# Patient Record
Sex: Female | Born: 1940
Health system: Southern US, Community
[De-identification: ages and names within clinical notes are randomized; demographics above are authoritative.]

## PROBLEM LIST (undated history)

## (undated) DIAGNOSIS — J189 Pneumonia, unspecified organism: Secondary | ICD-10-CM

## (undated) DIAGNOSIS — R011 Cardiac murmur, unspecified: Secondary | ICD-10-CM

## (undated) DIAGNOSIS — F419 Anxiety disorder, unspecified: Secondary | ICD-10-CM

## (undated) DIAGNOSIS — IMO0002 Reserved for concepts with insufficient information to code with codable children: Secondary | ICD-10-CM

## (undated) DIAGNOSIS — R200 Anesthesia of skin: Secondary | ICD-10-CM

## (undated) DIAGNOSIS — E785 Hyperlipidemia, unspecified: Secondary | ICD-10-CM

## (undated) DIAGNOSIS — K635 Polyp of colon: Secondary | ICD-10-CM

## (undated) DIAGNOSIS — F0390 Unspecified dementia without behavioral disturbance: Secondary | ICD-10-CM

## (undated) DIAGNOSIS — Q231 Congenital insufficiency of aortic valve: Secondary | ICD-10-CM

## (undated) DIAGNOSIS — H9190 Unspecified hearing loss, unspecified ear: Secondary | ICD-10-CM

## (undated) DIAGNOSIS — R42 Dizziness and giddiness: Secondary | ICD-10-CM

## (undated) DIAGNOSIS — C4491 Basal cell carcinoma of skin, unspecified: Secondary | ICD-10-CM

## (undated) DIAGNOSIS — I10 Essential (primary) hypertension: Secondary | ICD-10-CM

## (undated) DIAGNOSIS — M1990 Unspecified osteoarthritis, unspecified site: Secondary | ICD-10-CM

## (undated) DIAGNOSIS — Z8669 Personal history of other diseases of the nervous system and sense organs: Secondary | ICD-10-CM

## (undated) DIAGNOSIS — R002 Palpitations: Secondary | ICD-10-CM

## (undated) DIAGNOSIS — H269 Unspecified cataract: Secondary | ICD-10-CM

## (undated) DIAGNOSIS — Q2381 Bicuspid aortic valve: Secondary | ICD-10-CM

## (undated) DIAGNOSIS — H9319 Tinnitus, unspecified ear: Secondary | ICD-10-CM

## (undated) DIAGNOSIS — I351 Nonrheumatic aortic (valve) insufficiency: Secondary | ICD-10-CM

## (undated) DIAGNOSIS — K219 Gastro-esophageal reflux disease without esophagitis: Secondary | ICD-10-CM

## (undated) DIAGNOSIS — K52831 Collagenous colitis: Secondary | ICD-10-CM

## (undated) HISTORY — DX: Palpitations: R00.2

## (undated) HISTORY — PX: TUMOR REMOVAL: SHX12

## (undated) HISTORY — DX: Polyp of colon: K63.5

## (undated) HISTORY — DX: Anesthesia of skin: R20.0

## (undated) HISTORY — DX: Unspecified cataract: H26.9

## (undated) HISTORY — DX: Nonrheumatic aortic (valve) insufficiency: I35.1

## (undated) HISTORY — PX: THYROIDECTOMY, PARTIAL: SHX18

## (undated) HISTORY — DX: Hyperlipidemia, unspecified: E78.5

## (undated) HISTORY — DX: Basal cell carcinoma of skin, unspecified: C44.91

## (undated) HISTORY — DX: Personal history of other diseases of the nervous system and sense organs: Z86.69

## (undated) HISTORY — DX: Cardiac murmur, unspecified: R01.1

## (undated) HISTORY — DX: Tinnitus, unspecified ear: H93.19

## (undated) HISTORY — DX: Reserved for concepts with insufficient information to code with codable children: IMO0002

## (undated) HISTORY — DX: Unspecified hearing loss, unspecified ear: H91.90

## (undated) HISTORY — DX: Essential (primary) hypertension: I10

## (undated) HISTORY — PX: TONSILLECTOMY: SUR1361

## (undated) HISTORY — DX: Anxiety disorder, unspecified: F41.9

---

## 1969-06-23 HISTORY — PX: TUBAL LIGATION: SHX77

## 1998-11-11 ENCOUNTER — Ambulatory Visit (HOSPITAL_COMMUNITY): Admission: RE | Admit: 1998-11-11 | Discharge: 1998-11-11 | Payer: Self-pay | Admitting: Obstetrics and Gynecology

## 1999-12-01 ENCOUNTER — Encounter: Payer: Self-pay | Admitting: Obstetrics and Gynecology

## 1999-12-01 ENCOUNTER — Ambulatory Visit (HOSPITAL_COMMUNITY): Admission: RE | Admit: 1999-12-01 | Discharge: 1999-12-01 | Payer: Self-pay | Admitting: Obstetrics and Gynecology

## 2000-06-14 ENCOUNTER — Encounter: Payer: Self-pay | Admitting: Otolaryngology

## 2000-06-14 ENCOUNTER — Ambulatory Visit (HOSPITAL_COMMUNITY): Admission: RE | Admit: 2000-06-14 | Discharge: 2000-06-14 | Payer: Self-pay | Admitting: Otolaryngology

## 2000-08-20 ENCOUNTER — Other Ambulatory Visit: Admission: RE | Admit: 2000-08-20 | Discharge: 2000-08-20 | Payer: Self-pay | Admitting: Otolaryngology

## 2000-10-23 HISTORY — PX: DILATION AND CURETTAGE OF UTERUS: SHX78

## 2000-10-23 HISTORY — PX: HYSTEROSCOPY: SHX211

## 2001-01-02 ENCOUNTER — Other Ambulatory Visit: Admission: RE | Admit: 2001-01-02 | Discharge: 2001-01-02 | Payer: Self-pay | Admitting: Obstetrics and Gynecology

## 2001-01-09 ENCOUNTER — Encounter: Payer: Self-pay | Admitting: Obstetrics and Gynecology

## 2001-01-09 ENCOUNTER — Ambulatory Visit (HOSPITAL_COMMUNITY): Admission: RE | Admit: 2001-01-09 | Discharge: 2001-01-09 | Payer: Self-pay | Admitting: Obstetrics and Gynecology

## 2001-02-01 ENCOUNTER — Encounter: Payer: Self-pay | Admitting: Anesthesiology

## 2001-02-11 ENCOUNTER — Ambulatory Visit (HOSPITAL_COMMUNITY): Admission: RE | Admit: 2001-02-11 | Discharge: 2001-02-11 | Payer: Self-pay | Admitting: Obstetrics and Gynecology

## 2001-02-11 ENCOUNTER — Encounter (INDEPENDENT_AMBULATORY_CARE_PROVIDER_SITE_OTHER): Payer: Self-pay

## 2001-06-12 ENCOUNTER — Ambulatory Visit (HOSPITAL_COMMUNITY): Admission: RE | Admit: 2001-06-12 | Discharge: 2001-06-12 | Payer: Self-pay | Admitting: Gastroenterology

## 2001-06-12 ENCOUNTER — Encounter (INDEPENDENT_AMBULATORY_CARE_PROVIDER_SITE_OTHER): Payer: Self-pay | Admitting: Specialist

## 2002-01-30 ENCOUNTER — Other Ambulatory Visit: Admission: RE | Admit: 2002-01-30 | Discharge: 2002-01-30 | Payer: Self-pay | Admitting: Obstetrics and Gynecology

## 2002-11-26 ENCOUNTER — Encounter: Payer: Self-pay | Admitting: Internal Medicine

## 2002-11-26 ENCOUNTER — Encounter: Admission: RE | Admit: 2002-11-26 | Discharge: 2002-11-26 | Payer: Self-pay | Admitting: Internal Medicine

## 2002-12-05 ENCOUNTER — Ambulatory Visit (HOSPITAL_COMMUNITY): Admission: RE | Admit: 2002-12-05 | Discharge: 2002-12-05 | Payer: Self-pay | Admitting: Obstetrics and Gynecology

## 2002-12-05 ENCOUNTER — Encounter: Payer: Self-pay | Admitting: Obstetrics and Gynecology

## 2003-02-03 ENCOUNTER — Other Ambulatory Visit: Admission: RE | Admit: 2003-02-03 | Discharge: 2003-02-03 | Payer: Self-pay | Admitting: Obstetrics and Gynecology

## 2003-02-15 ENCOUNTER — Encounter: Payer: Self-pay | Admitting: Emergency Medicine

## 2003-02-15 ENCOUNTER — Emergency Department (HOSPITAL_COMMUNITY): Admission: EM | Admit: 2003-02-15 | Discharge: 2003-02-15 | Payer: Self-pay | Admitting: Emergency Medicine

## 2003-04-01 ENCOUNTER — Encounter: Payer: Self-pay | Admitting: Internal Medicine

## 2003-04-01 ENCOUNTER — Ambulatory Visit (HOSPITAL_COMMUNITY): Admission: RE | Admit: 2003-04-01 | Discharge: 2003-04-01 | Payer: Self-pay | Admitting: Internal Medicine

## 2004-01-05 ENCOUNTER — Ambulatory Visit (HOSPITAL_COMMUNITY): Admission: RE | Admit: 2004-01-05 | Discharge: 2004-01-05 | Payer: Self-pay | Admitting: Obstetrics and Gynecology

## 2004-03-09 ENCOUNTER — Other Ambulatory Visit: Admission: RE | Admit: 2004-03-09 | Discharge: 2004-03-09 | Payer: Self-pay | Admitting: Obstetrics and Gynecology

## 2004-10-23 HISTORY — PX: HYSTEROSCOPY: SHX211

## 2005-01-05 ENCOUNTER — Ambulatory Visit (HOSPITAL_COMMUNITY): Admission: RE | Admit: 2005-01-05 | Discharge: 2005-01-05 | Payer: Self-pay | Admitting: Obstetrics and Gynecology

## 2005-03-21 ENCOUNTER — Ambulatory Visit (HOSPITAL_COMMUNITY): Admission: RE | Admit: 2005-03-21 | Discharge: 2005-03-21 | Payer: Self-pay | Admitting: Cardiovascular Disease

## 2005-03-21 ENCOUNTER — Encounter: Payer: Self-pay | Admitting: Cardiovascular Disease

## 2005-03-24 ENCOUNTER — Other Ambulatory Visit: Admission: RE | Admit: 2005-03-24 | Discharge: 2005-03-24 | Payer: Self-pay | Admitting: Obstetrics and Gynecology

## 2005-06-05 ENCOUNTER — Ambulatory Visit (HOSPITAL_BASED_OUTPATIENT_CLINIC_OR_DEPARTMENT_OTHER): Admission: RE | Admit: 2005-06-05 | Discharge: 2005-06-05 | Payer: Self-pay | Admitting: Obstetrics and Gynecology

## 2005-06-05 ENCOUNTER — Encounter (INDEPENDENT_AMBULATORY_CARE_PROVIDER_SITE_OTHER): Payer: Self-pay | Admitting: Specialist

## 2005-06-05 ENCOUNTER — Ambulatory Visit (HOSPITAL_COMMUNITY): Admission: RE | Admit: 2005-06-05 | Discharge: 2005-06-05 | Payer: Self-pay | Admitting: Obstetrics and Gynecology

## 2005-12-07 ENCOUNTER — Encounter: Admission: RE | Admit: 2005-12-07 | Discharge: 2005-12-07 | Payer: Self-pay | Admitting: Obstetrics and Gynecology

## 2006-01-08 ENCOUNTER — Encounter: Admission: RE | Admit: 2006-01-08 | Discharge: 2006-01-08 | Payer: Self-pay | Admitting: Obstetrics and Gynecology

## 2006-04-10 ENCOUNTER — Other Ambulatory Visit: Admission: RE | Admit: 2006-04-10 | Discharge: 2006-04-10 | Payer: Self-pay | Admitting: Obstetrics & Gynecology

## 2007-03-07 ENCOUNTER — Encounter: Admission: RE | Admit: 2007-03-07 | Discharge: 2007-03-07 | Payer: Self-pay | Admitting: Obstetrics and Gynecology

## 2007-04-24 ENCOUNTER — Other Ambulatory Visit: Admission: RE | Admit: 2007-04-24 | Discharge: 2007-04-24 | Payer: Self-pay | Admitting: Obstetrics and Gynecology

## 2008-04-01 ENCOUNTER — Encounter: Admission: RE | Admit: 2008-04-01 | Discharge: 2008-04-01 | Payer: Self-pay | Admitting: Obstetrics and Gynecology

## 2008-05-15 ENCOUNTER — Other Ambulatory Visit: Admission: RE | Admit: 2008-05-15 | Discharge: 2008-05-15 | Payer: Self-pay | Admitting: Obstetrics and Gynecology

## 2009-04-14 ENCOUNTER — Encounter: Admission: RE | Admit: 2009-04-14 | Discharge: 2009-04-14 | Payer: Self-pay | Admitting: Obstetrics and Gynecology

## 2009-12-21 DIAGNOSIS — IMO0002 Reserved for concepts with insufficient information to code with codable children: Secondary | ICD-10-CM

## 2009-12-21 HISTORY — DX: Reserved for concepts with insufficient information to code with codable children: IMO0002

## 2010-01-10 ENCOUNTER — Ambulatory Visit (HOSPITAL_COMMUNITY): Admission: RE | Admit: 2010-01-10 | Discharge: 2010-01-10 | Payer: Self-pay | Admitting: Internal Medicine

## 2010-04-14 ENCOUNTER — Encounter: Admission: RE | Admit: 2010-04-14 | Discharge: 2010-04-14 | Payer: Self-pay | Admitting: Obstetrics and Gynecology

## 2010-07-11 ENCOUNTER — Encounter: Admission: RE | Admit: 2010-07-11 | Discharge: 2010-07-11 | Payer: Self-pay | Admitting: Diagnostic Neuroimaging

## 2010-07-12 ENCOUNTER — Encounter: Admission: RE | Admit: 2010-07-12 | Discharge: 2010-07-12 | Payer: Self-pay | Admitting: Neurosurgery

## 2010-11-16 ENCOUNTER — Ambulatory Visit: Payer: Self-pay | Admitting: Cardiovascular Disease

## 2011-01-16 ENCOUNTER — Telehealth: Payer: Self-pay | Admitting: *Deleted

## 2011-01-16 NOTE — Telephone Encounter (Signed)
LOWER EXTREMITY EDEMA, SOB x 3 DAYS.  HAS QUESTIONS ABOUT MEDICATION.

## 2011-01-16 NOTE — Telephone Encounter (Signed)
ERROR

## 2011-01-16 NOTE — Telephone Encounter (Signed)
It certainly could be the Amlodipine.  Will she try Losartan 50 mg a day and see if that works better.  Hold Amlodipine for 4-5 days to see if symptoms clear.

## 2011-01-16 NOTE — Telephone Encounter (Signed)
Pt was switched from lisinolpril to norvasc on last visit. Currently having night and day sweats, ankle edema bilateral but right worse than left. Weight is 112 up two lbs from usual, denies sob. Pt feels shaky and just not feeling well. Do you feel it is medication related?

## 2011-01-20 ENCOUNTER — Telehealth: Payer: Self-pay | Admitting: Cardiovascular Disease

## 2011-01-20 NOTE — Telephone Encounter (Signed)
PT STOPPED AMLODIPINE FOR ONE WEEK; NIGHT SWEATS BETTER LEFT FOOT NOT SWOLLEN BUT RIGHT FOOT REMAINS SWOLLEN. PT IS MAKING FOLLOW UP WITH HER PCP AND DECLINES STARTING LOSARTAN 50MG  CURRENTLY. WILL DISCUSS W/ PCP IF TO START. PT TO MAKE APP TODAY AND VERBALIS ED UNDERSTANDING. Alfonso Ramus RN

## 2011-03-10 NOTE — Op Note (Signed)
[facility]  Patient:    Bailey [name], Bailey [name]                     MRN: [id number] Proc. Date: 02/11/01 Adm. Date:  [id number] Attending:  Brynda Peon                           Operative Report  PREOPERATIVE DIAGNOSES:  Postmenopausal bleeding, known submucosal myoma.  POSTOPERATIVE DIAGNOSES:  Postmenopausal bleeding, known submucosal myoma.  PATHOLOGY:  Pending.  PROCEDURE:  Hysteroscopy, removal of submucous myoma and D&C.  SURGEON:  Dr. Amedeo Kinsman.  ANESTHESIA:  General by LMA.  ESTIMATED BLOOD LOSS:  Minimal.  FLUID DEFICIT:  60 cc.  COMPLICATIONS:  None.  DESCRIPTION OF PROCEDURE:  The patient was taken to the operating room and after the induction of adequate general anesthesia by LMA was placed in the dorsal lithotomy position and prepped and draped in the usual fashion and Foley catheter and red rubber catheter inserted and removed. The cervix was grasped at the anterior lip with a single tooth tenaculum. The uterus sounded to 8.5 cm. The cervix was dilated serially with Shawnie Pons dilators, #31 Shawnie Pons. The operative resectoscope was inserted, sorbitol was used as a distention medium. The fluid pressure was set on 70 mm. There was an anterior intrauterine mass consistent with a myoma. This was removed with the resectoscope and there were several other polypoid structures that were also removed with the resectoscope. There was some indentation into the endometrium anteriorly with an apparent intramural myoma. This was not removed. Gentle curettage was carried out and a specimen sent to pathology. Hysteroscopy was repeated. There was no significant bleeding in the cavity. It appeared clean and the procedure was terminated. The resectoscope was removed. The tenaculum was removed from the cervix and the procedure was terminated. The patient tolerated the procedure well and went in fasted condition to post anesthesia recovery. DD:   02/11/01 TD:  02/12/01 Job: [id number]

## 2011-03-10 NOTE — Procedures (Signed)
[facility]  Patient:    Bailey [name], Bailey [name] Visit Number: [id number] MRN: [id number]          Service Type: END Location: ENDO Attending Physician:  Rich Brave Proc. Date: 06/12/01 Adm. Date:  06/12/2001   CC:         Rodrigo Ran, M.D.   Procedure Report  PROCEDURE:  Colonoscopy with biopsy.  INDICATION:  This is a 70 year old female with a family history of colorectal neoplasia (precancerous polyp in her brother), who had a normal screening colonoscopy by me approximately six years ago.  FINDINGS:  Significant sigmoid diverticulosis and fixation.  Diminutive proximal colon polyp removed by cold biopsy technique.  DESCRIPTION OF PROCEDURE:  The nature, purpose, and risks of the procedure were familiar to the patient from prior examination.  She provided written consent.  Sedation was fentanyl 75 mcg and Versed 7 mg IV without arrhythmias or desaturation.  The Olympus adult video colonoscope was used for this procedure, but it was difficult to negotiate the sigmoid region, so the adjustable-tension pediatric scope might be better for future use.  It was advanced through a very fixated, angulated sigmoid region, associated with quite a bit of diverticular disease and muscular thickening, and then fairly easily the remainder of the way around the colon, taking out loops as we advanced, with the patient in the supine position through almost the entire exam.  The cecum was identified by visualization of the appendiceal orifice, and pullback was then performed. The quality of the prep was excellent, and it is felt that all areas were well seen.  There was a small, 2-3 mm sessile polyp in the ascending colon removed by two cold biopsies.  No other polyps were seen, and there was no evidence of cancer, colitis, or vascular malformations.  There was significant sigmoid diverticulosis with associated fixation, as noted above.  Retroflexion  could not be accomplished in the rectum, but antegrade viewing disclosed no distal rectal lesions.  The patient tolerated the procedure well, and there were no apparent complications.  IMPRESSION: 1. Solitary diminutive sessile polyp removed by cold biopsy technique. 2. Significant sigmoid fixation and associated diverticular disease.  PLAN:  Await pathology on the polyp but, in view of the patients family history, [name] feel follow-up colonoscopy is warranted in five years regardless of the polyp histology. Attending Physician:  Rich Brave DD:  06/12/01 TD:  06/12/01 Job: [phone]

## 2011-03-10 NOTE — Op Note (Signed)
NAMESHERRYN, POLLINO              ACCOUNT NO.:  [id number]   MEDICAL RECORD NO.:  [id number]          PATIENT TYPE:  AMB   LOCATION:  [facility]                         FACILITY:  [facility]   PHYSICIAN:  Cynthia P. Romine, M.D.DATE OF BIRTH:  April 27, 1941   DATE OF PROCEDURE:  06/05/2005  DATE OF DISCHARGE:                                 OPERATIVE REPORT   PREOPERATIVE DIAGNOSIS:  Post menopausal bleeding, endometrial polyps.   POSTOPERATIVE DIAGNOSIS:  Post menopausal bleeding, endometrial polyps.  Path pending.   PROCEDURE:  Hysteroscopic resection of endometrial polyps.  C&C.   SURGEON:  Cynthia P. Romine, M.D.   ANESTHESIA:  General by LMA.   ESTIMATED BLOOD LOSS:  Minimal.   SORBITOL DEFICIT:  30 mL.   COMPLICATIONS:  None.   PROCEDURE:  The patient was taken to the operating room and after the  induction of adequate general anesthesia was placed in the dorsal lithotomy  position and prepped and draped in the usual fashion.  The bladder was  drained with a red rubber catheter.  The posterior weighted and anterior  Sims retractors were placed, and the cervix was grasped on its anterior lip  with a single-tooth tenaculum.  The sound would not pass easily.  Therefore,  the 11 Shawnie Pons was lubricated with some K-Y jelly and the cavity was explored  and it did pass.  The cavity was retroverted and approximately 8 cm.  The  cervix was then dilated sequentially to a #31 Pratt.  The operative  hysteroscope was introduced.  Sorbitol was used as the distention medium.  The pressure pump was set on 70 mmHg.  Hysteroscopy revealed there to be an  anterior fundal polyp.  The rest of the cavity appeared clear.  The single  loop was used to excise the polyp in several passes.  The scope was  withdrawn.  Sharp curettage was then carried out.  The specimen was sent to  pathology, and the procedure was terminated.  The instruments removed from  the vagina. The patient was taken to recovery room in  satisfactory  condition.       CPR/MEDQ  D:  06/05/2005  T:  06/05/2005  Job:  [id number]

## 2011-04-03 ENCOUNTER — Other Ambulatory Visit: Payer: Self-pay | Admitting: Obstetrics and Gynecology

## 2011-04-03 DIAGNOSIS — Z1231 Encounter for screening mammogram for malignant neoplasm of breast: Secondary | ICD-10-CM

## 2011-04-17 ENCOUNTER — Ambulatory Visit
Admission: RE | Admit: 2011-04-17 | Discharge: 2011-04-17 | Disposition: A | Discharge status: Home / Self Care | Payer: PRIVATE HEALTH INSURANCE | Source: Ambulatory Visit | Attending: Obstetrics and Gynecology | Admitting: Obstetrics and Gynecology

## 2011-04-17 DIAGNOSIS — Z1231 Encounter for screening mammogram for malignant neoplasm of breast: Secondary | ICD-10-CM

## 2012-01-15 ENCOUNTER — Encounter: Payer: Self-pay | Admitting: *Deleted

## 2012-02-13 ENCOUNTER — Other Ambulatory Visit: Payer: Self-pay | Admitting: Gastroenterology

## 2012-02-27 ENCOUNTER — Ambulatory Visit (INDEPENDENT_AMBULATORY_CARE_PROVIDER_SITE_OTHER): Payer: Medicare Other | Admitting: Cardiovascular Disease

## 2012-02-27 ENCOUNTER — Encounter: Payer: Self-pay | Admitting: Cardiovascular Disease

## 2012-02-27 VITALS — BP 144/79 | HR 61 | Ht 63.5 in | Wt 112.1 lb

## 2012-02-27 DIAGNOSIS — I10 Essential (primary) hypertension: Secondary | ICD-10-CM

## 2012-02-27 DIAGNOSIS — Q2381 Bicuspid aortic valve: Secondary | ICD-10-CM

## 2012-02-27 DIAGNOSIS — Q231 Congenital insufficiency of aortic valve: Secondary | ICD-10-CM

## 2012-02-27 DIAGNOSIS — E785 Hyperlipidemia, unspecified: Secondary | ICD-10-CM | POA: Insufficient documentation

## 2012-02-27 MED ORDER — HYDROCHLOROTHIAZIDE 25 MG PO TABS: 25.0000 mg | ORAL_TABLET | Freq: Every day | ORAL | Status: DC

## 2012-02-27 MED ORDER — POTASSIUM CHLORIDE CRYS ER 10 MEQ PO TBCR: 10.0000 meq | EXTENDED_RELEASE_TABLET | Freq: Every day | ORAL | Status: DC

## 2012-02-27 NOTE — Assessment & Plan Note (Signed)
She's currently on atorvastatin. Her levels have been followed by Dr. Waynard Edwards. She'll bring her labs with her at her next visit.

## 2012-02-27 NOTE — Assessment & Plan Note (Signed)
Bailey [name]'s Blood pressure is mildly elevated. We will change her Lasix to HCTZ 25 mg a day. We will also add potassium chloride 10 mEq a day. We'll recheck her labs again in one month.

## 2012-02-27 NOTE — Patient Instructions (Addendum)
Your physician wants you to follow-up in: 1 year You will receive a reminder letter in the mail two months in advance. If you don't receive a letter, please call our office to schedule the follow-up appointment.  Your physician recommends that you return for lab work in: 1 month/ BMET  Your physician has requested that you have an echocardiogram. Echocardiography is a painless test that uses sound waves to create images of your heart. It provides your doctor with information about the size and shape of your heart and how well your heart's chambers and valves are working. This procedure takes approximately one hour. There are no restrictions for this procedure.   Your physician has recommended you make the following change in your medication:   STOP LASIX START HCTZ 25MG  DAILY START POTASSIUM/KDUR 10 MEQ DAILY WITH THE HCTZ

## 2012-02-27 NOTE — Progress Notes (Signed)
    Bailey [name] Date of Birth  Jun 25, 1941       [facility]    [place] [address]  [address] [place], [place]  [address]   [place], [place]  [address] [phone]     [phone]   Fax  [phone]    Fax [phone]  Problem List: 1. Bicuspid aortic valve / moderate aortic insufficiency 2. Dyslipidemia 3. Hypertension  History of Present Illness:  Bailey [name] is a 71 y.o. female with the above medical problems.  She has had chronic diarrhea and was found to have colitis. She is taking some medications and is getting better.  She is exercising some.  She has been spending a lot of time at the nursing home taking care of her mother. She's not been able to exercise as much she would like.  She's not had any cardiac problems doing her normal daily activities.    Current Outpatient Prescriptions on File Prior to Visit  Medication Sig Dispense Refill  . atorvastatin (LIPITOR) 10 MG tablet Take 10 mg by mouth daily.       . furosemide (LASIX) 20 MG tablet Take 20 mg by mouth daily.      . multivitamin (THERAGRAN) per tablet Take 1 tablet by mouth daily.      Marland Kitchen olmesartan (BENICAR) 40 MG tablet Take 40 mg by mouth daily.        Allergies  Allergen Reactions  . Ibuprofen Nausea And Vomiting    Past Medical History  Diagnosis Date  . Hypertension   . Hyperlipidemia   . Aortic insufficiency     Past Surgical History  Procedure Date  . Tubal ligation   . 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 Never Smoker   Smokeless tobacco  . Not on file    History  Alcohol Use No    Family History  Problem Relation Age of Onset  . Arrhythmia Mother   . Bone cancer Father     Reviw of Systems:  Reviewed in the HPI.  All other systems are negative.  Physical Exam: Blood pressure 144/79, pulse 61, height 5' 3.5" (1.613 m), weight 112 lb 1.9 oz (50.857 kg). General: Well developed, well  nourished, in no acute distress.  Head: Normocephalic, atraumatic, sclera non-icteric, mucus membranes are moist,   Neck: Supple. Carotids are 2 + without bruits. No JVD  Lungs: Clear bilaterally to auscultation.  Heart: regular rate.  normal  S1 S2.  There is a 1-2/6 systolic murmur at the LSB.  Abdomen: Soft, non-tender, non-distended with normal bowel sounds. No hepatomegaly. No rebound/guarding. Her abdominal aorta is palpable in her midline.  Msk:  Strength and tone are normal  Extremities: No clubbing or cyanosis. No edema.  Distal pedal pulses are 2+ and equal bilaterally.  Neuro: Alert and oriented X 3. Moves all extremities spontaneously.  Psych:  Responds to questions appropriately with a normal affect.  ECG: Feb 27, 2012-normal sinus rhythm at 64 beats a minute. She has an incomplete right bundle branch block. She has poor R-wave progression that is likely due to lead placement.  Assessment / Plan:

## 2012-02-27 NOTE — Assessment & Plan Note (Addendum)
Bailey [name] has a bicuspid aortic valve. We diagnosed this by echocardiogram in 2006. She has at least mild to moderate aortic insufficiency.  We will repeat her echocardiogram. She's completely asymptomatic. I'll see her again in one year.  We have performed an abdominal ultrasound in the past. I do not have that report but it did not show any evidence of aneurysm. I suspect that her palpable abdominal aorta is just because she is so thin.

## 2012-03-01 ENCOUNTER — Other Ambulatory Visit: Payer: Self-pay

## 2012-03-01 ENCOUNTER — Ambulatory Visit (HOSPITAL_COMMUNITY): Payer: Medicare Other | Attending: Cardiovascular Disease

## 2012-03-01 DIAGNOSIS — I059 Rheumatic mitral valve disease, unspecified: Secondary | ICD-10-CM | POA: Insufficient documentation

## 2012-03-01 DIAGNOSIS — Q231 Congenital insufficiency of aortic valve: Secondary | ICD-10-CM | POA: Insufficient documentation

## 2012-03-01 DIAGNOSIS — I359 Nonrheumatic aortic valve disorder, unspecified: Secondary | ICD-10-CM | POA: Insufficient documentation

## 2012-03-01 DIAGNOSIS — E785 Hyperlipidemia, unspecified: Secondary | ICD-10-CM | POA: Insufficient documentation

## 2012-03-01 DIAGNOSIS — I10 Essential (primary) hypertension: Secondary | ICD-10-CM | POA: Insufficient documentation

## 2012-03-01 DIAGNOSIS — Q2381 Bicuspid aortic valve: Secondary | ICD-10-CM

## 2012-03-13 ENCOUNTER — Emergency Department (HOSPITAL_COMMUNITY): Payer: Medicare Other

## 2012-03-13 ENCOUNTER — Inpatient Hospital Stay (HOSPITAL_COMMUNITY)
Admission: EM | Admit: 2012-03-13 | Discharge: 2012-03-15 | DRG: 149 | Disposition: A | Discharge status: Home / Self Care | Payer: Medicare Other | Attending: Internal Medicine | Admitting: Internal Medicine

## 2012-03-13 ENCOUNTER — Encounter (HOSPITAL_COMMUNITY): Payer: Self-pay | Admitting: *Deleted

## 2012-03-13 DIAGNOSIS — G43909 Migraine, unspecified, not intractable, without status migrainosus: Secondary | ICD-10-CM | POA: Diagnosis present

## 2012-03-13 DIAGNOSIS — N179 Acute kidney failure, unspecified: Secondary | ICD-10-CM | POA: Diagnosis present

## 2012-03-13 DIAGNOSIS — I10 Essential (primary) hypertension: Secondary | ICD-10-CM | POA: Diagnosis present

## 2012-03-13 DIAGNOSIS — E785 Hyperlipidemia, unspecified: Secondary | ICD-10-CM | POA: Diagnosis present

## 2012-03-13 DIAGNOSIS — R42 Dizziness and giddiness: Principal | ICD-10-CM | POA: Diagnosis present

## 2012-03-13 DIAGNOSIS — Q231 Congenital insufficiency of aortic valve: Secondary | ICD-10-CM

## 2012-03-13 HISTORY — DX: Congenital insufficiency of aortic valve: Q23.1

## 2012-03-13 HISTORY — DX: Dizziness and giddiness: R42

## 2012-03-13 HISTORY — DX: Bicuspid aortic valve: Q23.81

## 2012-03-13 LAB — DIFFERENTIAL
Basophils Absolute: 0 10*3/uL (ref 0.0–0.1)
Basophils Relative: 0 % (ref 0–1)
Eosinophils Relative: 2 % (ref 0–5)
Lymphocytes Relative: 19 % (ref 12–46)
Neutro Abs: 6.3 10*3/uL (ref 1.7–7.7)

## 2012-03-13 LAB — CBC
MCHC: 34 g/dL (ref 30.0–36.0)
Platelets: 153 10*3/uL (ref 150–400)
RDW: 13 % (ref 11.5–15.5)
WBC: 9 10*3/uL (ref 4.0–10.5)

## 2012-03-13 LAB — POCT I-STAT, CHEM 8
BUN: 24 mg/dL — ABNORMAL HIGH (ref 6–23)
Chloride: 102 mEq/L (ref 96–112)
HCT: 32 % — ABNORMAL LOW (ref 36.0–46.0)
Sodium: 140 mEq/L (ref 135–145)
TCO2: 29 mmol/L (ref 0–100)

## 2012-03-13 MED ORDER — ONDANSETRON HCL 4 MG/2ML IJ SOLN
INTRAMUSCULAR | Status: AC
  Administered 2012-03-13: 22:00:00
  Filled 2012-03-13: qty 2

## 2012-03-13 MED ORDER — DIAZEPAM 5 MG/ML IJ SOLN
5.0000 mg | Freq: Once | INTRAMUSCULAR | Status: AC
  Administered 2012-03-13: 5 mg via INTRAVENOUS
  Filled 2012-03-13: qty 2

## 2012-03-13 NOTE — ED Notes (Signed)
Pt now c/o RLQ abdominal pain, does not increase with palpation.  Denies nausea at this time, no painful urination or burning.  Diarrhea for 3 months with hx of colitis.

## 2012-03-13 NOTE — ED Notes (Signed)
Per EMS: pt was dizzy and vomited 1 time yesterday.  Tonight, pt felt very dizzy and vomited x 2.  Pt is hypertensive with hx of HTN.  Also has hx of vertigo, states this feels slightly different.  4 mg Zofran given.  Vomiting with movement.

## 2012-03-13 NOTE — ED Notes (Signed)
Pt ambulated with 1 staff assist.  No nausea, but pt did feel dizzy when ambulating, needed to "catch my balance" several times.

## 2012-03-14 ENCOUNTER — Encounter (HOSPITAL_COMMUNITY): Payer: Self-pay | Admitting: Family Medicine

## 2012-03-14 ENCOUNTER — Inpatient Hospital Stay (HOSPITAL_COMMUNITY): Payer: Medicare Other

## 2012-03-14 DIAGNOSIS — G43909 Migraine, unspecified, not intractable, without status migrainosus: Secondary | ICD-10-CM

## 2012-03-14 DIAGNOSIS — E782 Mixed hyperlipidemia: Secondary | ICD-10-CM

## 2012-03-14 DIAGNOSIS — R42 Dizziness and giddiness: Secondary | ICD-10-CM | POA: Diagnosis present

## 2012-03-14 DIAGNOSIS — I10 Essential (primary) hypertension: Secondary | ICD-10-CM

## 2012-03-14 LAB — URINALYSIS, ROUTINE W REFLEX MICROSCOPIC
Glucose, UA: NEGATIVE mg/dL
Ketones, ur: NEGATIVE mg/dL
Leukocytes, UA: NEGATIVE
Nitrite: NEGATIVE
Protein, ur: NEGATIVE mg/dL

## 2012-03-14 LAB — CBC
HCT: 34.3 % — ABNORMAL LOW (ref 36.0–46.0)
HCT: 33.1 % — ABNORMAL LOW (ref 36.0–46.0)
Hemoglobin: 11.4 g/dL — ABNORMAL LOW (ref 12.0–15.0)
MCHC: 33.5 g/dL (ref 30.0–36.0)
MCV: 90.7 fL (ref 78.0–100.0)
Platelets: 149 10*3/uL — ABNORMAL LOW (ref 150–400)
RBC: 3.65 MIL/uL — ABNORMAL LOW (ref 3.87–5.11)
RDW: 12.9 % (ref 11.5–15.5)
RDW: 12.9 % (ref 11.5–15.5)
WBC: 7.3 10*3/uL (ref 4.0–10.5)
WBC: 6.9 10*3/uL (ref 4.0–10.5)

## 2012-03-14 LAB — BASIC METABOLIC PANEL
CO2: 27 mEq/L (ref 19–32)
Chloride: 102 mEq/L (ref 96–112)
Creatinine, Ser: 0.73 mg/dL (ref 0.50–1.10)

## 2012-03-14 LAB — CREATININE, SERUM
Creatinine, Ser: 0.85 mg/dL (ref 0.50–1.10)
GFR calc Af Amer: 79 mL/min — ABNORMAL LOW (ref >90)
GFR calc non Af Amer: 68 mL/min — ABNORMAL LOW (ref >90)

## 2012-03-14 MED ORDER — MECLIZINE HCL 25 MG PO TABS
25.0000 mg | ORAL_TABLET | Freq: Three times a day (TID) | ORAL | Status: DC | PRN
  Filled 2012-03-14: qty 1

## 2012-03-14 MED ORDER — IRBESARTAN 150 MG PO TABS
150.0000 mg | ORAL_TABLET | Freq: Every day | ORAL | Status: DC
  Administered 2012-03-14: 150 mg via ORAL
  Filled 2012-03-14: qty 1

## 2012-03-14 MED ORDER — ALUM & MAG HYDROXIDE-SIMETH 200-200-20 MG/5ML PO SUSP: 30.0000 mL | Freq: Four times a day (QID) | ORAL | Status: DC | PRN

## 2012-03-14 MED ORDER — AMLODIPINE BESYLATE 5 MG PO TABS
5.0000 mg | ORAL_TABLET | Freq: Every day | ORAL | Status: DC
  Administered 2012-03-14 – 2012-03-15 (×2): 5 mg via ORAL
  Filled 2012-03-14 (×2): qty 1

## 2012-03-14 MED ORDER — ENOXAPARIN SODIUM 30 MG/0.3ML ~~LOC~~ SOLN
30.0000 mg | Freq: Every day | SUBCUTANEOUS | Status: DC
  Administered 2012-03-14: 30 mg via SUBCUTANEOUS
  Filled 2012-03-14 (×2): qty 0.3

## 2012-03-14 MED ORDER — ACETAMINOPHEN 650 MG RE SUPP: 650.0000 mg | Freq: Four times a day (QID) | RECTAL | Status: DC | PRN

## 2012-03-14 MED ORDER — HYDROCODONE-ACETAMINOPHEN 5-325 MG PO TABS
1.0000 | ORAL_TABLET | ORAL | Status: DC | PRN
  Administered 2012-03-14: 1 via ORAL
  Filled 2012-03-14: qty 1

## 2012-03-14 MED ORDER — IRBESARTAN 150 MG PO TABS
150.0000 mg | ORAL_TABLET | Freq: Every day | ORAL | Status: DC
  Administered 2012-03-14 – 2012-03-15 (×2): 150 mg via ORAL
  Filled 2012-03-14 (×2): qty 1

## 2012-03-14 MED ORDER — ONDANSETRON HCL 4 MG PO TABS: 4.0000 mg | ORAL_TABLET | Freq: Four times a day (QID) | ORAL | Status: DC | PRN

## 2012-03-14 MED ORDER — POTASSIUM CHLORIDE ER 10 MEQ PO TBCR
10.0000 meq | EXTENDED_RELEASE_TABLET | Freq: Every day | ORAL | Status: DC
  Administered 2012-03-14 – 2012-03-15 (×2): 10 meq via ORAL
  Filled 2012-03-14 (×2): qty 1

## 2012-03-14 MED ORDER — SODIUM CHLORIDE 0.9 % IJ SOLN
3.0000 mL | Freq: Two times a day (BID) | INTRAMUSCULAR | Status: DC
  Administered 2012-03-14 (×2): 3 mL via INTRAVENOUS

## 2012-03-14 MED ORDER — SODIUM CHLORIDE 0.9 % IV BOLUS (SEPSIS)
500.0000 mL | Freq: Once | INTRAVENOUS | Status: AC
  Administered 2012-03-14: 500 mL via INTRAVENOUS

## 2012-03-14 MED ORDER — GADOBENATE DIMEGLUMINE 529 MG/ML IV SOLN
12.0000 mL | Freq: Once | INTRAVENOUS | Status: AC
  Administered 2012-03-14: 12 mL via INTRAVENOUS

## 2012-03-14 MED ORDER — ACETAMINOPHEN 325 MG PO TABS
650.0000 mg | ORAL_TABLET | Freq: Four times a day (QID) | ORAL | Status: DC | PRN
Start: 2012-03-14 — End: 2012-03-15
  Administered 2012-03-14: 650 mg via ORAL
  Filled 2012-03-14: qty 2

## 2012-03-14 MED ORDER — ONDANSETRON HCL 4 MG/2ML IJ SOLN: 4.0000 mg | Freq: Four times a day (QID) | INTRAMUSCULAR | Status: DC | PRN

## 2012-03-14 MED ORDER — POTASSIUM CHLORIDE IN NACL 20-0.9 MEQ/L-% IV SOLN
INTRAVENOUS | Status: DC
  Administered 2012-03-14: 04:00:00 via INTRAVENOUS
  Filled 2012-03-14 (×2): qty 1000

## 2012-03-14 NOTE — Progress Notes (Signed)
Patients home medications  brought to pharmacy. Receipt for home medications placed in the chart.

## 2012-03-14 NOTE — ED Notes (Signed)
Patient's husband contact numbers: Home: [phone]  Cell: [phone]

## 2012-03-14 NOTE — Evaluation (Signed)
Physical Therapy Evaluation  (Vestibular assessment) Patient Details Name: Bailey [name] MRN: [id number] DOB: 02/13/1941 Today's Date: 03/14/2012 Time: 1191-4782 PT Time Calculation (min): 49 min  PT Assessment / Plan / Recommendation Clinical Impression  Concentrated on vesibular assessment.  Answers to subjective questioning did not suggest solely positional vertigo or hypofunctioning  Occulomotor exam showed smooth pusuits, saccades and neg. VOR/ head thrusts..  Completed Hallpike-Dix both L/R.  Noted about 5 secs of torsional nystagmus to the L.  Treated with Epply.  Hallpike to R was negative.  Supine head roll for Horizontal BPPV negative.  Other differential would be viral cause due to having sore throat/cold recently.  I hope that s/s will disappear soon without further intervention.  Otherwise, I have asked the pt. to seek a script to see a Vestibular therapist at [facility].  D/C at this time from Acute PT.    PT Assessment  Patent does not need any further PT services    Follow Up Recommendations  No PT follow up;Other (comment) (unless s/s persist and this rec OPPT (vestibular))    Barriers to Discharge        lEquipment Recommendations  None recommended by PT    Recommendations for Other Services     Frequency      Precautions / Restrictions     Pertinent Vitals/Pain       Mobility  Bed Mobility Bed Mobility: Supine to Sit;Sitting - Scoot to Edge of Bed;Sit to Supine Supine to Sit: 7: Independent Sitting - Scoot to Edge of Bed: 7: Independent Sit to Supine: 7: Independent Transfers Transfers: Sit to Stand;Stand to Sit Sit to Stand: 7: Independent Stand to Sit: 7: Independent Ambulation/Gait Ambulation/Gait Assistance: 7: Independent Ambulation Distance (Feet): 130 Feet Assistive device: None Ambulation/Gait Assistance Details: generally steady, but wanders opposite direction when she scans her environment Gait Pattern: Within Functional Limits    Exercises      PT Diagnosis:    PT Problem List:   PT Treatment Interventions:     PT Goals    Visit Information  Last PT Received On: 03/14/12 Assistance Needed: +1    Subjective Data  Subjective: I have to describe it as dizziness Patient Stated Goal: just feel better   Prior Functioning  Home Living Lives With: Spouse Available Help at Discharge: Family Prior Function Level of Independence: Independent Able to Take Stairs?: Yes    Cognition  Arousal/Alertness: Awake/alert Orientation Level: Oriented X4 / Intact Behavior During Session: [facility] for tasks performed    Extremity/Trunk Assessment Right Upper Extremity Assessment RUE ROM/Strength/Tone: Within functional levels Left Upper Extremity Assessment LUE ROM/Strength/Tone: Within functional levels Right Lower Extremity Assessment RLE ROM/Strength/Tone: Within functional levels Left Lower Extremity Assessment LLE ROM/Strength/Tone: Within functional levels Trunk Assessment Trunk Assessment: Normal   Balance Balance Balance Assessed: No  End of Session PT - End of Session Activity Tolerance: Patient tolerated treatment well Patient left: in bed;with family/visitor present;with call bell/phone within reach Nurse Communication: Mobility status   Felicita Nuncio, Eliseo Gum 03/14/2012, 5:18 PM5/23/2013  [place], PT [phone] (pager)

## 2012-03-14 NOTE — Care Management Note (Signed)
    Page 1 of 1   03/15/2012     11:22:46 AM   CARE MANAGEMENT NOTE 03/15/2012  Patient:  Bailey [name], Bailey [name]   Account Number:  [id number]  Date Initiated:  03/14/2012  Documentation initiated by:  Letha Cape  Subjective/Objective Assessment:   dx dizziness, hx aortic insuff  admit- lives with spouse.  pta independent.     Action/Plan:   pt eval- no pt needs   Anticipated DC Date:  03/15/2012   Anticipated DC Plan:  HOME/SELF CARE      DC Planning Services  CM consult      Choice offered to / List presented to:             Status of service:  Completed, signed off Medicare Important Message given?   (If response is "NO", the following Medicare IM given date fields will be blank) Date Medicare IM given:   Date Additional Medicare IM given:    Discharge Disposition:  HOME/SELF CARE  Per UR Regulation:  Reviewed for med. necessity/level of care/duration of stay  If discussed at Long Length of Stay Meetings, dates discussed:    Comments:  PCP Dr. Waynard Edwards  03/15/12 11:21 Letha Cape RN, BSN [phone] patient dc to home today, no needs identified.  03/14/12 16:27 Letha Cape RN, BSN [phone] patient lives with spoude, pta independent.  Await pt eval. Patient has medication coverage and transportation.  NCM will continue to follow for dc needs.

## 2012-03-14 NOTE — Progress Notes (Signed)
PATIENT DETAILS Name: Bailey [name] Age: 71 y.o. Sex: female Date of Birth: [birth year] Admit Date: 03/13/2012 AVW:UJWJXB,JYNW A, MD, MD  Subjective: Vertigo better-able to ambulate to the bathroom  Objective: Vital signs in last 24 hours: Filed Vitals:   03/14/12 0500 03/14/12 0501 03/14/12 0502 03/14/12 0900  BP: 148/74 156/76 151/83 167/81  Pulse: 74 62 67 58  Temp: 97.7 F (36.5 C)   97.6 F (36.4 C)  TempSrc: Oral   Oral  Resp: 16   16  Height:      Weight:      SpO2: 95%   97%    Weight change:   Body mass index is 19.53 kg/(m^2).  Intake/Output from previous day:  Intake/Output Summary (Last 24 hours) at 03/14/12 1236 Last data filed at 03/14/12 0900  Gross per 24 hour  Intake    123 ml  Output      0 ml  Net    123 ml    PHYSICAL EXAM: Gen Exam: Awake and alert with clear speech.   Neck: Supple, No JVD.   Chest: B/L Clear.   CVS: S1 S2 Regular, no murmurs.  Abdomen: soft, BS +, non tender, non distended.  Extremities: no edema, lower extremities warm to touch. Neurologic: Non Focal.   Skin: No Rash.   Wounds: N/A.    CONSULTS:  None  LAB RESULTS: CBC  Lab 03/14/12 0510 03/14/12 0221 03/13/12 2226 03/13/12 2208  WBC 6.9 7.3 -- 9.0  HGB 11.4* 11.5* 10.9* 11.3*  HCT 33.1* 34.3* 32.0* 33.2*  PLT 160 149* -- 153  MCV 90.7 90.0 -- 90.0  MCH 31.2 30.2 -- 30.6  MCHC 34.4 33.5 -- 34.0  RDW 12.9 12.9 -- 13.0  LYMPHSABS -- -- -- 1.7  MONOABS -- -- -- 0.9  EOSABS -- -- -- 0.2  BASOSABS -- -- -- 0.0  BANDABS -- -- -- --    Chemistries   Lab 03/14/12 0510 03/14/12 0221 03/13/12 2226  NA 139 -- 140  K 3.5 -- 3.5  CL 102 -- 102  CO2 27 -- --  GLUCOSE 96 -- 117*  BUN 19 -- 24*  CREATININE 0.73 0.85 1.20*  CALCIUM 8.4 -- --  MG -- -- --    GFR Estimated Creatinine Clearance: 52.5 ml/min (by C-G formula based on Cr of 0.73).  Coagulation profile No results found for this basename: INR:5,PROTIME:5 in the last 168 hours  Cardiac  Enzymes No results found for this basename: CK:3,CKMB:3,TROPONINI:3,MYOGLOBIN:3 in the last 168 hours  No components found with this basename: POCBNP:3 No results found for this basename: DDIMER:2 in the last 72 hours No results found for this basename: HGBA1C:2 in the last 72 hours No results found for this basename: CHOL:2,HDL:2,LDLCALC:2,TRIG:2,CHOLHDL:2,LDLDIRECT:2 in the last 72 hours No results found for this basename: TSH,T4TOTAL,FREET3,T3FREE,THYROIDAB in the last 72 hours No results found for this basename: VITAMINB12:2,FOLATE:2,FERRITIN:2,TIBC:2,IRON:2,RETICCTPCT:2 in the last 72 hours No results found for this basename: LIPASE:2,AMYLASE:2 in the last 72 hours  Urine Studies No results found for this basename: UACOL:2,UAPR:2,USPG:2,UPH:2,UTP:2,UGL:2,UKET:2,UBIL:2,UHGB:2,UNIT:2,UROB:2,ULEU:2,UEPI:2,UWBC:2,URBC:2,UBAC:2,CAST:2,CRYS:2,UCOM:2,BILUA:2 in the last 72 hours  MICROBIOLOGY: No results found for this or any previous visit (from the past 240 hour(s)).  RADIOLOGY STUDIES/RESULTS: Ct Head Wo Contrast  03/13/2012  *RADIOLOGY REPORT*  Clinical Data: Dizziness.  Generalized weakness.  Nausea and vomiting.  CT HEAD WITHOUT CONTRAST  Technique:  Contiguous axial images were obtained from the base of the skull through the vertex without contrast.  Comparison: MRI brain 07/11/2010.  Findings: Mild bifrontal cortical  atrophy, unchanged.  Mild changes of small vessel disease of the white matter.  Ventricular system normal in size and appearance for age.   No mass lesion.  No midline shift.  No acute hemorrhage or hematoma.  No extra-axial fluid collections.  No evidence of acute infarction.  No skull fracture or other focal osseous abnormality involving the skull.  Visualized paranasal sinuses, mastoid air cells, and middle ear cavities well-aerated.  Mild bilateral carotid siphon atherosclerosis.  IMPRESSION:  1.  No acute intracranial abnormality. 2.  Mild bifrontal cortical atrophy and  mild chronic microvascular ischemic changes of the white matter.  Original Report Authenticated By: Arnell Sieving, M.D.    MEDICATIONS: Scheduled Meds:   . amLODipine  5 mg Oral Daily  . diazepam  5 mg Intravenous Once  . enoxaparin  30 mg Subcutaneous Daily  . irbesartan  150 mg Oral Daily  . ondansetron      . potassium chloride  10 mEq Oral Daily  . sodium chloride  500 mL Intravenous Once  . sodium chloride  3 mL Intravenous Q12H   Continuous Infusions:   . 0.9 % NaCl with KCl 20 mEq / L 100 mL/hr at 03/14/12 0407   PRN Meds:.acetaminophen, acetaminophen, alum & mag hydroxide-simeth, HYDROcodone-acetaminophen, meclizine, ondansetron (ZOFRAN) IV, ondansetron  Antibiotics: Anti-infectives    None      Assessment/Plan: Patient Active Hospital Problem List: Vertigo -better -apparently had a sore throat-last week -likely peripheral vertigo-per patient vertigo worse on leaning forward, so wither BPPV or Labrynthitis (had sore throat last week) -getting Vestibular PT -await MRI Brain -if all work up negative-and continues to do well-will D/C home  ARF -resolved -was likely pre-renal from nausea and vomiting  HTN -Continue with amlodipine and avapro -resume HCTZ on discharge  Dyslipidemia -resume statin on discharge  Bicuspid aortic valve  -outpatient follow up with primary cardiologist  Disposition: Await work up-d/c home later today or in am  Code Status: Full Code  Werner Lean Alrick Cubbage,MD. 03/14/2012, 12:36 PM

## 2012-03-14 NOTE — ED Provider Notes (Signed)
History     CSN: [id number]  Arrival date & time 03/13/12  2133   First MD Initiated Contact with Patient 03/13/12 2204      Chief Complaint  Patient presents with  . Emesis  . Dizziness    (Consider location/radiation/quality/duration/timing/severity/associated sxs/prior treatment) HPI Comments: Patient stated that last week.  She was out of town and forgot her blood pressure medicine.  She was recently restarted taking that she was seen by her primary care physician 2 days ago for vague symptoms of dizziness that this progressed through the past 2, days, today being the worst.  She was unable to get out of the bed, but she did prepare dinner for herself and her husband shortly after that the dizziness became worse.  She called EMS.  They administered IV Zofran she describes the dizziness as being worse when she moves her head.  Changes positions.  Feels, like she needs to hold onto an object to steady herself .  She denies recent URI symptoms, tick exposure, fevers, sore throats, ear infection  Patient is a 71 y.o. female presenting with vomiting. The history is provided by the patient.  Emesis  This is a new problem. The problem has not changed since onset.There has been no fever. Pertinent negatives include no chills, no diarrhea, no fever and no headaches.    Past Medical History  Diagnosis Date  . Hypertension   . Hyperlipidemia   . Aortic insufficiency   . Vertigo     Past Surgical History  Procedure Date  . Tubal ligation   . Benign tumor removal     from roof of mouth    Family History  Problem Relation Age of Onset  . Arrhythmia Mother   . Bone cancer Father     History  Substance Use Topics  . Smoking status: Never Smoker   . Smokeless tobacco: Not on file  . Alcohol Use: No    OB History    Grav Para Term Preterm Abortions TAB SAB Ect Mult Living                  Review of Systems  Constitutional: Negative for fever and chills.  HENT: Negative  for ear pain, congestion, sore throat, rhinorrhea, trouble swallowing, neck pain, neck stiffness and ear discharge.   Gastrointestinal: Positive for nausea and vomiting. Negative for diarrhea.  Neurological: Positive for dizziness. Negative for weakness and headaches.    Allergies  Ibuprofen  Home Medications   Current Outpatient Rx  Name Route Sig Dispense Refill  . ATORVASTATIN CALCIUM 20 MG PO TABS Oral Take 20 mg by mouth daily.    Marland Kitchen BIOTIN 5000 PO Oral Take 5,000 mcg by mouth daily.    Marland Kitchen VITAMIN D-3 PO Oral Take 2,000 Units by mouth daily.    Marland Kitchen HYDROCHLOROTHIAZIDE 25 MG PO TABS Oral Take 25 mg by mouth daily.    . ADULT MULTIVITAMIN W/MINERALS CH Oral Take 1 tablet by mouth daily.    Marland Kitchen OLMESARTAN MEDOXOMIL 40 MG PO TABS Oral Take 40 mg by mouth daily.    Marland Kitchen POTASSIUM CHLORIDE ER 10 MEQ PO TBCR Oral Take 10 mEq by mouth daily.    Marland Kitchen ALIGN PO Oral Take by mouth. Once a Day for 3 weeks    . RISEDRONATE SODIUM 35 MG PO TBEC Oral Take 1 tablet by mouth every 7 (seven) days. Takes on Monday      BP 144/66  Pulse 69  Temp 97.5 F (36.4  C)  Resp 16  SpO2 96%  Physical Exam  Constitutional: She is oriented to person, place, and time. She appears well-developed and well-nourished.  HENT:  Head: Normocephalic.  Eyes: Pupils are equal, round, and reactive to light.  Neck: Normal range of motion.  Cardiovascular: Normal rate.   Pulmonary/Chest: Effort normal.  Musculoskeletal: Normal range of motion.  Neurological: She is alert and oriented to person, place, and time.  Skin: There is pallor.    ED Course  Procedures (including critical care time)  Labs Reviewed  CBC - Abnormal; Notable for the following:    RBC 3.69 (*)    Hemoglobin 11.3 (*)    HCT 33.2 (*)    All other components within normal limits  POCT I-STAT, CHEM 8 - Abnormal; Notable for the following:    BUN 24 (*)    Creatinine, Ser 1.20 (*)    Glucose, Bld 117 (*)    Hemoglobin 10.9 (*)    HCT 32.0 (*)     All other components within normal limits  DIFFERENTIAL  URINALYSIS, ROUTINE W REFLEX MICROSCOPIC   Ct Head Wo Contrast  03/13/2012  *RADIOLOGY REPORT*  Clinical Data: Dizziness.  Generalized weakness.  Nausea and vomiting.  CT HEAD WITHOUT CONTRAST  Technique:  Contiguous axial images were obtained from the base of the skull through the vertex without contrast.  Comparison: MRI brain 07/11/2010.  Findings: Mild bifrontal cortical atrophy, unchanged.  Mild changes of small vessel disease of the white matter.  Ventricular system normal in size and appearance for age.   No mass lesion.  No midline shift.  No acute hemorrhage or hematoma.  No extra-axial fluid collections.  No evidence of acute infarction.  No skull fracture or other focal osseous abnormality involving the skull.  Visualized paranasal sinuses, mastoid air cells, and middle ear cavities well-aerated.  Mild bilateral carotid siphon atherosclerosis.  IMPRESSION:  1.  No acute intracranial abnormality. 2.  Mild bifrontal cortical atrophy and mild chronic microvascular ischemic changes of the white matter.  Original Report Authenticated By: Arnell Sieving, M.D.     1. Dizzy   2. Hypertension       MDM  We'll treat for vertigo, but I'm concerned about hypertension.  Will CT head to rule out stroke, although it is very nonfocal.  Activity ambulate patient.  She was quite ataxic.  Assisted back to bed.  Will admit to the hospital for MRI in the morning to rule out cerebellar stroke        Arman Filter, NP 03/14/12 0107  Arman Filter, NP 03/14/12 4540  Arman Filter, NP 03/14/12 [phone]

## 2012-03-14 NOTE — H&P (Addendum)
PCP:   Ezequiel Kayser, MD, MD   Chief Complaint:  Dizziness  HPI: This is a pleasant 71 year old female who states on Tuesday she not feel very well. She checked her blood pressureand it was 190/115. she reports no localized weakness. She does report some nausea and  vomiting, No hematemesis. She developed left-sided headache and she's been dizzy. Today her dizziness returne. She says it is mostly on standing. She has not had any syncopal events. today she again had nausea and vomiting and left-sided headache. She does have a history of chronic tinnitus bilaterally. As well as bilateral hearing loss, right greater than left. These have been ongoing since childhood. Her only new medication is hydrochlorothiazide. Approximately 2 weeks ago she went to the mountains with her husband, however, she states she did not go hiking, no tick bites. She reports no rash. She was recently diagnosed with colitis, she had a colonoscopy, she had EGD at that point. She was having chronic diarrhea, however, she states it is better today. There's no report of altered mentation or localized weaknesses. History provided by the patient was alert and oriented, her husband at bedside.  Review of Systems: Positives bolded    anorexia, fever, weight loss,, vision loss, decreased hearing, hoarseness, chest pain, syncope, dyspnea on exertion, peripheral edema, balance deficits, hemoptysis, abdominal pain, melena, hematochezia, severe indigestion/heartburn, hematuria, incontinence, genital sores, muscle weakness, suspicious skin lesions, transient blindness, difficulty walking, depression, unusual weight change, abnormal bleeding, enlarged lymph nodes, angioedema, and breast masses.  Past Medical History: Past Medical History  Diagnosis Date  . Hypertension   . Hyperlipidemia   . Aortic insufficiency   . Vertigo   . Migraine   . Bicuspid aortic valve    Past Surgical History  Procedure Date  . Tubal ligation   . Benign  tumor removal     from roof of mouth    Medications: Prior to Admission medications   Medication Sig Start Date End Date Taking? Authorizing Provider  atorvastatin (LIPITOR) 20 MG tablet Take 20 mg by mouth daily.   Yes Historical Provider, MD  BIOTIN 5000 PO Take 5,000 mcg by mouth daily.   Yes Historical Provider, MD  Cholecalciferol (VITAMIN D-3 PO) Take 2,000 Units by mouth daily.   Yes Historical Provider, MD  hydrochlorothiazide (HYDRODIURIL) 25 MG tablet Take 25 mg by mouth daily.   Yes Historical Provider, MD  Multiple Vitamin (MULITIVITAMIN WITH MINERALS) TABS Take 1 tablet by mouth daily.   Yes Historical Provider, MD  olmesartan (BENICAR) 40 MG tablet Take 40 mg by mouth daily.   Yes Historical Provider, MD  potassium chloride (K-DUR) 10 MEQ tablet Take 10 mEq by mouth daily.   Yes Historical Provider, MD  Probiotic Product (ALIGN PO) Take by mouth. Once a Day for 3 weeks   Yes Historical Provider, MD  Risedronate Sodium (ATELVIA) 35 MG TBEC Take 1 tablet by mouth every 7 (seven) days. Takes on Monday   Yes Historical Provider, MD    Allergies:   Allergies  Allergen Reactions  . Ibuprofen Nausea And Vomiting    vertigo    Social History:  reports that she has never smoked. She does not have any smokeless tobacco history on file. She reports that she does not drink alcohol or use illicit drugs.  Family History: Family History  Problem Relation Age of Onset  . Arrhythmia Mother   . Bone cancer Father     Physical Exam: Filed Vitals:   03/13/12 2344 03/14/12 0127  03/14/12 0128 03/14/12 0130  BP: 144/66 157/69 170/80 155/92  Pulse: 69 66 65 73  Temp:      Resp: 16     SpO2: 96%       General:  Alert and oriented times three, well developed and nourished, no acute distress Eyes: PERRLA, pink conjunctiva, no scleral icterus ENT: Moist oral mucosa, neck supple, no thyromegaly Lungs: clear to ascultation, no wheeze, no crackles, no use of accessory  muscles Cardiovascular: regular rate and rhythm, no regurgitation, no gallops, no murmurs. No carotid bruits, no JVD Abdomen: soft, positive BS, non-tender, non-distended, no organomegaly, not an acute abdomen GU: not examined Neuro: CN II - XII grossly intact, sensation intact Musculoskeletal: strength 5/5 all extremities, no clubbing, cyanosis or edema Skin: no rash, no subcutaneous crepitation, no decubitus Psych: appropriate patient   Labs on Admission:   Basename 03/13/12 2226  NA 140  K 3.5  CL 102  CO2 --  GLUCOSE 117*  BUN 24*  CREATININE 1.20*  CALCIUM --  MG --  PHOS --   No results found for this basename: AST:2,ALT:2,ALKPHOS:2,BILITOT:2,PROT:2,ALBUMIN:2 in the last 72 hours No results found for this basename: LIPASE:2,AMYLASE:2 in the last 72 hours  Basename 03/13/12 2226 03/13/12 2208  WBC -- 9.0  NEUTROABS -- 6.3  HGB 10.9* 11.3*  HCT 32.0* 33.2*  MCV -- 90.0  PLT -- 153   No results found for this basename: CKTOTAL:3,CKMB:3,CKMBINDEX:3,TROPONINI:3 in the last 72 hours No components found with this basename: POCBNP:3 No results found for this basename: DDIMER:2 in the last 72 hours No results found for this basename: HGBA1C:2 in the last 72 hours No results found for this basename: CHOL:2,HDL:2,LDLCALC:2,TRIG:2,CHOLHDL:2,LDLDIRECT:2 in the last 72 hours No results found for this basename: TSH,T4TOTAL,FREET3,T3FREE,THYROIDAB in the last 72 hours No results found for this basename: VITAMINB12:2,FOLATE:2,FERRITIN:2,TIBC:2,IRON:2,RETICCTPCT:2 in the last 72 hours  Micro Results: No results found for this or any previous visit (from the past 240 hour(s)). Results for EMANUELLE, HAMMERSTROM (MRN [id number]) as of 03/14/2012 01:35  Ref. Range 03/14/2012 01:07  Color, Urine Latest Range: YELLOW  YELLOW  APPearance Latest Range: CLEAR  HAZY (A)  Specific Gravity, Urine Latest Range: 1.005-1.030  1.014  pH Latest Range: 5.0-8.0  7.5  Glucose, UA Latest Range: NEGATIVE  mg/dL NEGATIVE  Bilirubin Urine Latest Range: NEGATIVE  NEGATIVE  Ketones, ur Latest Range: NEGATIVE mg/dL NEGATIVE  Protein Latest Range: NEGATIVE mg/dL NEGATIVE  Urobilinogen, UA Latest Range: 0.0-1.0 mg/dL 0.2  Nitrite Latest Range: NEGATIVE  NEGATIVE  Leukocytes, UA Latest Range: NEGATIVE  NEGATIVE  Hgb urine dipstick Latest Range: NEGATIVE  NEGATIVE    Radiological Exams on Admission: Ct Head Wo Contrast  03/13/2012  *RADIOLOGY REPORT*  Clinical Data: Dizziness.  Generalized weakness.  Nausea and vomiting.  CT HEAD WITHOUT CONTRAST  Technique:  Contiguous axial images were obtained from the base of the skull through the vertex without contrast.  Comparison: MRI brain 07/11/2010.  Findings: Mild bifrontal cortical atrophy, unchanged.  Mild changes of small vessel disease of the white matter.  Ventricular system normal in size and appearance for age.   No mass lesion.  No midline shift.  No acute hemorrhage or hematoma.  No extra-axial fluid collections.  No evidence of acute infarction.  No skull fracture or other focal osseous abnormality involving the skull.  Visualized paranasal sinuses, mastoid air cells, and middle ear cavities well-aerated.  Mild bilateral carotid siphon atherosclerosis.  IMPRESSION:  1.  No acute intracranial abnormality. 2.  Mild bifrontal  cortical atrophy and mild chronic microvascular ischemic changes of the white matter.  Original Report Authenticated By: Arnell Sieving, M.D.    Assessment/Plan Present on Admission:  .Dizziness Admit to telemetry Differential diagnosis includes CVA, vertigo, atypical migraine, orthostatic hypotension Will order MRI for the a.m., orthostatic vitals repeated in the a.m., IV fluid hydration, meclizine, aspirin Antiemetics will be ordered as needed EKG ordered .Hypertension uncontrolled Add low-dose Norvasc by mouth. No beta blockers as patient is almost bradycardic Likely acute kidney injury IV fluid hydration ordered, HCTZ  will be held  .Hyperlipidemia Bicuspid aortic valve History of migraines Resume home medications  Full code DVT prophylaxis Team 7/Dr. Nadara Mustard, Natacha Jepsen 03/14/2012, 1:35 AM

## 2012-03-15 DIAGNOSIS — E782 Mixed hyperlipidemia: Secondary | ICD-10-CM

## 2012-03-15 DIAGNOSIS — R42 Dizziness and giddiness: Secondary | ICD-10-CM

## 2012-03-15 DIAGNOSIS — I10 Essential (primary) hypertension: Secondary | ICD-10-CM

## 2012-03-15 MED ORDER — MECLIZINE HCL 25 MG PO TABS: 25.0000 mg | ORAL_TABLET | Freq: Three times a day (TID) | ORAL | Status: AC | PRN

## 2012-03-15 NOTE — ED Provider Notes (Signed)
Medical screening examination/treatment/procedure(s) were performed by non-physician practitioner and as supervising physician I was immediately available for consultation/collaboration.   Jaime Grizzell, MD 03/15/12 0408 

## 2012-03-15 NOTE — Discharge Summary (Signed)
PATIENT DETAILS Name: Bailey [name] Age: 71 y.o. Sex: female Date of Birth: Mar 14, 1941 MRN: [id number]. Admit Date: 03/13/2012 Admitting Physician: Gery Pray, MD WUJ:WJXBJY,NWGN A, MD, MD  PRIMARY DISCHARGE DIAGNOSIS:  Principal Problem:  *Dizziness Active Problems:  Bicuspid aortic valve  Hypertension  Hyperlipidemia  Migraine      PAST MEDICAL HISTORY: Past Medical History  Diagnosis Date  . Hypertension   . Hyperlipidemia   . Aortic insufficiency   . Vertigo   . Migraine   . Bicuspid aortic valve     DISCHARGE MEDICATIONS: Medication List  As of 03/15/2012  9:52 AM   TAKE these medications         ALIGN PO   Take by mouth. Once a Day for 3 weeks      ATELVIA 35 MG Tbec   Generic drug: Risedronate Sodium   Take 1 tablet by mouth every 7 (seven) days. Takes on Monday      atorvastatin 20 MG tablet   Commonly known as: LIPITOR   Take 20 mg by mouth daily.      BIOTIN 5000 PO   Take 5,000 mcg by mouth daily.      hydrochlorothiazide 25 MG tablet   Commonly known as: HYDRODIURIL   Take 25 mg by mouth daily.      meclizine 25 MG tablet   Commonly known as: ANTIVERT   Take 1 tablet (25 mg total) by mouth every 8 (eight) hours as needed for dizziness.      mulitivitamin with minerals Tabs   Take 1 tablet by mouth daily.      olmesartan 40 MG tablet   Commonly known as: BENICAR   Take 40 mg by mouth daily.      potassium chloride 10 MEQ tablet   Commonly known as: K-DUR   Take 10 mEq by mouth daily.      VITAMIN D-3 PO   Take 2,000 Units by mouth daily.             BRIEF HPI:  See H&P, Labs, Consult and Test reports for all details in brief, patient was admitted for vertigo.  CONSULTATIONS:   None  PERTINENT RADIOLOGIC STUDIES: Ct Head Wo Contrast  03/13/2012  *RADIOLOGY REPORT*  Clinical Data: Dizziness.  Generalized weakness.  Nausea and vomiting.  CT HEAD WITHOUT CONTRAST  Technique:  Contiguous axial images were obtained from the  base of the skull through the vertex without contrast.  Comparison: MRI brain 07/11/2010.  Findings: Mild bifrontal cortical atrophy, unchanged.  Mild changes of small vessel disease of the white matter.  Ventricular system normal in size and appearance for age.   No mass lesion.  No midline shift.  No acute hemorrhage or hematoma.  No extra-axial fluid collections.  No evidence of acute infarction.  No skull fracture or other focal osseous abnormality involving the skull.  Visualized paranasal sinuses, mastoid air cells, and middle ear cavities well-aerated.  Mild bilateral carotid siphon atherosclerosis.  IMPRESSION:  1.  No acute intracranial abnormality. 2.  Mild bifrontal cortical atrophy and mild chronic microvascular ischemic changes of the white matter.  Original Report Authenticated By: Arnell Sieving, M.D.   Mr Laqueta Jean Wo Contrast  03/15/2012  *RADIOLOGY REPORT*  Clinical Data: Malaise, headache, vertigo and nausea.  MRI HEAD WITHOUT AND WITH CONTRAST  Technique:  Multiplanar, multiecho pulse sequences of the brain and surrounding structures were obtained according to standard protocol without and with intravenous contrast  Contrast: 12mL MULTIHANCE GADOBENATE  DIMEGLUMINE 529 MG/ML IV SOLN  Comparison: 03/13/2012 CT.  07/11/2010 and 01/10/2010 MR.  Findings: No acute infarct.  No intracranial hemorrhage.  Scattered nonspecific white matter type changes most consistent with result of small vessel disease with minimal change from prior exam.  No intracranial mass or abnormal enhancement.  Mild anterior slip of C4 with relative retro listhesis C5 with mild impression upon the ventral aspect of the cervical spine.  This is noted on the prior exam.  Major intracranial vascular structures are patent.  IMPRESSION: No acute infarct.  Please see above.  Original Report Authenticated By: Fuller Canada, M.D.     PERTINENT LAB RESULTS: CBC:  Basename 03/14/12 0510 03/14/12 0221  WBC 6.9 7.3  HGB  11.4* 11.5*  HCT 33.1* 34.3*  PLT 160 149*   CMET CMP     Component Value Date/Time   NA 139 03/14/2012 0510   K 3.5 03/14/2012 0510   CL 102 03/14/2012 0510   CO2 27 03/14/2012 0510   GLUCOSE 96 03/14/2012 0510   BUN 19 03/14/2012 0510   CREATININE 0.73 03/14/2012 0510   CALCIUM 8.4 03/14/2012 0510   GFRNONAA 85* 03/14/2012 0510   GFRAA >90 03/14/2012 0510    GFR Estimated Creatinine Clearance: 52.5 ml/min (by C-G formula based on Cr of 0.73). No results found for this basename: LIPASE:2,AMYLASE:2 in the last 72 hours No results found for this basename: CKTOTAL:3,CKMB:3,CKMBINDEX:3,TROPONINI:3 in the last 72 hours No components found with this basename: POCBNP:3 No results found for this basename: DDIMER:2 in the last 72 hours No results found for this basename: HGBA1C:2 in the last 72 hours No results found for this basename: CHOL:2,HDL:2,LDLCALC:2,TRIG:2,CHOLHDL:2,LDLDIRECT:2 in the last 72 hours No results found for this basename: TSH,T4TOTAL,FREET3,T3FREE,THYROIDAB in the last 72 hours No results found for this basename: VITAMINB12:2,FOLATE:2,FERRITIN:2,TIBC:2,IRON:2,RETICCTPCT:2 in the last 72 hours Coags: No results found for this basename: PT:2,INR:2 in the last 72 hours Microbiology: No results found for this or any previous visit (from the past 240 hour(s)).   BRIEF HOSPITAL COURSE:   Principal Problem: Vertigo  -resolved -apparently had a sore throat-last week  -likely peripheral vertigo-per patient vertigo worse on leaning forward, so wither BPPV or Labrynthitis (had sore throat last week)  -seen by Vestibular PT-no further recommendations  - MRI Brain -negative for acute CVA -since all symptoms resolved, patient ambulating and MRI brain negative, no further work-up is needed, patient stable for discharge hom  ARF  -resolved  -was likely pre-renal from nausea and vomiting   HTN  -resume her prior anti-hypertensive at discharge  Dyslipidemia  -resume statin on  discharge   Bicuspid aortic valve  -outpatient follow up with primary cardiologist    TODAY-DAY OF DISCHARGE:  Subjective:   Bailey [name] today has no headache,no chest abdominal pain,no new weakness tingling or numbness, feels much better wants to go home today. She has no further vertigo and is ambulating in the hallway.  Objective:   Blood pressure 144/76, pulse 58, temperature 97.8 F (36.6 C), temperature source Oral, resp. rate 14, height 5' 3.5" (1.613 m), weight 50.8 kg (111 lb 15.9 oz), SpO2 94.00%.  Intake/Output Summary (Last 24 hours) at 03/15/12 0952 Last data filed at 03/14/12 1700  Gross per 24 hour  Intake    240 ml  Output      2 ml  Net    238 ml    Exam Awake Alert, Oriented *3, No new F.N deficits, Normal affect [place].AT,PERRAL Supple Neck,No JVD, No cervical lymphadenopathy  appriciated.  Symmetrical Chest wall movement, Good air movement bilaterally, CTAB RRR,No Gallops,Rubs or new Murmurs, No Parasternal Heave +ve B.Sounds, Abd Soft, Non tender, No organomegaly appriciated, No rebound -guarding or rigidity. No Cyanosis, Clubbing or edema, No new Rash or bruise  DISPOSITION: Home   DISCHARGE INSTRUCTIONS:    Follow-up Information    Follow up with PERINI,MARK A, MD. Schedule an appointment as soon as possible for a visit in 1 week.   Contact information:   [id number] [facility] [place] [address] [phone]           Total Time spent on discharge equals 45 minutes.  SignedJeoffrey Massed 03/15/2012 9:52 AM

## 2012-03-15 NOTE — Progress Notes (Signed)
Bailey [name] to be D/C'd Home per MD order.  Discussed with the patient and all questions fully answered.   Jeany, [place] I  Home Medication Instructions JYN:[id number]   Printed on:03/15/12 0954  Medication Information                    olmesartan (BENICAR) 40 MG tablet Take 40 mg by mouth daily.           Cholecalciferol (VITAMIN D-3 PO) Take 2,000 Units by mouth daily.           BIOTIN 5000 PO Take 5,000 mcg by mouth daily.           Probiotic Product (ALIGN PO) Take by mouth. Once a Day for 3 weeks           Multiple Vitamin (MULITIVITAMIN WITH MINERALS) TABS Take 1 tablet by mouth daily.           atorvastatin (LIPITOR) 20 MG tablet Take 20 mg by mouth daily.           potassium chloride (K-DUR) 10 MEQ tablet Take 10 mEq by mouth daily.           hydrochlorothiazide (HYDRODIURIL) 25 MG tablet Take 25 mg by mouth daily.           Risedronate Sodium (ATELVIA) 35 MG TBEC Take 1 tablet by mouth every 7 (seven) days. Takes on Monday           meclizine (ANTIVERT) 25 MG tablet Take 1 tablet (25 mg total) by mouth every 8 (eight) hours as needed for dizziness.             VVS, Skin clean, dry and intact without evidence of skin break down, no evidence of skin tears noted. IV catheter discontinued intact. Site without signs and symptoms of complications. Dressing and pressure applied.  An After Visit Summary was printed and given to the patient. Patient escorted via WC, and D/C home via private auto.  Kennyth Arnold D 03/15/2012 9:54 AM

## 2012-03-28 ENCOUNTER — Other Ambulatory Visit (INDEPENDENT_AMBULATORY_CARE_PROVIDER_SITE_OTHER): Payer: Medicare Other

## 2012-03-28 DIAGNOSIS — Q231 Congenital insufficiency of aortic valve: Secondary | ICD-10-CM

## 2012-03-28 DIAGNOSIS — E785 Hyperlipidemia, unspecified: Secondary | ICD-10-CM

## 2012-03-28 DIAGNOSIS — I10 Essential (primary) hypertension: Secondary | ICD-10-CM

## 2012-03-28 LAB — BASIC METABOLIC PANEL
BUN: 18 mg/dL (ref 6–23)
CO2: 28 mEq/L (ref 19–32)
Chloride: 99 mEq/L (ref 96–112)
Creatinine, Ser: 0.7 mg/dL (ref 0.4–1.2)
Glucose, Bld: 82 mg/dL (ref 70–99)

## 2012-04-22 ENCOUNTER — Other Ambulatory Visit: Payer: Self-pay | Admitting: Obstetrics and Gynecology

## 2012-04-22 DIAGNOSIS — Z1231 Encounter for screening mammogram for malignant neoplasm of breast: Secondary | ICD-10-CM

## 2012-05-06 ENCOUNTER — Ambulatory Visit
Admission: RE | Admit: 2012-05-06 | Discharge: 2012-05-06 | Disposition: A | Discharge status: Home / Self Care | Payer: Medicare Other | Source: Ambulatory Visit | Attending: Obstetrics and Gynecology | Admitting: Obstetrics and Gynecology

## 2012-05-06 DIAGNOSIS — Z1231 Encounter for screening mammogram for malignant neoplasm of breast: Secondary | ICD-10-CM

## 2013-01-01 ENCOUNTER — Encounter (HOSPITAL_COMMUNITY): Payer: Self-pay | Admitting: *Deleted

## 2013-01-01 ENCOUNTER — Emergency Department (INDEPENDENT_AMBULATORY_CARE_PROVIDER_SITE_OTHER)
Admission: EM | Admit: 2013-01-01 | Discharge: 2013-01-01 | Disposition: A | Discharge status: Other Acute Inpatient Hospital | Payer: Medicare Other | Source: Home / Self Care

## 2013-01-01 ENCOUNTER — Emergency Department (HOSPITAL_COMMUNITY)
Admission: EM | Admit: 2013-01-01 | Discharge: 2013-01-02 | Disposition: A | Discharge status: Home / Self Care | Payer: Medicare Other | Attending: Emergency Medicine | Admitting: Emergency Medicine

## 2013-01-01 DIAGNOSIS — K529 Noninfective gastroenteritis and colitis, unspecified: Secondary | ICD-10-CM

## 2013-01-01 DIAGNOSIS — R1110 Vomiting, unspecified: Secondary | ICD-10-CM | POA: Insufficient documentation

## 2013-01-01 DIAGNOSIS — Z79899 Other long term (current) drug therapy: Secondary | ICD-10-CM | POA: Insufficient documentation

## 2013-01-01 DIAGNOSIS — E785 Hyperlipidemia, unspecified: Secondary | ICD-10-CM | POA: Insufficient documentation

## 2013-01-01 DIAGNOSIS — E878 Other disorders of electrolyte and fluid balance, not elsewhere classified: Secondary | ICD-10-CM

## 2013-01-01 DIAGNOSIS — R197 Diarrhea, unspecified: Secondary | ICD-10-CM

## 2013-01-01 DIAGNOSIS — K5289 Other specified noninfective gastroenteritis and colitis: Secondary | ICD-10-CM

## 2013-01-01 DIAGNOSIS — I10 Essential (primary) hypertension: Secondary | ICD-10-CM | POA: Insufficient documentation

## 2013-01-01 DIAGNOSIS — E871 Hypo-osmolality and hyponatremia: Secondary | ICD-10-CM | POA: Insufficient documentation

## 2013-01-01 DIAGNOSIS — Z8679 Personal history of other diseases of the circulatory system: Secondary | ICD-10-CM | POA: Insufficient documentation

## 2013-01-01 LAB — POCT I-STAT, CHEM 8
Calcium, Ion: 1.12 mmol/L — ABNORMAL LOW (ref 1.13–1.30)
Creatinine, Ser: 1 mg/dL (ref 0.50–1.10)
Glucose, Bld: 103 mg/dL — ABNORMAL HIGH (ref 70–99)
HCT: 37 % (ref 36.0–46.0)
Hemoglobin: 12.6 g/dL (ref 12.0–15.0)

## 2013-01-01 LAB — CBC WITH DIFFERENTIAL/PLATELET
Basophils Absolute: 0 10*3/uL (ref 0.0–0.1)
Basophils Relative: 0 % (ref 0–1)
Eosinophils Absolute: 0.1 10*3/uL (ref 0.0–0.7)
Eosinophils Relative: 1 % (ref 0–5)
HCT: 35.7 % — ABNORMAL LOW (ref 36.0–46.0)
Lymphs Abs: 1.4 10*3/uL (ref 0.7–4.0)
MCH: 30.6 pg (ref 26.0–34.0)
MCHC: 36.4 g/dL — ABNORMAL HIGH (ref 30.0–36.0)
Monocytes Absolute: 0.6 10*3/uL (ref 0.1–1.0)
Neutro Abs: 6.8 10*3/uL (ref 1.7–7.7)
Neutrophils Relative %: 77 % (ref 43–77)
RDW: 12.6 % (ref 11.5–15.5)

## 2013-01-01 LAB — COMPREHENSIVE METABOLIC PANEL
AST: 33 U/L (ref 0–37)
Albumin: 3.6 g/dL (ref 3.5–5.2)
BUN: 9 mg/dL (ref 6–23)
CO2: 23 mEq/L (ref 19–32)
Chloride: 91 mEq/L — ABNORMAL LOW (ref 96–112)
Creatinine, Ser: 0.8 mg/dL (ref 0.50–1.10)
GFR calc non Af Amer: 72 mL/min — ABNORMAL LOW (ref >90)
Total Bilirubin: 0.6 mg/dL (ref 0.3–1.2)
Total Protein: 6.7 g/dL (ref 6.0–8.3)

## 2013-01-01 LAB — POCT URINALYSIS DIP (DEVICE)
Glucose, UA: NEGATIVE mg/dL
Nitrite: NEGATIVE
Urobilinogen, UA: 0.2 mg/dL (ref 0.0–1.0)
pH: 5.5 (ref 5.0–8.0)

## 2013-01-01 LAB — LIPASE, BLOOD: Lipase: 35 U/L (ref 11–59)

## 2013-01-01 MED ORDER — SODIUM CHLORIDE 0.9 % IV BOLUS (SEPSIS)
1000.0000 mL | Freq: Once | INTRAVENOUS | Status: AC
  Administered 2013-01-01: 1000 mL via INTRAVENOUS

## 2013-01-01 MED ORDER — PROMETHAZINE HCL 12.5 MG PO TABS
12.5000 mg | ORAL_TABLET | Freq: Once | ORAL | Status: AC
  Administered 2013-01-01: 12.5 mg via ORAL
  Filled 2013-01-01: qty 1

## 2013-01-01 NOTE — ED Provider Notes (Signed)
History     CSN: [id number]  Arrival date & time 01/01/13  Bailey [name]   First MD Initiated Contact with Patient 01/01/13 2319      Chief Complaint  Patient presents with  . Diarrhea  . Emesis    (Consider location/radiation/quality/duration/timing/severity/associated sxs/prior treatment) HPI Hx per PT - diarrhea x 2 weeks with waves of nausea, no vomiting. Called PCP and took flagyl and zofran for 2 days but she thought that was making her more sick so she stopped it. No recorded fevers, some chills and now generalized weakness, has h/o colitis but this is different. No travel , no blood in stool. Mod in severity, worse last few days, referred to UC and had labs and sent here for low sodium. Is scheduled to see DR Buccini [facility] in the morning.    Past Medical History  Diagnosis Date  . Hypertension   . Hyperlipidemia   . Aortic insufficiency   . Vertigo   . Migraine   . Bicuspid aortic valve     Past Surgical History  Procedure Laterality Date  . Tubal ligation    . Benign tumor removal      from roof of mouth    Family History  Problem Relation Age of Onset  . Arrhythmia Mother   . Bone cancer Father     History  Substance Use Topics  . Smoking status: Never Smoker   . Smokeless tobacco: Not on file  . Alcohol Use: No    OB History   Grav Para Term Preterm Abortions TAB SAB Ect Mult Living                  Review of Systems  Constitutional: Negative for fever and chills.  HENT: Negative for neck pain and neck stiffness.   Eyes: Negative for pain.  Respiratory: Negative for shortness of breath.   Cardiovascular: Negative for chest pain.  Gastrointestinal: Positive for diarrhea. Negative for vomiting, blood in stool, abdominal distention and rectal pain.  Genitourinary: Negative for dysuria.  Musculoskeletal: Negative for back pain.  Skin: Negative for rash.  Neurological: Negative for headaches.  All other systems reviewed and are negative.    Allergies   Ibuprofen; Metronidazole; and Ondansetron  Home Medications   Current Outpatient Rx  Name  Route  Sig  Dispense  Refill  . atorvastatin (LIPITOR) 20 MG tablet   Oral   Take 10 mg by mouth daily.          Marland Kitchen BIOTIN 5000 PO   Oral   Take 5,000 mcg by mouth daily.         . Cholecalciferol (VITAMIN D-3 PO)   Oral   Take 2,000 Units by mouth daily.         . hydrochlorothiazide (HYDRODIURIL) 25 MG tablet   Oral   Take 12.5 mg by mouth daily.          . Multiple Vitamin (MULITIVITAMIN WITH MINERALS) TABS   Oral   Take 1 tablet by mouth daily.         Marland Kitchen olmesartan (BENICAR) 40 MG tablet   Oral   Take 20 mg by mouth daily.          . potassium chloride (K-DUR) 10 MEQ tablet   Oral   Take 10 mEq by mouth daily.         Marland Kitchen PRESCRIPTION MEDICATION   Inhalation   Inhale 1 spray into the lungs daily as needed (nasal spray for congestion).  BP 115/59  Pulse 72  Temp(Src) 98.3 F (36.8 C) (Oral)  Resp 18  SpO2 98%  Physical Exam  Constitutional: She is oriented to person, place, and time. She appears well-developed and well-nourished.  HENT:  Head: Normocephalic and atraumatic.  Dry mm  Eyes: EOM are normal. Pupils are equal, round, and reactive to light. No scleral icterus.  Neck: Neck supple.  Cardiovascular: Normal rate, regular rhythm and intact distal pulses.   Pulmonary/Chest: Effort normal and breath sounds normal. No respiratory distress.  Abdominal: Soft. Bowel sounds are normal. She exhibits no distension. There is no rebound.  Mild epigastric tenderness, neg Murphys sign  Musculoskeletal: Normal range of motion. She exhibits no edema.  Neurological: She is alert and oriented to person, place, and time.  Skin: Skin is warm and dry.    ED Course  Procedures (including critical care time)  Results for orders placed during the hospital encounter of 01/01/13  CBC WITH DIFFERENTIAL      Result Value Range   WBC 8.8  4.0 - 10.5 K/uL    RBC 4.25  3.87 - 5.11 MIL/uL   Hemoglobin 13.0  12.0 - 15.0 g/dL   HCT 16.1 (*) 09.6 - 04.5 %   MCV 84.0  78.0 - 100.0 fL   MCH 30.6  26.0 - 34.0 pg   MCHC 36.4 (*) 30.0 - 36.0 g/dL   RDW 40.9  81.1 - 91.4 %   Platelets 247  150 - 400 K/uL   Neutrophils Relative 77  43 - 77 %   Neutro Abs 6.8  1.7 - 7.7 K/uL   Lymphocytes Relative 15  12 - 46 %   Lymphs Abs 1.4  0.7 - 4.0 K/uL   Monocytes Relative 7  3 - 12 %   Monocytes Absolute 0.6  0.1 - 1.0 K/uL   Eosinophils Relative 1  0 - 5 %   Eosinophils Absolute 0.1  0.0 - 0.7 K/uL   Basophils Relative 0  0 - 1 %   Basophils Absolute 0.0  0.0 - 0.1 K/uL  COMPREHENSIVE METABOLIC PANEL      Result Value Range   Sodium 129 (*) 135 - 145 mEq/L   Potassium 4.0  3.5 - 5.1 mEq/L   Chloride 91 (*) 96 - 112 mEq/L   CO2 23  19 - 32 mEq/L   Glucose, Bld 99  70 - 99 mg/dL   BUN 9  6 - 23 mg/dL   Creatinine, Ser 7.82  0.50 - 1.10 mg/dL   Calcium 8.8  8.4 - 95.6 mg/dL   Total Protein 6.7  6.0 - 8.3 g/dL   Albumin 3.6  3.5 - 5.2 g/dL   AST 33  0 - 37 U/L   ALT 10  0 - 35 U/L   Alkaline Phosphatase 52  39 - 117 U/L   Total Bilirubin 0.6  0.3 - 1.2 mg/dL   GFR calc non Af Amer 72 (*) >90 mL/min   GFR calc Af Amer 84 (*) >90 mL/min  LIPASE, BLOOD      Result Value Range   Lipase 35  11 - 59 U/L  URINALYSIS, MICROSCOPIC ONLY      Result Value Range   Color, Urine YELLOW  YELLOW   APPearance CLOUDY (*) CLEAR   Specific Gravity, Urine 1.018  1.005 - 1.030   pH 5.5  5.0 - 8.0   Glucose, UA NEGATIVE  NEGATIVE mg/dL   Hgb urine dipstick NEGATIVE  NEGATIVE  Bilirubin Urine SMALL (*) NEGATIVE   Ketones, ur >80 (*) NEGATIVE mg/dL   Protein, ur NEGATIVE  NEGATIVE mg/dL   Urobilinogen, UA 0.2  0.0 - 1.0 mg/dL   Nitrite NEGATIVE  NEGATIVE   Leukocytes, UA SMALL (*) NEGATIVE   WBC, UA 3-6  <3 WBC/hpf   RBC / HPF 0-2  <3 RBC/hpf   Bacteria, UA RARE  RARE   Squamous Epithelial / LPF RARE  RARE   Casts HYALINE CASTS (*) NEGATIVE  CG4 I-STAT  (LACTIC ACID)      Result Value Range   Lactic Acid, Venous 0.81  0.5 - 2.2 mmol/L      Date: 01/02/2013  Rate: 75  Rhythm: normal sinus rhythm  QRS Axis: left  Intervals: normal  ST/T Wave abnormalities: nonspecific T wave changes  Conduction Disutrbances:nonspecific intraventricular conduction delay  Narrative Interpretation:   Old EKG Reviewed: changes noted QRS sl inc versus previous ECG 02/27/12    IVFs, PO phenergan  2:25 AM ambulating to restroom after 2L IVfs and feeling much better, unable to provide stool sample - no diarrhea in ER. PT comfortbale with plan keep f/u GI.   MDM  Diarrhea x 2 weeks with dehydration and hyponatremia  Improved with phenergan and IVFs, Rx phenergan provided.   VS and nursing notes reviewed  Labs reviewed as above, normal lactate        Sunnie Nielsen, MD 01/02/13 [phone]

## 2013-01-01 NOTE — ED Notes (Signed)
Pt sent here from ucc for diarrhea x 2 weeks, vomiting that started yesterday, reports no appetite, poor intake. No acute distress noted at triage.

## 2013-01-01 NOTE — ED Provider Notes (Signed)
History     CSN: [id number]  Arrival date & time 01/01/13  1615   First MD Initiated Contact with Patient 01/01/13 1642      Chief Complaint  Patient presents with  . Emesis    (Consider location/radiation/quality/duration/timing/severity/associated sxs/prior treatment) Patient is a 72 y.o. female presenting with vomiting. The history is provided by the patient and the spouse.  Emesis Severity:  Mild Duration:  2 days Timing:  Intermittent Quality:  Stomach contents Progression:  Unchanged Chronicity:  New Associated symptoms: diarrhea   Associated symptoms: no fever   Associated symptoms comment:  Diarrhea for 2 weeks, given flagyl and zofran on mon by dr Waynard Edwards, , yest began with n/v , no blood, now weak feeling. Risk factors: no sick contacts and no suspect food intake     Past Medical History  Diagnosis Date  . Hypertension   . Hyperlipidemia   . Aortic insufficiency   . Vertigo   . Migraine   . Bicuspid aortic valve     Past Surgical History  Procedure Laterality Date  . Tubal ligation    . Benign tumor removal      from roof of mouth    Family History  Problem Relation Age of Onset  . Arrhythmia Mother   . Bone cancer Father     History  Substance Use Topics  . Smoking status: Never Smoker   . Smokeless tobacco: Not on file  . Alcohol Use: No    OB History   Grav Para Term Preterm Abortions TAB SAB Ect Mult Living                  Review of Systems  Constitutional: Positive for appetite change and fatigue.  Gastrointestinal: Positive for nausea, vomiting and diarrhea. Negative for blood in stool.    Allergies  Ibuprofen  Home Medications   Current Outpatient Rx  Name  Route  Sig  Dispense  Refill  . atorvastatin (LIPITOR) 20 MG tablet   Oral   Take 20 mg by mouth daily.         Marland Kitchen BIOTIN 5000 PO   Oral   Take 5,000 mcg by mouth daily.         . Cholecalciferol (VITAMIN D-3 PO)   Oral   Take 2,000 Units by mouth daily.          . hydrochlorothiazide (HYDRODIURIL) 25 MG tablet   Oral   Take 25 mg by mouth daily.         . Multiple Vitamin (MULITIVITAMIN WITH MINERALS) TABS   Oral   Take 1 tablet by mouth daily.         Marland Kitchen olmesartan (BENICAR) 40 MG tablet   Oral   Take 40 mg by mouth daily.         . potassium chloride (K-DUR) 10 MEQ tablet   Oral   Take 10 mEq by mouth daily.         . Probiotic Product (ALIGN PO)   Oral   Take by mouth. Once a Day for 3 weeks         . Risedronate Sodium (ATELVIA) 35 MG TBEC   Oral   Take 1 tablet by mouth every 7 (seven) days. Takes on Monday           BP 128/78  Pulse 79  Temp(Src) 97.8 F (36.6 C) (Oral)  Resp 20  SpO2 96%  Physical Exam  Nursing note and vitals reviewed. Constitutional: She  is oriented to person, place, and time. She appears well-developed and well-nourished. No distress.  HENT:  Head: Normocephalic.  Right Ear: External ear normal.  Left Ear: External ear normal.  Mouth/Throat: Oropharynx is clear and moist.  Neck: Normal range of motion. Neck supple.  Cardiovascular: Regular rhythm.   Pulmonary/Chest: Breath sounds normal.  Abdominal: Soft. Normal appearance and normal aorta. She exhibits no distension and no mass. Bowel sounds are increased. There is no hepatosplenomegaly. There is tenderness in the epigastric area. There is no rigidity, no rebound, no guarding and no CVA tenderness.  Lymphadenopathy:    She has no cervical adenopathy.  Neurological: She is alert and oriented to person, place, and time.  Skin: Skin is warm and dry.    ED Course  Procedures (including critical care time)  Labs Reviewed  POCT URINALYSIS DIP (DEVICE) - Abnormal; Notable for the following:    Bilirubin Urine MODERATE (*)    Ketones, ur 80 (*)    Hgb urine dipstick TRACE (*)    Protein, ur 100 (*)    Leukocytes, UA TRACE (*)    All other components within normal limits  POCT I-STAT, CHEM 8 - Abnormal; Notable for the  following:    Sodium 127 (*)    Chloride 92 (*)    Glucose, Bld 103 (*)    Calcium, Ion 1.12 (*)    All other components within normal limits   No results found.   1. Gastroenteritis, acute   2. Electrolyte and fluid disorder       MDM  i-stat 8 abnl. U/a abnl. Sent for eval of gi disturbance for 2 weeks and electrolyte imbalance, feeling weak, has lmd and dr Matthias Hughs for gi.        Linna Hoff, MD 01/01/13 [phone]

## 2013-01-01 NOTE — ED Notes (Signed)
Pt  Reports   About     2  Weeks  Of   Vomiting  /  Diarrhea           Worse  Over last  3  Days        Pt  Was placed        On  zofran  And      Flagyl  3  Days    Ago             She  Has  An appt    With a  Gastro  Tomorrow        She  Ambulated  To  Room with a  Steady  Fluid  Gait

## 2013-01-02 LAB — URINALYSIS, MICROSCOPIC ONLY
Glucose, UA: NEGATIVE mg/dL
Ketones, ur: >80 mg/dL — AB
Nitrite: NEGATIVE
Specific Gravity, Urine: 1.018 (ref 1.005–1.030)
pH: 5.5 (ref 5.0–8.0)

## 2013-01-02 LAB — CG4 I-STAT (LACTIC ACID): Lactic Acid, Venous: 0.81 mmol/L (ref 0.5–2.2)

## 2013-01-02 MED ORDER — SODIUM CHLORIDE 0.9 % IV BOLUS (SEPSIS)
1000.0000 mL | Freq: Once | INTRAVENOUS | Status: AC
  Administered 2013-01-02: 1000 mL via INTRAVENOUS

## 2013-01-02 MED ORDER — LOPERAMIDE HCL 2 MG PO CAPS
2.0000 mg | ORAL_CAPSULE | Freq: Once | ORAL | Status: DC
  Filled 2013-01-02: qty 1

## 2013-01-02 MED ORDER — PROMETHAZINE HCL 25 MG PO TABS: 25.0000 mg | ORAL_TABLET | Freq: Four times a day (QID) | ORAL | Status: DC | PRN

## 2013-01-03 ENCOUNTER — Emergency Department (HOSPITAL_COMMUNITY)
Admission: EM | Admit: 2013-01-03 | Discharge: 2013-01-04 | Disposition: A | Discharge status: Home / Self Care | Payer: Medicare Other | Attending: Emergency Medicine | Admitting: Emergency Medicine

## 2013-01-03 ENCOUNTER — Encounter (HOSPITAL_COMMUNITY): Payer: Self-pay | Admitting: *Deleted

## 2013-01-03 DIAGNOSIS — Z79899 Other long term (current) drug therapy: Secondary | ICD-10-CM | POA: Insufficient documentation

## 2013-01-03 DIAGNOSIS — Z8719 Personal history of other diseases of the digestive system: Secondary | ICD-10-CM | POA: Insufficient documentation

## 2013-01-03 DIAGNOSIS — R110 Nausea: Secondary | ICD-10-CM | POA: Insufficient documentation

## 2013-01-03 DIAGNOSIS — I10 Essential (primary) hypertension: Secondary | ICD-10-CM | POA: Insufficient documentation

## 2013-01-03 DIAGNOSIS — E785 Hyperlipidemia, unspecified: Secondary | ICD-10-CM | POA: Insufficient documentation

## 2013-01-03 DIAGNOSIS — Z8679 Personal history of other diseases of the circulatory system: Secondary | ICD-10-CM | POA: Insufficient documentation

## 2013-01-03 DIAGNOSIS — R197 Diarrhea, unspecified: Secondary | ICD-10-CM

## 2013-01-03 HISTORY — DX: Collagenous colitis: K52.831

## 2013-01-03 LAB — URINALYSIS, ROUTINE W REFLEX MICROSCOPIC
Glucose, UA: NEGATIVE mg/dL
Hgb urine dipstick: NEGATIVE
Leukocytes, UA: NEGATIVE
Specific Gravity, Urine: 1.017 (ref 1.005–1.030)
Urobilinogen, UA: 0.2 mg/dL (ref 0.0–1.0)

## 2013-01-03 LAB — POCT I-STAT, CHEM 8
BUN: 6 mg/dL (ref 6–23)
Calcium, Ion: 1.12 mmol/L — ABNORMAL LOW (ref 1.13–1.30)
Chloride: 104 mEq/L (ref 96–112)
HCT: 34 % — ABNORMAL LOW (ref 36.0–46.0)
Sodium: 133 mEq/L — ABNORMAL LOW (ref 135–145)
TCO2: 20 mmol/L (ref 0–100)

## 2013-01-03 MED ORDER — SODIUM CHLORIDE 0.9 % IV BOLUS (SEPSIS)
1000.0000 mL | Freq: Once | INTRAVENOUS | Status: AC
  Administered 2013-01-04: 1000 mL via INTRAVENOUS

## 2013-01-03 MED ORDER — SODIUM CHLORIDE 0.9 % IV SOLN: INTRAVENOUS | Status: DC

## 2013-01-03 NOTE — ED Provider Notes (Signed)
History     CSN: [id number]  Arrival date & time 01/03/13  5621   First MD Initiated Contact with Patient 01/03/13 2257      Chief Complaint  Patient presents with  . Nausea and Diarrhea     (Consider location/radiation/quality/duration/timing/severity/associated sxs/prior treatment) HPI This is a 72 year old female with nausea and diarrhea that began about 2-1/2 weeks ago. The diarrhea is described as watery. She has a history of collagenous colitis but states that her current symptoms are not like those associated with the collagenous colitis. She was treated about a week ago with Flagyl for 3 days without relief. She was then sent to our urgent care where she was evaluated and then transferred to the emergency department. She was found to be hyponatremic and was given IV fluids and Phenergan for nausea. Since being discharged home her diarrhea has worsened and she is having countless watery stools throughout the day. Her nausea has persisted but waxes and wanes, sometimes being relieved by the Phenergan she was prescribed; her nausea is not present currently. She has not vomited. She has not had a fever. She has had some mild abdominal cramping with the diarrhea but no persistent or localized pain. She does feel weak and has had no appetite.  Past Medical History  Diagnosis Date  . Hypertension   . Hyperlipidemia   . Aortic insufficiency   . Vertigo   . Migraine   . Bicuspid aortic valve   . Colitis, collagenous summer of 2013    Past Surgical History  Procedure Laterality Date  . Tubal ligation    . Benign tumor removal      from roof of mouth    Family History  Problem Relation Age of Onset  . Arrhythmia Mother   . Bone cancer Father     History  Substance Use Topics  . Smoking status: Never Smoker   . Smokeless tobacco: Not on file  . Alcohol Use: No    OB History   Grav Para Term Preterm Abortions TAB SAB Ect Mult Living                  Review of Systems   All other systems reviewed and are negative.    Allergies  Ibuprofen; Metronidazole; and Ondansetron  Home Medications   Current Outpatient Rx  Name  Route  Sig  Dispense  Refill  . atorvastatin (LIPITOR) 20 MG tablet   Oral   Take 10 mg by mouth daily.          Marland Kitchen BIOTIN 5000 PO   Oral   Take 5,000 mcg by mouth daily.         . Cholecalciferol (VITAMIN D-3 PO)   Oral   Take 2,000 Units by mouth daily.         . hydrochlorothiazide (HYDRODIURIL) 25 MG tablet   Oral   Take 12.5 mg by mouth daily.          . indapamide (LOZOL) 2.5 MG tablet   Oral   Take 2.5 mg by mouth daily.         . Multiple Vitamin (MULITIVITAMIN WITH MINERALS) TABS   Oral   Take 1 tablet by mouth daily.         Marland Kitchen olmesartan (BENICAR) 40 MG tablet   Oral   Take 20 mg by mouth daily.          . potassium chloride (K-DUR) 10 MEQ tablet   Oral   Take  10 mEq by mouth daily.         Marland Kitchen PRESCRIPTION MEDICATION   Inhalation   Inhale 1 spray into the lungs daily as needed (nasal spray for congestion).         . promethazine (PHENERGAN) 25 MG tablet   Oral   Take 1 tablet (25 mg total) by mouth every 6 (six) hours as needed for nausea.   30 tablet   0     BP 142/67  Pulse 88  Temp(Src) 97.7 F (36.5 C) (Oral)  Resp 20  SpO2 100%  Physical Exam General: Well-developed, well-nourished female in no acute distress; appearance consistent with age of record HENT: normocephalic, atraumatic; dry mucous membranes Eyes: pupils equal round and reactive to light; extraocular muscles intact Neck: supple Heart: regular rate and rhythm Lungs: clear to auscultation bilaterally Abdomen: soft; nondistended; nontender; no masses or hepatosplenomegaly; bowel sounds hyperactive Extremities: No deformity; full range of motion Neurologic: Awake, alert and oriented; motor function intact in all extremities and symmetric; no facial droop Skin: Warm and dry Psychiatric: Normal mood and  affect    ED Course  Procedures (including critical care time)     MDM  Nursing notes and vitals signs, including pulse oximetry, reviewed.  Summary of this visit's results, reviewed by myself:  Labs:  Results for orders placed during the hospital encounter of 01/03/13 (from the past 24 hour(s))  CBC WITH DIFFERENTIAL     Status: Abnormal   Collection Time    01/03/13 11:08 PM      Result Value Range   WBC 7.8  4.0 - 10.5 K/uL   RBC 4.06  3.87 - 5.11 MIL/uL   Hemoglobin 12.5  12.0 - 15.0 g/dL   HCT 62.1 (*) 30.8 - 65.7 %   MCV 85.0  78.0 - 100.0 fL   MCH 30.8  26.0 - 34.0 pg   MCHC 36.2 (*) 30.0 - 36.0 g/dL   RDW 84.6  96.2 - 95.2 %   Platelets 231  150 - 400 K/uL   Neutrophils Relative 58  43 - 77 %   Neutro Abs 4.6  1.7 - 7.7 K/uL   Lymphocytes Relative 30  12 - 46 %   Lymphs Abs 2.3  0.7 - 4.0 K/uL   Monocytes Relative 11  3 - 12 %   Monocytes Absolute 0.9  0.1 - 1.0 K/uL   Eosinophils Relative 1  0 - 5 %   Eosinophils Absolute 0.1  0.0 - 0.7 K/uL   Basophils Relative 0  0 - 1 %   Basophils Absolute 0.0  0.0 - 0.1 K/uL  URINALYSIS, ROUTINE W REFLEX MICROSCOPIC     Status: Abnormal   Collection Time    01/03/13 11:42 PM      Result Value Range   Color, Urine YELLOW  YELLOW   APPearance CLEAR  CLEAR   Specific Gravity, Urine 1.017  1.005 - 1.030   pH 5.0  5.0 - 8.0   Glucose, UA NEGATIVE  NEGATIVE mg/dL   Hgb urine dipstick NEGATIVE  NEGATIVE   Bilirubin Urine SMALL (*) NEGATIVE   Ketones, ur 40 (*) NEGATIVE mg/dL   Protein, ur NEGATIVE  NEGATIVE mg/dL   Urobilinogen, UA 0.2  0.0 - 1.0 mg/dL   Nitrite NEGATIVE  NEGATIVE   Leukocytes, UA NEGATIVE  NEGATIVE  POCT I-STAT, CHEM 8     Status: Abnormal   Collection Time    01/03/13 11:47 PM      Result  Value Range   Sodium 133 (*) 135 - 145 mEq/L   Potassium 3.4 (*) 3.5 - 5.1 mEq/L   Chloride 104  96 - 112 mEq/L   BUN 6  6 - 23 mg/dL   Creatinine, Ser 1.61  0.50 - 1.10 mg/dL   Glucose, Bld 90  70 - 99 mg/dL    Calcium, Ion 0.96 (*) 1.13 - 1.30 mmol/L   TCO2 20  0 - 100 mmol/L   Hemoglobin 11.6 (*) 12.0 - 15.0 g/dL   HCT 04.5 (*) 40.9 - 81.1 %      11:26 PM A sample of the patient's diarrhea was evaluated. It is liquid and has the appearance of spinach soup.  2:36 AM The patient is feeling better after IV hydration. Her electrolytes are not significantly abnormal. She is having no pain at this time. There does not appear to be any indication for admission. She will followup with Dr. Matthias Hughs the day after tomorrow. She was advised that this may represent her collagenous colitis or some other pathology that will require colonoscopy for definitive diagnosis. She states the Phenergan has been controlling her nausea fairly well and Dr. Matthias Hughs told her she can take Imodium for diarrhea.    Hanley Seamen, MD 01/04/13 [phone]

## 2013-01-03 NOTE — ED Notes (Signed)
Pt was here on wed for same. Initially felt better but now having symptoms again, nausea, no vomiting, no appetite and still having watery diarrhea. No acute distress noted at triage.

## 2013-01-04 LAB — CLOSTRIDIUM DIFFICILE BY PCR: Toxigenic C. Difficile by PCR: NEGATIVE

## 2013-01-04 LAB — CBC WITH DIFFERENTIAL/PLATELET
Hemoglobin: 12.5 g/dL (ref 12.0–15.0)
Lymphocytes Relative: 30 % (ref 12–46)
Lymphs Abs: 2.3 10*3/uL (ref 0.7–4.0)
Monocytes Relative: 11 % (ref 3–12)
Neutro Abs: 4.6 10*3/uL (ref 1.7–7.7)
Neutrophils Relative %: 58 % (ref 43–77)
Platelets: 231 10*3/uL (ref 150–400)
RBC: 4.06 MIL/uL (ref 3.87–5.11)
WBC: 7.8 10*3/uL (ref 4.0–10.5)

## 2013-01-04 MED ORDER — POTASSIUM CHLORIDE CRYS ER 20 MEQ PO TBCR
40.0000 meq | EXTENDED_RELEASE_TABLET | Freq: Once | ORAL | Status: AC
  Administered 2013-01-04: 40 meq via ORAL
  Filled 2013-01-04: qty 2

## 2013-01-06 ENCOUNTER — Inpatient Hospital Stay (HOSPITAL_COMMUNITY): Payer: Medicare Other

## 2013-01-06 ENCOUNTER — Telehealth: Payer: Self-pay | Admitting: Internal Medicine

## 2013-01-06 ENCOUNTER — Encounter (HOSPITAL_COMMUNITY): Payer: Self-pay | Admitting: General Practice

## 2013-01-06 ENCOUNTER — Inpatient Hospital Stay (HOSPITAL_COMMUNITY)
Admission: AD | Admit: 2013-01-06 | Discharge: 2013-01-09 | DRG: 391 | Disposition: A | Discharge status: Home / Self Care | Payer: Medicare Other | Source: Ambulatory Visit | Attending: Internal Medicine | Admitting: Internal Medicine

## 2013-01-06 DIAGNOSIS — I10 Essential (primary) hypertension: Secondary | ICD-10-CM

## 2013-01-06 DIAGNOSIS — Z888 Allergy status to other drugs, medicaments and biological substances status: Secondary | ICD-10-CM

## 2013-01-06 DIAGNOSIS — E876 Hypokalemia: Secondary | ICD-10-CM

## 2013-01-06 DIAGNOSIS — Z79899 Other long term (current) drug therapy: Secondary | ICD-10-CM

## 2013-01-06 DIAGNOSIS — E860 Dehydration: Secondary | ICD-10-CM

## 2013-01-06 DIAGNOSIS — E872 Acidosis, unspecified: Secondary | ICD-10-CM | POA: Diagnosis present

## 2013-01-06 DIAGNOSIS — Z886 Allergy status to analgesic agent status: Secondary | ICD-10-CM

## 2013-01-06 DIAGNOSIS — R197 Diarrhea, unspecified: Secondary | ICD-10-CM

## 2013-01-06 DIAGNOSIS — E43 Unspecified severe protein-calorie malnutrition: Secondary | ICD-10-CM | POA: Diagnosis present

## 2013-01-06 DIAGNOSIS — E785 Hyperlipidemia, unspecified: Secondary | ICD-10-CM

## 2013-01-06 DIAGNOSIS — I2582 Chronic total occlusion of coronary artery: Secondary | ICD-10-CM | POA: Diagnosis present

## 2013-01-06 DIAGNOSIS — Z681 Body mass index (BMI) 19 or less, adult: Secondary | ICD-10-CM

## 2013-01-06 DIAGNOSIS — K5730 Diverticulosis of large intestine without perforation or abscess without bleeding: Secondary | ICD-10-CM | POA: Diagnosis present

## 2013-01-06 DIAGNOSIS — M19049 Primary osteoarthritis, unspecified hand: Secondary | ICD-10-CM | POA: Diagnosis present

## 2013-01-06 DIAGNOSIS — E871 Hypo-osmolality and hyponatremia: Secondary | ICD-10-CM

## 2013-01-06 DIAGNOSIS — K625 Hemorrhage of anus and rectum: Secondary | ICD-10-CM | POA: Diagnosis present

## 2013-01-06 DIAGNOSIS — R1011 Right upper quadrant pain: Secondary | ICD-10-CM

## 2013-01-06 DIAGNOSIS — Z9089 Acquired absence of other organs: Secondary | ICD-10-CM

## 2013-01-06 DIAGNOSIS — Q2381 Bicuspid aortic valve: Secondary | ICD-10-CM

## 2013-01-06 DIAGNOSIS — R002 Palpitations: Secondary | ICD-10-CM

## 2013-01-06 DIAGNOSIS — R112 Nausea with vomiting, unspecified: Secondary | ICD-10-CM

## 2013-01-06 DIAGNOSIS — K219 Gastro-esophageal reflux disease without esophagitis: Secondary | ICD-10-CM | POA: Diagnosis present

## 2013-01-06 DIAGNOSIS — K5289 Other specified noninfective gastroenteritis and colitis: Principal | ICD-10-CM | POA: Diagnosis present

## 2013-01-06 DIAGNOSIS — R42 Dizziness and giddiness: Secondary | ICD-10-CM

## 2013-01-06 DIAGNOSIS — N179 Acute kidney failure, unspecified: Secondary | ICD-10-CM

## 2013-01-06 DIAGNOSIS — Q231 Congenital insufficiency of aortic valve: Secondary | ICD-10-CM

## 2013-01-06 HISTORY — DX: Gastro-esophageal reflux disease without esophagitis: K21.9

## 2013-01-06 HISTORY — DX: Unspecified osteoarthritis, unspecified site: M19.90

## 2013-01-06 HISTORY — DX: Pneumonia, unspecified organism: J18.9

## 2013-01-06 LAB — COMPREHENSIVE METABOLIC PANEL
ALT: 9 U/L (ref 0–35)
AST: 20 U/L (ref 0–37)
Albumin: 3.5 g/dL (ref 3.5–5.2)
Alkaline Phosphatase: 50 U/L (ref 39–117)
CO2: 18 mEq/L — ABNORMAL LOW (ref 19–32)
Chloride: 99 mEq/L (ref 96–112)
Creatinine, Ser: 2.23 mg/dL — ABNORMAL HIGH (ref 0.50–1.10)
GFR calc non Af Amer: 21 mL/min — ABNORMAL LOW (ref >90)
Potassium: 4.2 mEq/L (ref 3.5–5.1)
Total Bilirubin: 0.4 mg/dL (ref 0.3–1.2)

## 2013-01-06 LAB — DIFFERENTIAL
Basophils Absolute: 0 10*3/uL (ref 0.0–0.1)
Basophils Relative: 0 % (ref 0–1)
Eosinophils Absolute: 0.1 10*3/uL (ref 0.0–0.7)
Eosinophils Relative: 1 % (ref 0–5)
Monocytes Absolute: 1.1 10*3/uL — ABNORMAL HIGH (ref 0.1–1.0)
Monocytes Relative: 11 % (ref 3–12)
Neutro Abs: 6.7 10*3/uL (ref 1.7–7.7)

## 2013-01-06 LAB — CBC
MCV: 82.5 fL (ref 78.0–100.0)
Platelets: 315 10*3/uL (ref 150–400)
RBC: 4.56 MIL/uL (ref 3.87–5.11)
RDW: 13.5 % (ref 11.5–15.5)
WBC: 10.1 10*3/uL (ref 4.0–10.5)

## 2013-01-06 LAB — URINALYSIS, ROUTINE W REFLEX MICROSCOPIC
Ketones, ur: 15 mg/dL — AB
Leukocytes, UA: NEGATIVE
Nitrite: NEGATIVE
Protein, ur: 30 mg/dL — AB
Urobilinogen, UA: 0.2 mg/dL (ref 0.0–1.0)

## 2013-01-06 LAB — URINE MICROSCOPIC-ADD ON

## 2013-01-06 MED ORDER — ACETAMINOPHEN 650 MG RE SUPP: 650.0000 mg | Freq: Four times a day (QID) | RECTAL | Status: DC | PRN

## 2013-01-06 MED ORDER — ALUM & MAG HYDROXIDE-SIMETH 200-200-20 MG/5ML PO SUSP: 30.0000 mL | Freq: Four times a day (QID) | ORAL | Status: DC | PRN

## 2013-01-06 MED ORDER — POTASSIUM CHLORIDE IN NACL 40-0.9 MEQ/L-% IV SOLN
INTRAVENOUS | Status: DC
  Administered 2013-01-06 – 2013-01-07 (×2): via INTRAVENOUS
  Filled 2013-01-06 (×4): qty 1000

## 2013-01-06 MED ORDER — ACETAMINOPHEN 325 MG PO TABS: 650.0000 mg | ORAL_TABLET | Freq: Four times a day (QID) | ORAL | Status: DC | PRN

## 2013-01-06 MED ORDER — PROMETHAZINE HCL 25 MG PO TABS: 25.0000 mg | ORAL_TABLET | Freq: Four times a day (QID) | ORAL | Status: DC | PRN

## 2013-01-06 NOTE — Progress Notes (Signed)
1230 Patient arrived to floor from home. Alert/orientedx4. Skin WNL  nontelemetry

## 2013-01-06 NOTE — H&P (Signed)
[facility] History and Physical  Bailey [name] ZOX:[id number] DOB: Dec 15, 1940 DOA: 01/06/2013  Referring physician: Dr. Waynard Edwards PCP: Ezequiel Kayser, MD  Specialists: Dr. Matthias Hughs  Chief Complaint: Diarrhea/abdominal pain/vomiting  HPI: Bailey [name] is a 72 y.o. female with a history of collagenous colitis,  bicuspid aortic valve, vertigo, htn, hld who presents to [facility] as a direct admission.  She reports intermittent diarrhea for the past 3 weeks.  Prior to this illness she had a formed stool once or twice a day.  Now she is having numerous bowel movements with small amounts of watery diarrhea.  Her BMs are sometimes green or very dark in color.  She has also had 1 or 2 episodes of BRBPR recently, and describes abdominal pain in her inguinal area as well as her right upper quadrant.  She was seen in the Emergency Department on 3/12 and treated with Flagyl and Zofran.  Her diarrhea has not improved with 5 days of Flagyl outpatient.  (note she has an allergy to Zofran).  She has had occasional vomiting but is currently able to tolerate liquids.  She complains of palpitations when exerting herself.   Dr. Matthias Hughs performed Upper Endoscopy and Colonoscopy in April of 2013.  The EGD was essentially negative and the Colonoscopy resulted in findings consistent with collagenous colitis.    Review of Systems:  She endorses "heart pounding" when she attempts to exert herself;  Slight headache;  Feeling weak and dizzy, and 1 or 2 episodes of small amounts of BRBPR.  She denies:  Changes in vision, cough, fever, chest pain, SOB, difficulty urinating.  All other systems were reviewed and found to be negative.  Past Medical History  Diagnosis Date  . Hypertension   . Hyperlipidemia   . Aortic insufficiency   . Vertigo   . Migraine   . Bicuspid aortic valve   . Colitis, collagenous summer of 2013   Past Surgical History  Procedure Laterality Date  . Tubal ligation    . Benign tumor  removal      from roof of mouth   Social History: Never used tobacco, never used alcohol.  Married 51 years & lives with her husband.  Mother is in a nursing home and has CHF, Father died at age 51 with bone cancer.  Brother passed young (20s) with brain cancer, sister has RA.  Mother and sister have both had cholecystectomy.  Allergies  Allergen Reactions  . Ibuprofen Nausea And Vomiting    vertigo  . Metronidazole Diarrhea and Nausea And Vomiting  . Ondansetron Nausea And Vomiting    Family History  Problem Relation Age of Onset  . Arrhythmia Mother   . Bone cancer Father     (be sure to complete)  Prior to Admission medications   Medication Sig Start Date End Date Taking? Authorizing Provider  atorvastatin (LIPITOR) 20 MG tablet Take 10 mg by mouth daily.     Historical Provider, MD  BIOTIN 5000 PO Take 5,000 mcg by mouth daily.    Historical Provider, MD  Cholecalciferol (VITAMIN D-3 PO) Take 2,000 Units by mouth daily.    Historical Provider, MD  hydrochlorothiazide (HYDRODIURIL) 25 MG tablet Take 12.5 mg by mouth daily.     Historical Provider, MD  indapamide (LOZOL) 2.5 MG tablet Take 2.5 mg by mouth daily.    Historical Provider, MD  Multiple Vitamin (MULITIVITAMIN WITH MINERALS) TABS Take 1 tablet by mouth daily.    Historical Provider, MD  olmesartan [facility])  40 MG tablet Take 20 mg by mouth daily.     Historical Provider, MD  potassium chloride (K-DUR) 10 MEQ tablet Take 10 mEq by mouth daily.    Historical Provider, MD  PRESCRIPTION MEDICATION Inhale 1 spray into the lungs daily as needed (nasal spray for congestion).    Historical Provider, MD  promethazine (PHENERGAN) 25 MG tablet Take 1 tablet (25 mg total) by mouth every 6 (six) hours as needed for nausea. 01/02/13   Sunnie Nielsen, MD   Physical Exam: Filed Vitals:   01/06/13 1209  BP: 102/67  Pulse: 79  Temp: 96.8 F (36 C)  TempSrc: Axillary  Resp: 18  Height: 5\' 3"  (1.6 m)  Weight: 46.448 kg (102 lb 6.4 oz)   SpO2: 99%     General:  Well developed, thin, female, appears younger than stated age, lying comfortably in bed.  Eyes: PEERLA, no jaundice  ENT: Oropharynx pink without exudates.  Nares without discharge or swelling  Neck: Supple, no lymphadenopathy, no thyromegaly.  Cardiovascular: rrr, no m/r/g, no lower extremity edema.  Respiratory: CTA, no w/c/r, no accessory muscle use  Abdomen: thin, soft, nd, nt, +BS, no masses, neg murphy's sign, no rebound.   Skin: no rashes, bruises, lesions  Musculoskeletal: able to move all 4 ext.  5/5 strength in each  Psychiatric: A&O, NAD, Cooperative, well groomed.  Neurologic: CN 2 - 12 grossly in tact, Non focal.  Labs on Admission:  Basic Metabolic Panel:  Recent Labs Lab 01/01/13 1742 01/01/13 1850 01/03/13 2347  NA 127* 129* 133*  K 3.7 4.0 3.4*  CL 92* 91* 104  CO2  --  23  --   GLUCOSE 103* 99 90  BUN 8 9 6   CREATININE 1.00 0.80 0.80  CALCIUM  --  8.8  --    Liver Function Tests:  Recent Labs Lab 01/01/13 1850  AST 33  ALT 10  ALKPHOS 52  BILITOT 0.6  PROT 6.7  ALBUMIN 3.6    Recent Labs Lab 01/01/13 1850  LIPASE 35   CBC:  Recent Labs Lab 01/01/13 1742 01/01/13 1850 01/03/13 2308 01/03/13 2347  WBC  --  8.8 7.8  --   NEUTROABS  --  6.8 4.6  --   HGB 12.6 13.0 12.5 11.6*  HCT 37.0 35.7* 34.5* 34.0*  MCV  --  84.0 85.0  --   PLT  --  247 231  --     EKG: Independently reviewed. pending  Assessment/Plan Active Problems:   Diarrhea   Nausea with vomiting   Abdominal pain, right upper quadrant   Palpitations   Bright red blood per rectum   Dehydration   Hypokalemia   Hyponatremia   Diarrhea with N/V and abdominal pain Suspect viral gastroenteritis in the setting of collagenous colitis aggravated by flagyl. c-diff negative on 3/12 Will recheck c-diff pcr. Failed outpatient treatment with flagyl Dr. Matthias Hughs, [facility] Consulted (Thank you) Will hold antibiotics for now (WBC within  normal limits, afebrile) Dr. B will likely perform an unprepped colonoscopy 3/18. Will treat n/v supportively with IVF, Phenergan (allergic to zofran)  Abdominal Pain In bilateral inguinal areas and right upper quadrant. Radiates to her back. LFTs normal Check abdominal U/S.  Dehydration Based on clinical exam and mild hyponatremia NS IVF at 100 ml/hour Check bmet in am. Clear liquid diet after Ultrasound  Palpitations Check EKG Likely due to dehydration  Hyponatremia Secondary to dehydration IVF and recheck bmet in am.  Hypokalemia Secondary to GI losses.  K in IVF.   Recheck k in am.  Bright red Blood per rectum Hgb baseline 11.5 - 12.0.  No decrease in hgb. ?irritation vs hemorrhoids.   [facility]:  Dr. Matthias Hughs consulted.    Code Status: full Family Communication: husband at bedside Disposition Plan: inpatient, home when able.  Time spent: 45 min  Conley Canal [facility] Pager [phone]  If 7PM-7AM, please contact night-coverage [email or web address] Password [facility] 01/06/2013, 2:15 PM  Attending Patient seen and examined, agree with the assessment and plan as outlined above. Labs show ARF-likely pre-renal from persistent diarrhea. Unpreppred colonoscopy tomorrow. Recent C Diff was negative-no role for empiric C Diff coverage. Hydrate overnight and recheck lytes in am  S Ghimire

## 2013-01-06 NOTE — Care Management Note (Signed)
    Page 1 of 1   01/09/2013     10:25:03 AM   CARE MANAGEMENT NOTE 01/09/2013  Patient:  Bailey [name], Bailey [name]   Account Number:  [id number]  Date Initiated:  01/06/2013  Documentation initiated by:  Letha Cape  Subjective/Objective Assessment:   dx diarrhea/vomiting/abd pain  admit-     Action/Plan:   pt eval-no pt needs   Anticipated DC Date:  01/09/2013   Anticipated DC Plan:  HOME/SELF CARE      DC Planning Services  CM consult      Choice offered to / List presented to:             Status of service:  Completed, signed off Medicare Important Message given?   (If response is "NO", the following Medicare IM given date fields will be blank) Date Medicare IM given:   Date Additional Medicare IM given:    Discharge Disposition:  HOME/SELF CARE  Per UR Regulation:  Reviewed for med. necessity/level of care/duration of stay  If discussed at Long Length of Stay Meetings, dates discussed:    Comments:  01/09/13 10:23 Letha Cape RN, BSN [phone] patient for dc today,per physical therapy patient has no pt needs.  01/06/13 15:35 Letha Cape RN, BSN [phone] patient admitted with n/v/d and abd pain, await pt eval.

## 2013-01-06 NOTE — Consult Note (Signed)
Referring Provider: Dr. Jerral Ralph ([facility]) Primary Care Physician:  Ezequiel Kayser, MD Primary Gastroenterologist:  Dr. Matthias Hughs   Reason for Consultation:  Diarrhea  HPI: Bailey [name] is a 72 y.o. female admitted to the hospital today following a several week prodrome of intermittent nausea and vomiting, and moderately severe diarrhea.  Interestingly, there is a past history of collagenous colitis diagnosed colonoscopically a year ago, but with such a good response to therapy that she has been off treatment entirely for about the past 6 months. She was doing fine, without diarrhea, until about 3 or 4 weeks ago.  For the past month or so, the patient has had problems with periodic nausea and vomiting, although she has not vomited for about a week now. Even more so has been her problem with diarrhea, averaging between 7 and 10 watery bowel movements per day, no visible blood although she was occultly heme positive when I checked her in the office 4 days ago.  A recent stool specimen for C. difficile was negative by PCR.  Institution of probiotics, recent cholestyramine (for possible recurrence of her collagenous colitis), and Zofran has not really helped the patient that much. She has almost no desire to eat. She has gotten weaker and weaker and in fact, presented today with a marked elevation of her creatinine, consistent with dehydration. There has not been any problem with significant abdominal pain, although she does report some abdominal "soreness."  She received 2 L of IV fluids as an outpatient at the urgent care center about 5 days ago, returned to the emergency room a couple of days later but was released, and finally, after discussion with the patient's husband on the phone today, it was felt that directed vision to the hospital was appropriate because of the absence of improvement with attempts at outpatient therapy, and the absence of a clear diagnosis.  Past Medical History   Diagnosis Date  . Hypertension   . Hyperlipidemia   . Aortic insufficiency   . Vertigo   . Bicuspid aortic valve   . Colitis, collagenous summer of 2013  . Pneumonia ~ 1944; ~ 2009  . GERD (gastroesophageal reflux disease)     "/endoscopy; doesn't bother me at all" (01/06/2013)  . Migraine     "used to have once/month; none in years" (01/06/2013)  . Arthritis     "hands" (01/06/2013)    Past Surgical History  Procedure Laterality Date  . Tonsillectomy      "when I was little" (01/06/2013)  . Tubal ligation Bilateral 1970's  . Tumor removal  1990's & 06/23/2012    "benign; roof of mouth" (01/06/2013)  . Thyroidectomy, partial      "removed gland from left side of neck that was to go down and past thyroid in front" (01/06/2013)    Prior to Admission medications   Medication Sig Start Date End Date Taking? Authorizing Provider  atorvastatin (LIPITOR) 20 MG tablet Take 10 mg by mouth daily.    Yes Historical Provider, MD  BIOTIN 5000 PO Take 5,000 mcg by mouth daily.   Yes Historical Provider, MD  Cholecalciferol (VITAMIN D-3 PO) Take 2,000 Units by mouth daily.   Yes Historical Provider, MD  hydrochlorothiazide (HYDRODIURIL) 25 MG tablet Take 12.5 mg by mouth daily.    Yes Historical Provider, MD  indapamide (LOZOL) 2.5 MG tablet Take 2.5 mg by mouth daily.   Yes Historical Provider, MD  Multiple Vitamin (MULITIVITAMIN WITH MINERALS) TABS Take 1 tablet by mouth daily.  Yes Historical Provider, MD  olmesartan (BENICAR) 40 MG tablet Take 20 mg by mouth daily.    Yes Historical Provider, MD  potassium chloride (K-DUR) 10 MEQ tablet Take 10 mEq by mouth daily.   Yes Historical Provider, MD  PRESCRIPTION MEDICATION Inhale 1 spray into the lungs daily as needed (nasal spray for congestion).   Yes Historical Provider, MD  promethazine (PHENERGAN) 25 MG tablet Take 1 tablet (25 mg total) by mouth every 6 (six) hours as needed for nausea. 01/02/13  Yes Sunnie Nielsen, MD    Current  Facility-Administered Medications  Medication Dose Route Frequency Provider Last Rate Last Dose  . 0.9 % NaCl with KCl 40 mEq / L  infusion   Intravenous Continuous Stephani Police, PA-C 100 mL/hr at 01/06/13 1704    . acetaminophen (TYLENOL) tablet 650 mg  650 mg Oral Q6H PRN Stephani Police, PA-C       Or  . acetaminophen (TYLENOL) suppository 650 mg  650 mg Rectal Q6H PRN Stephani Police, PA-C      . alum & mag hydroxide-simeth (MAALOX/MYLANTA) 200-200-20 MG/5ML suspension 30 mL  30 mL Oral Q6H PRN Stephani Police, PA-C      . promethazine (PHENERGAN) tablet 25 mg  25 mg Oral Q6H PRN Stephani Police, PA-C        Allergies as of 01/06/2013 - Review Complete 01/06/2013  Allergen Reaction Noted  . Ibuprofen Nausea And Vomiting 01/15/2012  . Metronidazole Diarrhea and Nausea And Vomiting 01/01/2013  . Ondansetron Nausea And Vomiting 01/01/2013    Family History  Problem Relation Age of Onset  . Arrhythmia Mother   . Bone cancer Father     History   Social History  . Marital Status: Married    Spouse Name: N/A    Number of Children: N/A  . Years of Education: N/A   Occupational History  . Not on file.   Social History Main Topics  . Smoking status: Never Smoker   . Smokeless tobacco: Never Used  . Alcohol Use: No  . Drug Use: No  . Sexually Active: Yes   Other Topics Concern  . Not on file   Social History Narrative  . No narrative on file    Review of Systems: Please see history of present illness    Physical Exam: Vital signs in last 24 hours: Temp:  [96.8 F (36 C)] 96.8 F (36 C) (03/17 1209) Pulse Rate:  [79] 79 (03/17 1209) Resp:  [18] 18 (03/17 1209) BP: (102)/(67) 102/67 mmHg (03/17 1209) SpO2:  [99 %] 99 % (03/17 1209) Weight:  [46.448 kg (102 lb 6.4 oz)] 46.448 kg (102 lb 6.4 oz) (03/17 1209) Last BM Date: 01/06/13 General:   Alert,  Well-developed,  appears thin but non-cachectic, pleasant and cooperative in NAD Head:  Normocephalic and  atraumatic. Eyes:  Sclera clear, no icterus.   Conjunctiva pink. Mouth:   No ulcerations or lesions.  Oropharynx pink & moist. Neck:   No masses or thyromegaly. Lungs:  Clear throughout to auscultation.   No wheezes, crackles, or rhonchi. No evident respiratory distress. Heart:   Regular rate and rhythm; no murmurs, clicks, rubs,  or gallops. Abdomen:  Soft, nontender,  some tympany in the right lower quadrant, but nondistended. No masses, hepatosplenomegaly or ventral hernias noted. Normal bowel sounds, without bruits, guarding, or rebound.   Msk:   Symmetrical without gross deformities. Neurologic:  Alert and coherent;  grossly normal neurologically. Skin:  Intact without  significant lesions or rashes. Cervical Nodes:  No significant cervical adenopathy. Psych:   Alert and cooperative. Normal mood and affect.  Intake/Output from previous day:   Intake/Output this shift:    Lab Results:  Recent Labs  01/03/13 2308 01/03/13 2347 01/06/13 1358  WBC 7.8  --  10.1  HGB 12.5 11.6* 13.9  HCT 34.5* 34.0* 37.6  PLT 231  --  315   BMET  Recent Labs  01/03/13 2347 01/06/13 1358  NA 133* 132*  K 3.4* 4.2  CL 104 99  CO2  --  18*  GLUCOSE 90 90  BUN 6 23  CREATININE 0.80 2.23*  CALCIUM  --  9.2   LFT  Recent Labs  01/06/13 1358  PROT 6.4  ALBUMIN 3.5  AST 20  ALT 9  ALKPHOS 50  BILITOT 0.4   PT/INR No results found for this basename: LABPROT, INR,  in the last 72 hours  C-Diff Negative when checked 5 days ago  Studies/Results: US Abdomen Complete  01/06/2013  *RADIOLOGY REPORT*  Clinical Data:  Abdominal pain.  ABDOMINAL ULTRASOUND COMPLETE  Comparison:  None.  Findings:  Gallbladder:  No gallstones, gallbladder wall thickening, or pericholecystic fluid.  Common Bile Duct:  Within normal limits in caliber. Measures 2 mm in diameter.  Liver: Multiple hepatic cysts are seen measuring up to 4 cm in size.  No solid liver masses are identified.  Within normal limits  in parenchymal echogenicity.  IVC:  Appears normal.  Pancreas:  No abnormality identified.  Spleen:  Within normal limits in size and echotexture.  Right kidney:  Normal in size and parenchymal echogenicity.  No evidence of mass or hydronephrosis.  Left kidney:  Normal in size and parenchymal echogenicity.  No evidence of mass or hydronephrosis.  Abdominal Aorta:  No aneurysm identified.  Calcified atherosclerotic plaque noted.  IMPRESSION:  1.  No evidence of gallstones, hydronephrosis, or other acute findings. 2.  Multiple hepatic cysts.  No solid liver mass identified. 3.  Abdominal aortic plaque, without evidence of aneurysm.   Original Report Authenticated By: Myles Rosenthal, M.D.     Impression: 1. Nonspecific illness, with upper tract symptoms (nausea, previous vomiting, loss of appetite) and lower tract symptoms (diarrhea) initially thought to be due to a community acquired viral gastroenteritis, but with persistence of symptoms too long for that to be a likely diagnosis.  2. Heme positive stool as outpatient.  3. History of collagenous colitis diagnosed a year ago, off treatment and doing well in recent months  4. Significant rise in creatinine over the past 3 days, presumably prerenal due to dehydration  Plan: Endoscopy and colonoscopy tomorrow. The patient is familiar with the procedures and agreeable to proceed. We will be obtaining random mucosal biopsies to look for upper or lower tract sources of diarrhea, including giardiasis, celiac disease, eosinophilic gastroenteritis, amyloidosis, recurrent collagenous colitis, or inflammatory bowel disease.   LOS: 0 days   Abe Schools V  01/06/2013, 8:20 PM

## 2013-01-06 NOTE — Telephone Encounter (Signed)
I have received a call today about Bailey [name] from [facility] (Dr. Laurey Morale office). The patient has been feeling poorly for quite some time now. She recently had an ED visit on 01/01/2013 with nausea vomiting diarrhea dehydration and feeling weak. At that time, she had diarrhea for 2 weeks, given flagyl and zofran by dr Waynard Edwards.she was discharged from the ED, and 2 days later on 01/03/2013 she came in again because her symptoms do not resolve. She received IV fluids and was discharged home. Lab workup at that time showed mild hyponatremia with a sodium of 133, mild hypokalemia with a potassium of 3.4. Over the last few days patient has continued to feel weak with ongoing watery diarrhea, she complains that she can't keep anything down and has severe nausea and vomiting. She feels very weak and unable to have any by mouth intake. Per nurses report at the [facility], she denied lightheadedness or dizziness. There is no report on whether she has a fever or not.   She has a history of chronic diarrhea, followed in the past by gastroenterology, Dr. Matthias Hughs; and she had an upper endoscopy in April of 2013, which showed histologically unremarkable small bowel mucosa, no changes of sprue, active inflammation or granulomas. She also had a colonoscopy at that time which showed findings consistent with microscopic colitis. She also has a history of bicuspid aortic valve with moderate aortic insufficiency followed by Dr.Nasher. Dr. Matthias Hughs is aware that patient is having worsening of her symptoms and will be admitted.

## 2013-01-07 ENCOUNTER — Encounter (HOSPITAL_COMMUNITY): Payer: Self-pay

## 2013-01-07 ENCOUNTER — Encounter (HOSPITAL_COMMUNITY)
Admission: AD | Disposition: A | Discharge status: Home / Self Care | Payer: Self-pay | Source: Ambulatory Visit | Attending: Internal Medicine

## 2013-01-07 DIAGNOSIS — R197 Diarrhea, unspecified: Secondary | ICD-10-CM

## 2013-01-07 HISTORY — PX: COLONOSCOPY: SHX5424

## 2013-01-07 HISTORY — PX: ESOPHAGOGASTRODUODENOSCOPY: SHX5428

## 2013-01-07 LAB — BASIC METABOLIC PANEL
BUN: 28 mg/dL — ABNORMAL HIGH (ref 6–23)
CO2: 13 mEq/L — ABNORMAL LOW (ref 19–32)
Chloride: 107 mEq/L (ref 96–112)
Creatinine, Ser: 1.64 mg/dL — ABNORMAL HIGH (ref 0.50–1.10)
Glucose, Bld: 79 mg/dL (ref 70–99)

## 2013-01-07 LAB — TSH: TSH: 0.859 u[IU]/mL (ref 0.350–4.500)

## 2013-01-07 SURGERY — COLONOSCOPY
Anesthesia: Moderate Sedation

## 2013-01-07 MED ORDER — MIDAZOLAM HCL 5 MG/5ML IJ SOLN
INTRAMUSCULAR | Status: DC | PRN
  Administered 2013-01-07 (×3): 1 mg via INTRAVENOUS
  Administered 2013-01-07: 2 mg via INTRAVENOUS
  Administered 2013-01-07: 1 mg via INTRAVENOUS

## 2013-01-07 MED ORDER — FENTANYL CITRATE 0.05 MG/ML IJ SOLN
INTRAMUSCULAR | Status: DC | PRN
  Administered 2013-01-07 (×3): 25 ug via INTRAVENOUS

## 2013-01-07 MED ORDER — BUTAMBEN-TETRACAINE-BENZOCAINE 2-2-14 % EX AERO
INHALATION_SPRAY | CUTANEOUS | Status: DC | PRN
  Administered 2013-01-07: 2 via TOPICAL

## 2013-01-07 MED ORDER — SODIUM BICARBONATE 8.4 % IV SOLN
INTRAVENOUS | Status: DC
  Administered 2013-01-07: 09:00:00 via INTRAVENOUS
  Filled 2013-01-07 (×5): qty 1000

## 2013-01-07 MED ORDER — DIPHENHYDRAMINE HCL 50 MG/ML IJ SOLN
INTRAMUSCULAR | Status: DC | PRN
  Administered 2013-01-07: 50 mg via INTRAVENOUS

## 2013-01-07 MED ORDER — DIPHENHYDRAMINE HCL 50 MG/ML IJ SOLN
INTRAMUSCULAR | Status: AC
  Filled 2013-01-07: qty 1

## 2013-01-07 MED ORDER — SODIUM CHLORIDE 0.9 % IV SOLN
INTRAVENOUS | Status: DC
  Administered 2013-01-07 (×2): via INTRAVENOUS

## 2013-01-07 MED ORDER — BUDESONIDE 3 MG PO CP24
9.0000 mg | ORAL_CAPSULE | Freq: Every day | ORAL | Status: DC
  Administered 2013-01-07 – 2013-01-09 (×3): 9 mg via ORAL
  Filled 2013-01-07 (×3): qty 3

## 2013-01-07 MED ORDER — FENTANYL CITRATE 0.05 MG/ML IJ SOLN
INTRAMUSCULAR | Status: AC
  Filled 2013-01-07: qty 4

## 2013-01-07 MED ORDER — MIDAZOLAM HCL 5 MG/ML IJ SOLN
INTRAMUSCULAR | Status: AC
  Filled 2013-01-07: qty 2

## 2013-01-07 MED ORDER — LOPERAMIDE HCL 2 MG PO CAPS
4.0000 mg | ORAL_CAPSULE | Freq: Four times a day (QID) | ORAL | Status: DC
  Administered 2013-01-07 – 2013-01-09 (×6): 4 mg via ORAL
  Filled 2013-01-07 (×10): qty 2

## 2013-01-07 NOTE — Progress Notes (Signed)
PATIENT DETAILS Name: Bailey [name] Age: 72 y.o. Sex: female Date of Birth: April 07, 1941 Admit Date: 01/06/2013 Admitting Physician Pamella Pert, MD GNF:[id number],MVHQ A, MD  Subjective: Still has a lot of diarrhea-no abdominal pain  Assessment/Plan: Active Problems: Diarrhea with N/V and abdominal pain - C Diff PCR neg -await further stool studies-GI pathogen panel -h/o collagenous colitis-getting unpreped colonoscopy and EGD today -No further abdominal pain  ARF -clearly pre-renal -better with IVF  Met Acidosis -Anion Gap-but suspect this to be from a combination of ARF and Diarrhea -belly is soft and non tender-doubt any significant intra-abdominal pathology -getting EGD and colonoscopy today -start HCO3 infusion  Dehydration -2/2 to diarrhea -better with IVF  Hyponatremia -mild -likely 2/2 dehydration -monitor for now  HTN -BP controlled without the need for anti-hypertensive meds  Dyslipidemia -resume statins when clinically improved  Hypokalemia -2/2 GI loss-resolved -monitor periodically  Bright red Blood per rectum -Hb stable -for colonoscopy today -follow clinically  Disposition: Remain inpatient  DVT Prophylaxis: SCD's  Code Status: Full code  Procedures:  EGD and colonoscopy today  CONSULTS:  GI  PHYSICAL EXAM: Vital signs in last 24 hours: Filed Vitals:   01/06/13 1209 01/06/13 2137 01/07/13 0435  BP: 102/67 90/54 100/63  Pulse: 79 89 71  Temp: 96.8 F (36 C) 97.7 F (36.5 C) 97.4 F (36.3 C)  TempSrc: Axillary Oral Oral  Resp: 18 19 18   Height: 5\' 3"  (1.6 m)    Weight: 46.448 kg (102 lb 6.4 oz)    SpO2: 99% 99% 99%    Weight change:  Body mass index is 18.14 kg/(m^2).   Gen Exam: Awake and alert with clear speech.   Neck: Supple, No JVD.   Chest: B/L Clear.   CVS: S1 S2 Regular, no murmurs.  Abdomen: soft, BS +, non tender, non distended.  Extremities: no edema, lower extremities warm to touch. Neurologic: Non  Focal.   Skin: No Rash.   Wounds: N/A.   Intake/Output from previous day:  Intake/Output Summary (Last 24 hours) at 01/07/13 0959 Last data filed at 01/07/13 0400  Gross per 24 hour  Intake    300 ml  Output    300 ml  Net      0 ml     LAB RESULTS: CBC  Recent Labs Lab 01/01/13 1742 01/01/13 1850 01/03/13 2308 01/03/13 2347 01/06/13 1358  WBC  --  8.8 7.8  --  10.1  HGB 12.6 13.0 12.5 11.6* 13.9  HCT 37.0 35.7* 34.5* 34.0* 37.6  PLT  --  247 231  --  315  MCV  --  84.0 85.0  --  82.5  MCH  --  30.6 30.8  --  30.5  MCHC  --  36.4* 36.2*  --  37.0*  RDW  --  12.6 13.3  --  13.5  LYMPHSABS  --  1.4 2.3  --  2.2  MONOABS  --  0.6 0.9  --  1.1*  EOSABS  --  0.1 0.1  --  0.1  BASOSABS  --  0.0 0.0  --  0.0    Chemistries   Recent Labs Lab 01/01/13 1742 01/01/13 1850 01/03/13 2347 01/06/13 1358 01/07/13 0555  NA 127* 129* 133* 132* 134*  K 3.7 4.0 3.4* 4.2 4.3  CL 92* 91* 104 99 107  CO2  --  23  --  18* 13*  GLUCOSE 103* 99 90 90 79  BUN 8 9 6 23  28*  CREATININE 1.00 0.80 0.80  2.23* 1.64*  CALCIUM  --  8.8  --  9.2 8.4    CBG: No results found for this basename: GLUCAP,  in the last 168 hours  GFR Estimated Creatinine Clearance: 23 ml/min (by C-G formula based on Cr of 1.64).  Coagulation profile No results found for this basename: INR, PROTIME,  in the last 168 hours  Cardiac Enzymes No results found for this basename: CK, CKMB, TROPONINI, MYOGLOBIN,  in the last 168 hours  No components found with this basename: POCBNP,  No results found for this basename: DDIMER,  in the last 72 hours No results found for this basename: HGBA1C,  in the last 72 hours No results found for this basename: CHOL, HDL, LDLCALC, TRIG, CHOLHDL, LDLDIRECT,  in the last 72 hours  Recent Labs  01/06/13 1358  TSH 0.859   No results found for this basename: VITAMINB12, FOLATE, FERRITIN, TIBC, IRON, RETICCTPCT,  in the last 72 hours  Recent Labs  01/06/13 1358   LIPASE 180*    Urine Studies No results found for this basename: UACOL, UAPR, USPG, UPH, UTP, UGL, UKET, UBIL, UHGB, UNIT, UROB, ULEU, UEPI, UWBC, URBC, UBAC, CAST, CRYS, UCOM, BILUA,  in the last 72 hours  MICROBIOLOGY: Recent Results (from the past 240 hour(s))  CLOSTRIDIUM DIFFICILE BY PCR     Status: None   Collection Time    01/03/13 11:43 PM      Result Value Range Status   C difficile by pcr NEGATIVE  NEGATIVE Final    RADIOLOGY STUDIES/RESULTS: US Abdomen Complete  01/06/2013  *RADIOLOGY REPORT*  Clinical Data:  Abdominal pain.  ABDOMINAL ULTRASOUND COMPLETE  Comparison:  None.  Findings:  Gallbladder:  No gallstones, gallbladder wall thickening, or pericholecystic fluid.  Common Bile Duct:  Within normal limits in caliber. Measures 2 mm in diameter.  Liver: Multiple hepatic cysts are seen measuring up to 4 cm in size.  No solid liver masses are identified.  Within normal limits in parenchymal echogenicity.  IVC:  Appears normal.  Pancreas:  No abnormality identified.  Spleen:  Within normal limits in size and echotexture.  Right kidney:  Normal in size and parenchymal echogenicity.  No evidence of mass or hydronephrosis.  Left kidney:  Normal in size and parenchymal echogenicity.  No evidence of mass or hydronephrosis.  Abdominal Aorta:  No aneurysm identified.  Calcified atherosclerotic plaque noted.  IMPRESSION:  1.  No evidence of gallstones, hydronephrosis, or other acute findings. 2.  Multiple hepatic cysts.  No solid liver mass identified. 3.  Abdominal aortic plaque, without evidence of aneurysm.   Original Report Authenticated By: Myles Rosenthal, M.D.     MEDICATIONS: Scheduled Meds:  Continuous Infusions: . sodium chloride    . dextrose 5 % 1,000 mL with potassium chloride 40 mEq, sodium bicarbonate 100 mEq infusion 75 mL/hr at 01/07/13 0843   PRN Meds:.acetaminophen, acetaminophen, alum & mag hydroxide-simeth, promethazine  Antibiotics: Anti-infectives   None        Jeoffrey Massed, MD  [facility] Pager:[phone]  If 7PM-7AM, please contact night-coverage [email or web address] Password [email or web address] 01/07/2013, 9:59 AM   LOS: 1 day

## 2013-01-07 NOTE — Progress Notes (Signed)
Patient's endoscopy and colonoscopy were unrevealing for any source of diarrhea. Biopsies are pending.  In the meantime, I have ordered budesonide for treatment of her collagenous colitis, in the event that that condition has reactivated.  I have also ordered a standing schedule of Imodium to be taken around-the-clock.  In a couple of days, we will have her biopsies back. If they are unrevealing and the diarrhea persists, I anticipate ordering an abdominal pelvic CT scan to look for evidence of secretory tumors. By then, the patient's prerenal azotemia should have had a chance to completely resolved and she could safely receive IV contrast during the test.  Please feel free to call me if you have any questions.  Florencia Reasons, M.D. [phone]

## 2013-01-07 NOTE — Evaluation (Signed)
Physical Therapy Evaluation Patient Details Name: Bailey [name] MRN: [id number] DOB: August 24, 1941 Today's Date: 01/07/2013 Time: 4098-1191 PT Time Calculation (min): 14 min  PT Assessment / Plan / Recommendation Clinical Impression  Pt admitted with diarrhea x 3wks and currently functioning at baseline mobility. Pt reports fatigue from baseline due to chronic diarrhea but no deficits in being able to care for herself at this time. Pt encouraged to ambulate 3x/day during stay to maintain strength but agreeable to no further therapy needs at this time. Will sign off.     PT Assessment  Patent does not need any further PT services    Follow Up Recommendations  No PT follow up    Does the patient have the potential to tolerate intense rehabilitation      Barriers to Discharge        Equipment Recommendations  None recommended by PT    Recommendations for Other Services     Frequency      Precautions / Restrictions Precautions Precautions: None   Pertinent Vitals/Pain No pain      Mobility  Bed Mobility Bed Mobility: Supine to Sit Supine to Sit: 7: Independent Transfers Transfers: Sit to Stand;Stand to Sit Sit to Stand: 7: Independent;From bed Stand to Sit: 7: Independent;To chair/3-in-1 Ambulation/Gait Ambulation/Gait Assistance: 7: Independent Ambulation Distance (Feet): 400 Feet Assistive device: None Gait Pattern: Within Functional Limits Gait velocity: WFL Stairs: Yes Stairs Assistance: 7: Independent Stair Management Technique: No rails Number of Stairs: 1    Exercises     PT Diagnosis:    PT Problem List:   PT Treatment Interventions:     PT Goals    Visit Information  Last PT Received On: 01/07/13 Assistance Needed: +1    Subjective Data  Subjective: I've just been a little weak Patient Stated Goal: be able to go home and eat   Prior Functioning  Home Living Lives With: Spouse Available Help at Discharge: Family;Available 24 hours/day Type  of Home: House Home Access: Stairs to enter [employer] of Steps: 1 Home Layout: Two level;Able to live on main level with bedroom/bathroom Bathroom Shower/Tub: Walk-in shower;Door Dentist: None Prior Function Level of Independence: Independent Able to Take Stairs?: Yes Driving: Yes Vocation: Retired Comments: pt typically goes to her mother's in [place] every other week to help with laundry and housework Communication Communication: No difficulties    Copywriter, advertising Overall Cognitive Status: Appears within functional limits for tasks assessed/performed Arousal/Alertness: Awake/alert Orientation Level: Appears intact for tasks assessed Behavior During Session: [facility] for tasks performed    Extremity/Trunk Assessment Right Upper Extremity Assessment RUE ROM/Strength/Tone: Within functional levels Left Upper Extremity Assessment LUE ROM/Strength/Tone: Within functional levels Right Lower Extremity Assessment RLE ROM/Strength/Tone: Within functional levels Left Lower Extremity Assessment LLE ROM/Strength/Tone: Within functional levels Trunk Assessment Trunk Assessment: Normal   Balance    End of Session PT - End of Session Activity Tolerance: Patient tolerated treatment well Patient left: in chair;with call bell/phone within reach;with family/visitor present Nurse Communication: Mobility status  GP     Delorse Lek 01/07/2013, 8:17 AM Delaney Meigs, PT [phone]

## 2013-01-07 NOTE — Op Note (Signed)
[facility] [address] [place], [address]   ENDOSCOPY PROCEDURE REPORT  PATIENT: Bailey [name], Bailey [name]  MR#: [id number] BIRTHDATE: 11-03-1940 , 72  yrs. old GENDER: Female ENDOSCOPIST:Dewey Neukam, MD REFERRED BY:  Dr. Rodrigo Ran PROCEDURE DATE:  01/07/2013 PROCEDURE:      Upper endoscopy with biopsies ASA CLASS: INDICATIONS:   one-month history of intermittent nausea, weight loss, severe diarrhea, and heme positivity MEDICATION:    Benadryl 50 mg, fentanyl 75 mcg, Versed 6 mg IV (including colonoscopy meds) TOPICAL ANESTHETIC:   Cetacaine spray  DESCRIPTION OF PROCEDURE:   the patient was brought from her hospital room to the [facility] cone endoscopy unit and given the above sedation. She remained clinically stable throughout the procedure.  The Pentax video endoscope was passed under direct vision. The vocal cords were not well seen.  The esophagus was entirely normal. There was no evidence of reflux esophagitis, Barrett's esophagus, varices, infection, neoplasia, or any ring, stricture, or hiatal hernia.  The stomach was entered. It contained no residual and had entirely normal mucosa, without evidence of bile reflux, mucosal erythema, erosions, ulcers, polyps, masses, or vascular ectasia. A retroflexed view of the cardia of the stomach was normal.  The pylorus, duodenal bulb, and second duodenum looked entirely normal.  Random mucosal biopsies were obtained from the duodenum and the antrum prior to removal of the scope.  The patient tolerated the procedure well.     COMPLICATIONS: None  ENDOSCOPIC IMPRESSION:  1. Normal exam. 2. No source of heme positivity, nausea, vomiting, or diarrhea endoscopically evident.  RECOMMENDATIONS:  1. Await pathology results. 2. Proceed to colonoscopy.    _______________________________ eSignedBernette Redbird, MD 01/07/2013 3:26 PM

## 2013-01-07 NOTE — Progress Notes (Signed)
INITIAL NUTRITION ASSESSMENT  DOCUMENTATION CODES Per approved criteria  -Severe malnutrition in the context of acute illness or injury -Underweight   INTERVENTION: 1. Resource Breeze po TID, each supplement provides 250 kcal and 9 grams of protein. Once diet is advanced post procedure   2. RD will continue to follow   NUTRITION DIAGNOSIS: Inadequate oral intake related to poor appetite as evidenced by weight loss.   Goal: Po intake to meet >/=90% estimated nutrition needs.   Monitor:  PO intake, weight trends, labs, i/o's  Reason for Assessment: Malnutrition Screening Tool  72 y.o. female  Admitting Dx: diarrhea  ASSESSMENT: Pt with hx of collagenous colitis presented with several weeks ongoing diarrhea. Initially thought to be a viral gastritis, but has failed to improve with out patient management. Pt also with dehydration and poor oral intake. Planned for Endoscopy and colonoscopy today.  RD pulled to pt for malnutrition screening tool report, pt indicated 14 lb weight loss recently.   Pt reports that for about 3-4 weeks she has had no appetite, food did not taste right. Was able to sip a little Gatorade and eat a few potato chips. Pt with 9 lb weight loss in about 2 months per weight hx.   Pt meets criteria for severe malnutrition in the context of acute illness 2/2 weight loss of 8% body weight in 2 months and meeting <50% estimated nutrition needs for > 5 days.   Height: Ht Readings from Last 1 Encounters:  01/06/13 5\' 3"  (1.6 m)    Weight: Wt Readings from Last 1 Encounters:  01/06/13 102 lb 6.4 oz (46.448 kg)    Ideal Body Weight: 115 lbs   % Ideal Body Weight: 89%  Wt Readings from Last 10 Encounters:  01/06/13 102 lb 6.4 oz (46.448 kg)  01/06/13 102 lb 6.4 oz (46.448 kg)  03/14/12 111 lb 15.9 oz (50.8 kg)  02/27/12 112 lb 1.9 oz (50.857 kg)    Usual Body Weight: 112 lbs   % Usual Body Weight: 91%  BMI:  Body mass index is 18.14 kg/(m^2).  Underweight   Estimated Nutritional Needs: Kcal: 1350-1550 Protein: 45-55 gm Fluid: >/= 1.5 L  Skin: intact   Diet Order: NPO  EDUCATION NEEDS: -No education needs identified at this time   Intake/Output Summary (Last 24 hours) at 01/07/13 0912 Last data filed at 01/07/13 0400  Gross per 24 hour  Intake    300 ml  Output    300 ml  Net      0 ml    Last BM: PTA   Labs:   Recent Labs Lab 01/01/13 1850 01/03/13 2347 01/06/13 1358 01/07/13 0555  NA 129* 133* 132* 134*  K 4.0 3.4* 4.2 4.3  CL 91* 104 99 107  CO2 23  --  18* 13*  BUN 9 6 23  28*  CREATININE 0.80 0.80 2.23* 1.64*  CALCIUM 8.8  --  9.2 8.4  GLUCOSE 99 90 90 79    CBG (last 3)  No results found for this basename: GLUCAP,  in the last 72 hours  Scheduled Meds:   Continuous Infusions: . sodium chloride    . dextrose 5 % 1,000 mL with potassium chloride 40 mEq, sodium bicarbonate 100 mEq infusion 75 mL/hr at 01/07/13 9147    Past Medical History  Diagnosis Date  . Hypertension   . Hyperlipidemia   . Aortic insufficiency   . Vertigo   . Bicuspid aortic valve   . Colitis, collagenous summer of  2013  . Pneumonia ~ 1944; ~ 2009  . GERD (gastroesophageal reflux disease)     "/endoscopy; doesn't bother me at all" (01/06/2013)  . Migraine     "used to have once/month; none in years" (01/06/2013)  . Arthritis     "hands" (01/06/2013)    Past Surgical History  Procedure Laterality Date  . Tonsillectomy      "when I was little" (01/06/2013)  . Tubal ligation Bilateral 1970's  . Tumor removal  1990's & 06/23/2012    "benign; roof of mouth" (01/06/2013)  . Thyroidectomy, partial      "removed gland from left side of neck that was to go down and past thyroid in front" (01/06/2013)    Clarene Duke RD, LDN Pager [phone] After Hours pager [phone]

## 2013-01-07 NOTE — Op Note (Signed)
[facility] [address] [place], [address]   COLONOSCOPY PROCEDURE REPORT  PATIENT: Bailey [name], Bailey [name]  MR#: [id number] BIRTHDATE: 1941/07/26 , 72  yrs. old GENDER: Female ENDOSCOPIST: Bernette Redbird, MD REFERRED BY:   Dr. Rodrigo Ran PROCEDURE DATE:  01/07/2013 PROCEDURE:     colonoscopy with biopsies ASA CLASS: INDICATIONS:  one-month history of severe diarrhea, moderate weight loss. Also, past history of collagenous colitis (clinically quiescent for the preceding 6 months), and in history of heme positive stool in the office MEDICATIONS:    Benadryl 50 mg, fentanyl 75 mcg, and Versed 6 mg IV  DESCRIPTION OF PROCEDURE: this procedure was performed immediately following the patient's upper endoscopy. She had been brought from her hospital room to the [facility] cone endoscopy unit and received the above sedation, with good tolerance and no evidence of clinical instability.  Perianal exam was unremarkable. The Pentax pediatric video colonoscope was advanced around the colon, experiencing quite a bit of angulation, tortuosity, and looping. With the patient in the supine position and with external abdominal compression, advancement was easier but this was still somewhat difficult exam.  This procedure was done unprepped, yet the prep was as good or better than patients who have had a gallon of Nu-lytely. In other words, the emptiness of the colon correlates with her severe diarrhea.  The cecum was identified by visualization of the appendiceal orifice, and the terminal ileum was entered and appeared normal. Random mucosal biopsies were obtained of the terminal ileum, and then pullback was performed.  There was moderate sigmoid diverticulosis, but this was otherwise a normal exam. No polyps, masses, or vascular ectasia were noted. Specifically, there was no endoscopic evidence of colitis, such as erythema or mucosal edema, granularity or exudate.  No pseudomembranes were seen.  Retroflexion in the rectum and reinspection of the distal rectum were unremarkable.  The patient tolerated the procedure quite well.       COMPLICATIONS: None  ENDOSCOPIC IMPRESSION:  1. Mild to moderate sigmoid diverticulosis. 2. Otherwise normal examination to the terminal ileum, without source of diarrhea or heme positivity endoscopically evident.  RECOMMENDATIONS:  1. Await pathology results.    _______________________________ eSignedBernette Redbird, MD 01/07/2013 3:49 PM     PATIENT NAME:  Bailey [name], Bailey [name] MR#: [id number]

## 2013-01-08 ENCOUNTER — Encounter (HOSPITAL_COMMUNITY): Payer: Self-pay | Admitting: Gastroenterology

## 2013-01-08 DIAGNOSIS — N179 Acute kidney failure, unspecified: Secondary | ICD-10-CM

## 2013-01-08 LAB — CBC
HCT: 31 % — ABNORMAL LOW (ref 36.0–46.0)
Hemoglobin: 11.2 g/dL — ABNORMAL LOW (ref 12.0–15.0)
MCH: 30.4 pg (ref 26.0–34.0)
MCHC: 36.1 g/dL — ABNORMAL HIGH (ref 30.0–36.0)
RBC: 3.68 MIL/uL — ABNORMAL LOW (ref 3.87–5.11)

## 2013-01-08 LAB — GI PATHOGEN PANEL BY PCR, STOOL
Cryptosporidium by PCR: NEGATIVE
E coli 0157 by PCR: NEGATIVE
G lamblia by PCR: NEGATIVE
Norovirus GI/GII: NEGATIVE
Rotavirus A by PCR: NEGATIVE
Salmonella by PCR: NEGATIVE
Shigella by PCR: NEGATIVE

## 2013-01-08 LAB — COMPREHENSIVE METABOLIC PANEL
ALT: 7 U/L (ref 0–35)
Alkaline Phosphatase: 45 U/L (ref 39–117)
BUN: 22 mg/dL (ref 6–23)
CO2: 17 mEq/L — ABNORMAL LOW (ref 19–32)
Calcium: 8.3 mg/dL — ABNORMAL LOW (ref 8.4–10.5)
GFR calc Af Amer: 52 mL/min — ABNORMAL LOW (ref >90)
GFR calc non Af Amer: 45 mL/min — ABNORMAL LOW (ref >90)
Glucose, Bld: 92 mg/dL (ref 70–99)
Potassium: 3.7 mEq/L (ref 3.5–5.1)
Total Protein: 5.2 g/dL — ABNORMAL LOW (ref 6.0–8.3)

## 2013-01-08 LAB — URINE CULTURE

## 2013-01-08 MED ORDER — BOOST / RESOURCE BREEZE PO LIQD
1.0000 | Freq: Three times a day (TID) | ORAL | Status: DC
  Administered 2013-01-08 – 2013-01-09 (×3): 1 via ORAL

## 2013-01-08 MED ORDER — BISMUTH SUBSALICYLATE 262 MG PO CHEW
786.0000 mg | CHEWABLE_TABLET | Freq: Three times a day (TID) | ORAL | Status: DC
  Administered 2013-01-08 – 2013-01-09 (×2): 786 mg via ORAL
  Filled 2013-01-08 (×5): qty 3

## 2013-01-08 NOTE — Progress Notes (Signed)
GASTROENTEROLOGY PROGRESS NOTE  Problem:   Severe diarrhea and dehydration. Nausea and vomiting. Weight loss.  Subjective: Somewhat better today. Diarrhea is somewhat improved.  Patient is now on budesonide.  Objective:  Biopsy results are back and, interestingly, showed collagenous and lymphocytic inflammatory changes of all areas tested, including the stomach, duodenum, terminal ileum, and colon. This is in contrast to a year ago, when her duodenal biopsies showed no evidence of collagenous inflammation.  Tissue transglutaminase levels are normal, going against coexistent celiac disease.  The patient's creatinine is almost down to normal. She is no longer on IV fluids.  Assessment:  Extension of patient's inflammatory condition from the colon into the more proximal sections of the GI tract, which probably accounts for her nausea and vomiting, as well as her diarrhea.  Plan: 1. Continue budesonide and loperamide 2. Add Pepto-Bismol 3. Fluid restriction diet, since collagenous duodenitis can be a manifestation of celiac disease, although her antibody testing was negative for that disease 4. Extensive, detailed discussion with patient and husband regarding her biopsy findings, pathogenesis (unknown), prognosis, and management options including financial considerations for the medications she is on. Long-term, my hope is to be able to transition her from budesonide and Pepto-Bismol, which could have long-term toxicity, to mesalamine.  Bailey [name], M.D. 01/08/2013 5:26 PM

## 2013-01-08 NOTE — Progress Notes (Signed)
Patient evaluated for long-term disease management services with [facility] as a benefit of her [employer]. Explained services at bedside. Bailey [name] does not feel like she needs [facility] at this time. Left contact information and brochure at bedside for her to call if she should need [place] in the future. Hal Morales, [facility], [phone]

## 2013-01-08 NOTE — Progress Notes (Signed)
PATIENT DETAILS Name: Bailey [name] Age: 72 y.o. Sex: female Date of Birth: [birth year] Admit Date: 01/06/2013 Admitting Physician Pamella Pert, MD WUJ:WJXBJY,NWGN A, MD  Subjective: Diarrhea seems to be slowing down.  Assessment/Plan: Active Problems: Diarrhea with N/V and abdominal pain -current suspicion is for flare of underlying known collagenous colitis - C Diff PCR neg -await further stool studies-GI pathogen panel -h/o collagenous colitis-s/p colonoscopy and EGD on 3/18-await bx  -appreciate GI input  ARF -clearly pre-renal -better with IVF-continue with judicous hydration  Met Acidosis -Anion Gap-but suspect this to be from a combination of ARF and Diarrhea -belly is soft and non tender-doubt any significant intra-abdominal pathology -c/w HCO3 infusion  Dehydration -2/2 to diarrhea -better with IVF  Hyponatremia -mild -likely 2/2 dehydration -monitor for now  HTN -BP controlled without the need for anti-hypertensive meds  Dyslipidemia -resume statins when clinically improved  Hypokalemia -2/2 GI loss-resolved -monitor periodically  Bright red Blood per rectum -Drop in Hb level-likely 2/2 to IVF -EGD/Colonoscopy neg for bleeding etiology -follow clinically  Severe Malnutrtion/Underweight -start Supplements -this is in a setting of chronic illness/diarrhea  Disposition: Remain inpatient  DVT Prophylaxis: SCD's  Code Status: Full code  Procedures:  EGD and colonoscopy today  CONSULTS:  GI  PHYSICAL EXAM: Vital signs in last 24 hours: Filed Vitals:   01/07/13 1550 01/07/13 1655 01/07/13 2105 01/08/13 0635  BP: 160/76 136/82 114/74 99/62  Pulse:  63 77 82  Temp:  97.4 F (36.3 C)  98.5 F (36.9 C)  TempSrc:  Oral Oral Oral  Resp: 15 20 18 20   Height:      Weight:      SpO2: 97% 98% 98% 95%    Weight change: -0.181 kg (-6.4 oz) Body mass index is 18.07 kg/(m^2).   Gen Exam: Awake and alert with clear speech.   Neck:  Supple, No JVD.   Chest: B/L Clear.  No rales or rhonchi CVS: S1 S2 Regular, no murmurs.  Abdomen: soft, BS +, non tender, non distended.  Extremities: no edema, lower extremities warm to touch. Neurologic: Non Focal.   Skin: No Rash.   Wounds: N/A.   Intake/Output from previous day:  Intake/Output Summary (Last 24 hours) at 01/08/13 0946 Last data filed at 01/08/13 0500  Gross per 24 hour  Intake 1271.25 ml  Output    754 ml  Net 517.25 ml     LAB RESULTS: CBC  Recent Labs Lab 01/01/13 1850 01/03/13 2308 01/03/13 2347 01/06/13 1358 01/08/13 0518  WBC 8.8 7.8  --  10.1 9.7  HGB 13.0 12.5 11.6* 13.9 11.2*  HCT 35.7* 34.5* 34.0* 37.6 31.0*  PLT 247 231  --  315 284  MCV 84.0 85.0  --  82.5 84.2  MCH 30.6 30.8  --  30.5 30.4  MCHC 36.4* 36.2*  --  37.0* 36.1*  RDW 12.6 13.3  --  13.5 13.4  LYMPHSABS 1.4 2.3  --  2.2  --   MONOABS 0.6 0.9  --  1.1*  --   EOSABS 0.1 0.1  --  0.1  --   BASOSABS 0.0 0.0  --  0.0  --     Chemistries   Recent Labs Lab 01/01/13 1850 01/03/13 2347 01/06/13 1358 01/07/13 0555 01/08/13 0518  NA 129* 133* 132* 134* 134*  K 4.0 3.4* 4.2 4.3 3.7  CL 91* 104 99 107 106  CO2 23  --  18* 13* 17*  GLUCOSE 99 90 90 79 92  BUN  9 6 23  28* 22  CREATININE 0.80 0.80 2.23* 1.64* 1.19*  CALCIUM 8.8  --  9.2 8.4 8.3*    CBG: No results found for this basename: GLUCAP,  in the last 168 hours  GFR Estimated Creatinine Clearance: 31.7 ml/min (by C-G formula based on Cr of 1.19).  Coagulation profile No results found for this basename: INR, PROTIME,  in the last 168 hours  Cardiac Enzymes No results found for this basename: CK, CKMB, TROPONINI, MYOGLOBIN,  in the last 168 hours  No components found with this basename: POCBNP,  No results found for this basename: DDIMER,  in the last 72 hours No results found for this basename: HGBA1C,  in the last 72 hours No results found for this basename: CHOL, HDL, LDLCALC, TRIG, CHOLHDL, LDLDIRECT,   in the last 72 hours  Recent Labs  01/06/13 1358  TSH 0.859   No results found for this basename: VITAMINB12, FOLATE, FERRITIN, TIBC, IRON, RETICCTPCT,  in the last 72 hours  Recent Labs  01/06/13 1358  LIPASE 180*    Urine Studies No results found for this basename: UACOL, UAPR, USPG, UPH, UTP, UGL, UKET, UBIL, UHGB, UNIT, UROB, ULEU, UEPI, UWBC, URBC, UBAC, CAST, CRYS, UCOM, BILUA,  in the last 72 hours  MICROBIOLOGY: Recent Results (from the past 240 hour(s))  CLOSTRIDIUM DIFFICILE BY PCR     Status: None   Collection Time    01/03/13 11:43 PM      Result Value Range Status   C difficile by pcr NEGATIVE  NEGATIVE Final  URINE CULTURE     Status: None   Collection Time    01/06/13  5:23 PM      Result Value Range Status   Specimen Description URINE, CLEAN CATCH   Final   Special Requests NONE   Final   Culture  Setup Time 01/06/2013 18:53   Final   Colony Count 15,000 COLONIES/ML   Final   Culture GRAM NEGATIVE RODS   Final   Report Status PENDING   Incomplete  STOOL CULTURE     Status: None   Collection Time    01/06/13  5:24 PM      Result Value Range Status   Specimen Description STOOL   Final   Special Requests NONE   Final   Culture Culture reincubated for better growth   Final   Report Status PENDING   Incomplete    RADIOLOGY STUDIES/RESULTS: US Abdomen Complete  01/06/2013  *RADIOLOGY REPORT*  Clinical Data:  Abdominal pain.  ABDOMINAL ULTRASOUND COMPLETE  Comparison:  None.  Findings:  Gallbladder:  No gallstones, gallbladder wall thickening, or pericholecystic fluid.  Common Bile Duct:  Within normal limits in caliber. Measures 2 mm in diameter.  Liver: Multiple hepatic cysts are seen measuring up to 4 cm in size.  No solid liver masses are identified.  Within normal limits in parenchymal echogenicity.  IVC:  Appears normal.  Pancreas:  No abnormality identified.  Spleen:  Within normal limits in size and echotexture.  Right kidney:  Normal in size and  parenchymal echogenicity.  No evidence of mass or hydronephrosis.  Left kidney:  Normal in size and parenchymal echogenicity.  No evidence of mass or hydronephrosis.  Abdominal Aorta:  No aneurysm identified.  Calcified atherosclerotic plaque noted.  IMPRESSION:  1.  No evidence of gallstones, hydronephrosis, or other acute findings. 2.  Multiple hepatic cysts.  No solid liver mass identified. 3.  Abdominal aortic plaque, without evidence of aneurysm.  Original Report Authenticated By: Myles Rosenthal, M.D.     MEDICATIONS: Scheduled Meds: . budesonide  9 mg Oral Daily  . feeding supplement  1 Container Oral TID BM  . loperamide  4 mg Oral QID   Continuous Infusions: . dextrose 5 % 1,000 mL with potassium chloride 40 mEq, sodium bicarbonate 100 mEq infusion 75 mL/hr at 01/07/13 2113   PRN Meds:.acetaminophen, acetaminophen, promethazine  Antibiotics: Anti-infectives   None       Jeoffrey Massed, MD  [facility] Pager:[phone]  If 7PM-7AM, please contact night-coverage [email or web address] Password [facility] 01/08/2013, 9:46 AM   LOS: 2 days

## 2013-01-09 DIAGNOSIS — E876 Hypokalemia: Secondary | ICD-10-CM

## 2013-01-09 LAB — BASIC METABOLIC PANEL
CO2: 21 mEq/L (ref 19–32)
Calcium: 8.2 mg/dL — ABNORMAL LOW (ref 8.4–10.5)
Creatinine, Ser: 0.97 mg/dL (ref 0.50–1.10)
GFR calc Af Amer: 67 mL/min — ABNORMAL LOW (ref >90)
Sodium: 136 mEq/L (ref 135–145)

## 2013-01-09 MED ORDER — BISMUTH SUBSALICYLATE 262 MG PO CHEW: 786.0000 mg | CHEWABLE_TABLET | Freq: Three times a day (TID) | ORAL | Status: DC

## 2013-01-09 MED ORDER — BUDESONIDE 3 MG PO CP24: 9.0000 mg | ORAL_CAPSULE | Freq: Every day | ORAL | Status: DC

## 2013-01-09 MED ORDER — POTASSIUM CHLORIDE CRYS ER 20 MEQ PO TBCR
40.0000 meq | EXTENDED_RELEASE_TABLET | Freq: Once | ORAL | Status: AC
  Administered 2013-01-09: 40 meq via ORAL
  Filled 2013-01-09: qty 2

## 2013-01-09 MED ORDER — BOOST / RESOURCE BREEZE PO LIQD: 1.0000 | Freq: Three times a day (TID) | ORAL | Status: DC

## 2013-01-09 MED ORDER — LOPERAMIDE HCL 2 MG PO CAPS: 4.0000 mg | ORAL_CAPSULE | Freq: Four times a day (QID) | ORAL | Status: DC

## 2013-01-09 NOTE — Plan of Care (Signed)
Problem: Food- and Nutrition-Related Knowledge Deficit (NB-1.1) Goal: Nutrition education Formal process to instruct or train a patient/client in a skill or to impart knowledge to help patients/clients voluntarily manage or modify food choices and eating behavior to maintain or improve health. Outcome: Completed/Met Date Met:  01/09/13 Nutrition Education Note:   RD consulted for new Gluten Free diet education.  Pt being followed by RD for weight loss. Pt with colitis, suspected that gluten may be exacerbating symptoms.  RD provided pt with Gluten Free diet education hand out. RD explained what gluten is, and what foods contain gluten. Encouraged pt to exclude food made with wheat, rye, barley, and quinoa. Explained how to find these foods or ingredients on the food label and other source of hidden gluten. Per discussion with MD, pt does not need to eliminate all trace sources of gluten- things such as condiments are acceptable. RD answered all questions and provided contact information.   RD encouraged pt to continue to drink Resource Breeze beverages as able at home, this product is gluten free.   RD will continue to follow.    Clarene Duke RD, LDN Pager [phone] After Hours pager [phone]

## 2013-01-09 NOTE — Progress Notes (Signed)
Bailey [name] discharged Home per MD order.  Discharge instructions reviewed and discussed with the patient, all questions and concerns answered. Copy of instructions and scripts given to patient.    Medication List    STOP taking these medications       hydrochlorothiazide 25 MG tablet  Commonly known as:  HYDRODIURIL     indapamide 2.5 MG tablet  Commonly known as:  LOZOL     olmesartan 40 MG tablet  Commonly known as:  BENICAR      TAKE these medications       atorvastatin 20 MG tablet  Commonly known as:  LIPITOR  Take 10 mg by mouth daily.     BIOTIN 5000 PO  Take 5,000 mcg by mouth daily.     bismuth subsalicylate 262 MG chewable tablet  Commonly known as:  PEPTO BISMOL  Chew 3 tablets (786 mg total) by mouth 3 (three) times daily with meals.     budesonide 3 MG 24 hr capsule  Commonly known as:  ENTOCORT EC  Take 3 capsules (9 mg total) by mouth daily.     feeding supplement Liqd  Take 1 Container by mouth 3 (three) times daily between meals.     loperamide 2 MG capsule  Commonly known as:  IMODIUM  Take 2 capsules (4 mg total) by mouth 4 (four) times daily.     multivitamin with minerals Tabs  Take 1 tablet by mouth daily.     potassium chloride 10 MEQ tablet  Commonly known as:  K-DUR  Take 10 mEq by mouth daily.     PRESCRIPTION MEDICATION  Inhale 1 spray into the lungs daily as needed (nasal spray for congestion).     promethazine 25 MG tablet  Commonly known as:  PHENERGAN  Take 1 tablet (25 mg total) by mouth every 6 (six) hours as needed for nausea.     VITAMIN D-3 PO  Take 2,000 Units by mouth daily.        Patients skin is clean, dry and intact, no evidence of skin break down. IV site discontinued and catheter remains intact. Site without signs and symptoms of complications. Dressing and pressure applied.  Patient escorted to car by volunteer in a wheelchair,  no distress noted upon discharge.  Laural Benes, Brittain Smithey C 01/09/2013 12:30  PM

## 2013-01-09 NOTE — Discharge Summary (Signed)
PATIENT DETAILS Name: Bailey [name] Age: 72 y.o. Sex: female Date of Birth: 1940/11/12 MRN: [id number]. Admit Date: 01/06/2013 Admitting Physician: Pamella Pert, MD WUJ:WJXBJY,NWGN A, MD  Recommendations for Outpatient Follow-up:  1. Check Electrolytes and CBC next visit 2. Re-assess for resumption of Anti-hypertensives  PRIMARY DISCHARGE DIAGNOSIS:  Active Problems:   Diarrhea/  Nausea with vomiting- 2/2 to severe collagenous colitis   Abdominal pain, right upper quadrant   Palpitations   Bright red blood per rectum   Dehydration   Hypokalemia   Hyponatremia   Acute renal failure   Met Acidosis      PAST MEDICAL HISTORY: Past Medical History  Diagnosis Date  . Hypertension   . Hyperlipidemia   . Aortic insufficiency   . Vertigo   . Bicuspid aortic valve   . Colitis, collagenous summer of 2013  . Pneumonia ~ 1944; ~ 2009  . GERD (gastroesophageal reflux disease)     "/endoscopy; doesn't bother me at all" (01/06/2013)  . Migraine     "used to have once/month; none in years" (01/06/2013)  . Arthritis     "hands" (01/06/2013)    DISCHARGE MEDICATIONS:   Medication List    STOP taking these medications       hydrochlorothiazide 25 MG tablet  Commonly known as:  HYDRODIURIL     indapamide 2.5 MG tablet  Commonly known as:  LOZOL     olmesartan 40 MG tablet  Commonly known as:  BENICAR      TAKE these medications       atorvastatin 20 MG tablet  Commonly known as:  LIPITOR  Take 10 mg by mouth daily.     BIOTIN 5000 PO  Take 5,000 mcg by mouth daily.     bismuth subsalicylate 262 MG chewable tablet  Commonly known as:  PEPTO BISMOL  Chew 3 tablets (786 mg total) by mouth 3 (three) times daily with meals.     budesonide 3 MG 24 hr capsule  Commonly known as:  ENTOCORT EC  Take 3 capsules (9 mg total) by mouth daily.     feeding supplement Liqd  Take 1 Container by mouth 3 (three) times daily between meals.     loperamide 2 MG capsule  Commonly  known as:  IMODIUM  Take 2 capsules (4 mg total) by mouth 4 (four) times daily.     multivitamin with minerals Tabs  Take 1 tablet by mouth daily.     potassium chloride 10 MEQ tablet  Commonly known as:  K-DUR  Take 10 mEq by mouth daily.     PRESCRIPTION MEDICATION  Inhale 1 spray into the lungs daily as needed (nasal spray for congestion).     promethazine 25 MG tablet  Commonly known as:  PHENERGAN  Take 1 tablet (25 mg total) by mouth every 6 (six) hours as needed for nausea.     VITAMIN D-3 PO  Take 2,000 Units by mouth daily.         BRIEF HPI:  See H&P, Labs, Consult and Test reports for all details in brief, patient is a 73 yo female with a history of collagenous colitis, bicuspid aortic valve, vertigo, htn, hld who presented to [facility] as a direct admission-for evaluation of diarrhea and vomiting.Dr. Matthias Hughs performed Upper Endoscopy and Colonoscopy in April of 2013. The EGD was essentially negative and the Colonoscopy resulted in findings consistent with collagenous colitis.   CONSULTATIONS:   GI  PERTINENT RADIOLOGIC STUDIES: US Abdomen Complete  01/06/2013  *RADIOLOGY REPORT*  Clinical Data:  Abdominal pain.  ABDOMINAL ULTRASOUND COMPLETE  Comparison:  None.  Findings:  Gallbladder:  No gallstones, gallbladder wall thickening, or pericholecystic fluid.  Common Bile Duct:  Within normal limits in caliber. Measures 2 mm in diameter.  Liver: Multiple hepatic cysts are seen measuring up to 4 cm in size.  No solid liver masses are identified.  Within normal limits in parenchymal echogenicity.  IVC:  Appears normal.  Pancreas:  No abnormality identified.  Spleen:  Within normal limits in size and echotexture.  Right kidney:  Normal in size and parenchymal echogenicity.  No evidence of mass or hydronephrosis.  Left kidney:  Normal in size and parenchymal echogenicity.  No evidence of mass or hydronephrosis.  Abdominal Aorta:  No aneurysm identified.  Calcified  atherosclerotic plaque noted.  IMPRESSION:  1.  No evidence of gallstones, hydronephrosis, or other acute findings. 2.  Multiple hepatic cysts.  No solid liver mass identified. 3.  Abdominal aortic plaque, without evidence of aneurysm.   Original Report Authenticated By: Myles Rosenthal, M.D.      PERTINENT LAB RESULTS: CBC:  Recent Labs  01/06/13 1358 01/08/13 0518  WBC 10.1 9.7  HGB 13.9 11.2*  HCT 37.6 31.0*  PLT 315 284   CMET CMP     Component Value Date/Time   NA 136 01/09/2013 0544   K 3.0* 01/09/2013 0544   CL 105 01/09/2013 0544   CO2 21 01/09/2013 0544   GLUCOSE 99 01/09/2013 0544   BUN 16 01/09/2013 0544   CREATININE 0.97 01/09/2013 0544   CALCIUM 8.2* 01/09/2013 0544   PROT 5.2* 01/08/2013 0518   ALBUMIN 2.8* 01/08/2013 0518   AST 19 01/08/2013 0518   ALT 7 01/08/2013 0518   ALKPHOS 45 01/08/2013 0518   BILITOT 0.4 01/08/2013 0518   GFRNONAA 57* 01/09/2013 0544   GFRAA 67* 01/09/2013 0544    GFR Estimated Creatinine Clearance: 38.9 ml/min (by C-G formula based on Cr of 0.97).  Recent Labs  01/06/13 1358  LIPASE 180*   No results found for this basename: CKTOTAL, CKMB, CKMBINDEX, TROPONINI,  in the last 72 hours No components found with this basename: POCBNP,  No results found for this basename: DDIMER,  in the last 72 hours No results found for this basename: HGBA1C,  in the last 72 hours No results found for this basename: CHOL, HDL, LDLCALC, TRIG, CHOLHDL, LDLDIRECT,  in the last 72 hours  Recent Labs  01/06/13 1358  TSH 0.859   No results found for this basename: VITAMINB12, FOLATE, FERRITIN, TIBC, IRON, RETICCTPCT,  in the last 72 hours Coags: No results found for this basename: PT, INR,  in the last 72 hours Microbiology: Recent Results (from the past 240 hour(s))  CLOSTRIDIUM DIFFICILE BY PCR     Status: None   Collection Time    01/03/13 11:43 PM      Result Value Range Status   C difficile by pcr NEGATIVE  NEGATIVE Final  URINE CULTURE     Status:  None   Collection Time    01/06/13  5:23 PM      Result Value Range Status   Specimen Description URINE, CLEAN CATCH   Final   Special Requests NONE   Final   Culture  Setup Time 01/06/2013 18:53   Final   Colony Count 15,000 COLONIES/ML   Final   Culture KLEBSIELLA PNEUMONIAE   Final   Report Status 01/08/2013 FINAL   Final  Organism ID, Bacteria KLEBSIELLA PNEUMONIAE   Final  STOOL CULTURE     Status: None   Collection Time    01/06/13  5:24 PM      Result Value Range Status   Specimen Description STOOL   Final   Special Requests NONE   Final   Culture NO SUSPICIOUS COLONIES, CONTINUING TO HOLD   Final   Report Status PENDING   Incomplete     BRIEF HOSPITAL COURSE:   Active Problems:   Diarrhea along with Nausea/vomiting -patient was admittted and placed on IV Fluids. Patient has a known h/o collagenous colitis-Dr. Buccini HAD performed Upper Endoscopy and Colonoscopy in April of 2013. The EGD was essentially negative and the Colonoscopy resulted in findings consistent with collagenous colitis.  -She was given supportive care, Stool C Diff for PCR was negative-Stool GI Pathogen panel-still pending. Dr Matthias Hughs then performed a EGD and unprepped Colonoscopy and took random bx.Biopsy results showed collagenous and lymphocytic inflammatory changes of all areas tested, including the stomach, duodenum, terminal ileum, and colon.This was in contrast to a year ago, when her duodenal biopsies showed no evidence of collagenous inflammation. -Patient was then placed on Budenoside, Pepto-Bismol and Loperamide. With this her diarrhea completely stopped-she has had no bowel movement since yesterday. She was seen by Dr Jones Broom cleared the patient for discharge as well, he suggested to discharge patient on  Budenoside, Pepto-Bismol and Loperamide, he will slowly taper these agents in the outpatient setting.  ARF  -clearly pre-renal  -resolved with IVF   Met Acidosis  -Anion Gap-but suspect  this to be from a combination of ARF and Diarrhea  -belly is soft and non tender-doubt any significant intra-abdominal pathology  -resolved with with HCO3 infusion and resolution of diarrhea   Dehydration  -2/2 to diarrhea  -better with IVF   Hyponatremia  -mild  -likely 2/2 dehydration  -monitor for now   HTN  -BP controlled without the need for anti-hypertensive meds  -will likely need close monitoring of her BP as outpatient and anti-hypertensives can be restarted then   Dyslipidemia  -resume statins on discahrge   Hypokalemia  -2/2 GI loss  -K slighly low today-replete   Bright red Blood per rectum  -Drop in Hb level-likely 2/2 to IVF  -EGD/Colonoscopy neg for bleeding etiology  -follow clinically   Severe Malnutrtion/Underweight  -start Supplements  -this is in a setting of chronic illness/diarrhea   TODAY-DAY OF DISCHARGE:  Subjective:   Bailey [name] today has no headache,no chest abdominal pain,no new weakness tingling or numbness, feels much better wants to go home today.   Objective:   Blood pressure 114/65, pulse 67, temperature 98 F (36.7 C), temperature source Oral, resp. rate 20, height 5\' 3"  (1.6 m), weight 46.267 kg (102 lb), SpO2 97.00%.  Intake/Output Summary (Last 24 hours) at 01/09/13 1021 Last data filed at 01/09/13 0850  Gross per 24 hour  Intake    240 ml  Output      0 ml  Net    240 ml    Exam Awake Alert, Oriented *3, No new F.N deficits, Normal affect [place].AT,PERRAL Supple Neck,No JVD, No cervical lymphadenopathy appriciated.  Symmetrical Chest wall movement, Good air movement bilaterally, CTAB RRR,No Gallops,Rubs or new Murmurs, No Parasternal Heave +ve B.Sounds, Abd Soft, Non tender, No organomegaly appriciated, No rebound -guarding or rigidity. No Cyanosis, Clubbing or edema, No new Rash or bruise  DISCHARGE CONDITION: Stable  DISPOSITION: HOME  DISCHARGE INSTRUCTIONS:    Activity:  As tolerated   Diet  recommendation: Gluten free diet      Follow-up Information   Follow up with PERINI,MARK A, MD. Schedule an appointment as soon as possible for a visit in 1 week.   Contact information:   [address] [place] [address] [phone]       Follow up with Florencia Reasons, MD. (office will call for follow up appointment)    Contact information:   [address]                         [place] [address] [phone]       Total Time spent on discharge equals 45 minutes.  SignedJeoffrey Massed 01/09/2013 10:21 AM

## 2013-01-09 NOTE — Progress Notes (Signed)
PATIENT DETAILS Name: Bailey [name] Age: 72 y.o. Sex: female Date of Birth: Jul 11, 1941 Admit Date: 01/06/2013 Admitting Physician Pamella Pert, MD ZOX:[id number],WUJW A, MD  Subjective: Diarrhea resolved  Assessment/Plan: Active Problems: Diarrhea with N/V and abdominal pain -Bx positive for collagenous colitis in her entire bx site-including esophagus/duodenum etc - C Diff PCR neg -await further stool studies-GI pathogen panel -diarrhea seems to have resolved-spoke with Dr Jones Broom recommended continuing all three agents-Budenoside, Bismuth and Imodium. He will gradually taper as outpatient  ARF -clearly pre-renal -resolved with IVF  Met Acidosis -Anion Gap-but suspect this to be from a combination of ARF and Diarrhea -belly is soft and non tender-doubt any significant intra-abdominal pathology -resolved with with HCO3 infusion and resolution of diarrhea  Dehydration -2/2 to diarrhea -better with IVF  Hyponatremia -mild -likely 2/2 dehydration -monitor for now  HTN -BP controlled without the need for anti-hypertensive meds -will likely need close monitoring of her BP as outpatient and anti-hypertensives can be restarted then  Dyslipidemia -resume statins on discahrge  Hypokalemia -2/2 GI loss -K slighly low today-replete  Bright red Blood per rectum -Drop in Hb level-likely 2/2 to IVF -EGD/Colonoscopy neg for bleeding etiology -follow clinically  Severe Malnutrtion/Underweight -start Supplements -this is in a setting of chronic illness/diarrhea  Disposition: Remain inpatient-d/c later today  DVT Prophylaxis: SCD's  Code Status: Full code  Procedures:  EGD and colonoscopy today  CONSULTS:  GI  PHYSICAL EXAM: Vital signs in last 24 hours: Filed Vitals:   01/07/13 2105 01/08/13 0635 01/08/13 2042 01/09/13 0500  BP: 114/74 99/62 132/77 114/65  Pulse: 77 82 68 67  Temp:  98.5 F (36.9 C) 98.2 F (36.8 C) 98 F (36.7 C)  TempSrc: Oral  Oral Oral Oral  Resp: 18 20 20 20   Height:      Weight:      SpO2: 98% 95% 95% 97%    Weight change:  Body mass index is 18.07 kg/(m^2).   Gen Exam: Awake and alert with clear speech.   Neck: Supple, No JVD.   Chest: B/L Clear.  No rales or rhonchi CVS: S1 S2 Regular, no murmurs.  Abdomen: soft, BS +, non tender, non distended.  Extremities: no edema, lower extremities warm to touch. Neurologic: Non Focal.   Skin: No Rash.   Wounds: N/A.   Intake/Output from previous day:  Intake/Output Summary (Last 24 hours) at 01/09/13 0848 Last data filed at 01/08/13 2042  Gross per 24 hour  Intake    120 ml  Output      0 ml  Net    120 ml     LAB RESULTS: CBC  Recent Labs Lab 01/03/13 2308 01/03/13 2347 01/06/13 1358 01/08/13 0518  WBC 7.8  --  10.1 9.7  HGB 12.5 11.6* 13.9 11.2*  HCT 34.5* 34.0* 37.6 31.0*  PLT 231  --  315 284  MCV 85.0  --  82.5 84.2  MCH 30.8  --  30.5 30.4  MCHC 36.2*  --  37.0* 36.1*  RDW 13.3  --  13.5 13.4  LYMPHSABS 2.3  --  2.2  --   MONOABS 0.9  --  1.1*  --   EOSABS 0.1  --  0.1  --   BASOSABS 0.0  --  0.0  --     Chemistries   Recent Labs Lab 01/03/13 2347 01/06/13 1358 01/07/13 0555 01/08/13 0518 01/09/13 0544  NA 133* 132* 134* 134* 136  K 3.4* 4.2 4.3 3.7 3.0*  CL  104 99 107 106 105  CO2  --  18* 13* 17* 21  GLUCOSE 90 90 79 92 99  BUN 6 23 28* 22 16  CREATININE 0.80 2.23* 1.64* 1.19* 0.97  CALCIUM  --  9.2 8.4 8.3* 8.2*    CBG: No results found for this basename: GLUCAP,  in the last 168 hours  GFR Estimated Creatinine Clearance: 38.9 ml/min (by C-G formula based on Cr of 0.97).  Coagulation profile No results found for this basename: INR, PROTIME,  in the last 168 hours  Cardiac Enzymes No results found for this basename: CK, CKMB, TROPONINI, MYOGLOBIN,  in the last 168 hours  No components found with this basename: POCBNP,  No results found for this basename: DDIMER,  in the last 72 hours No results found  for this basename: HGBA1C,  in the last 72 hours No results found for this basename: CHOL, HDL, LDLCALC, TRIG, CHOLHDL, LDLDIRECT,  in the last 72 hours  Recent Labs  01/06/13 1358  TSH 0.859   No results found for this basename: VITAMINB12, FOLATE, FERRITIN, TIBC, IRON, RETICCTPCT,  in the last 72 hours  Recent Labs  01/06/13 1358  LIPASE 180*    Urine Studies No results found for this basename: UACOL, UAPR, USPG, UPH, UTP, UGL, UKET, UBIL, UHGB, UNIT, UROB, ULEU, UEPI, UWBC, URBC, UBAC, CAST, CRYS, UCOM, BILUA,  in the last 72 hours  MICROBIOLOGY: Recent Results (from the past 240 hour(s))  CLOSTRIDIUM DIFFICILE BY PCR     Status: None   Collection Time    01/03/13 11:43 PM      Result Value Range Status   C difficile by pcr NEGATIVE  NEGATIVE Final  URINE CULTURE     Status: None   Collection Time    01/06/13  5:23 PM      Result Value Range Status   Specimen Description URINE, CLEAN CATCH   Final   Special Requests NONE   Final   Culture  Setup Time 01/06/2013 18:53   Final   Colony Count 15,000 COLONIES/ML   Final   Culture KLEBSIELLA PNEUMONIAE   Final   Report Status 01/08/2013 FINAL   Final   Organism ID, Bacteria KLEBSIELLA PNEUMONIAE   Final  STOOL CULTURE     Status: None   Collection Time    01/06/13  5:24 PM      Result Value Range Status   Specimen Description STOOL   Final   Special Requests NONE   Final   Culture NO SUSPICIOUS COLONIES, CONTINUING TO HOLD   Final   Report Status PENDING   Incomplete    RADIOLOGY STUDIES/RESULTS: US Abdomen Complete  01/06/2013  *RADIOLOGY REPORT*  Clinical Data:  Abdominal pain.  ABDOMINAL ULTRASOUND COMPLETE  Comparison:  None.  Findings:  Gallbladder:  No gallstones, gallbladder wall thickening, or pericholecystic fluid.  Common Bile Duct:  Within normal limits in caliber. Measures 2 mm in diameter.  Liver: Multiple hepatic cysts are seen measuring up to 4 cm in size.  No solid liver masses are identified.  Within  normal limits in parenchymal echogenicity.  IVC:  Appears normal.  Pancreas:  No abnormality identified.  Spleen:  Within normal limits in size and echotexture.  Right kidney:  Normal in size and parenchymal echogenicity.  No evidence of mass or hydronephrosis.  Left kidney:  Normal in size and parenchymal echogenicity.  No evidence of mass or hydronephrosis.  Abdominal Aorta:  No aneurysm identified.  Calcified atherosclerotic plaque noted.  IMPRESSION:  1.  No evidence of gallstones, hydronephrosis, or other acute findings. 2.  Multiple hepatic cysts.  No solid liver mass identified. 3.  Abdominal aortic plaque, without evidence of aneurysm.   Original Report Authenticated By: Myles Rosenthal, M.D.     MEDICATIONS: Scheduled Meds: . bismuth subsalicylate  786 mg Oral TID WC  . budesonide  9 mg Oral Daily  . feeding supplement  1 Container Oral TID BM  . loperamide  4 mg Oral QID   Continuous Infusions:   PRN Meds:.acetaminophen, acetaminophen, promethazine  Antibiotics: Anti-infectives   None       Jeoffrey Massed, MD  [facility] Pager:[phone]  If 7PM-7AM, please contact night-coverage [email or web address] Password [facility] 01/09/2013, 8:48 AM   LOS: 3 days

## 2013-01-09 NOTE — Progress Notes (Signed)
The patient was seen prior to her discharge today and was doing terrific. She still feels a little bit weak, but she has had no bowel movements whatsoever for the past 36 hours. That correlates with use of high-dose Imodium as well as institution of budesonide. Her biopsy findings were reviewed as well as her medical therapy. She has been counseled by the dietitian with respect to gluten restriction. The patient has a followup appointment with me in the office already scheduled for approximately 2 weeks from now.  Florencia Reasons, M.D. [phone]

## 2013-01-10 LAB — STOOL CULTURE

## 2013-03-25 ENCOUNTER — Other Ambulatory Visit: Payer: Self-pay | Admitting: *Deleted

## 2013-03-25 MED ORDER — POTASSIUM CHLORIDE ER 10 MEQ PO TBCR: 10.0000 meq | EXTENDED_RELEASE_TABLET | Freq: Every day | ORAL | Status: DC

## 2013-03-25 NOTE — Telephone Encounter (Signed)
Fax Received. Refill Completed. Bailey [name] (R.M.A)   

## 2013-03-28 ENCOUNTER — Encounter: Payer: Self-pay | Admitting: Cardiovascular Disease

## 2013-03-28 ENCOUNTER — Encounter: Payer: Self-pay | Admitting: Cardiology

## 2013-04-22 ENCOUNTER — Other Ambulatory Visit: Payer: Self-pay

## 2013-04-22 DIAGNOSIS — Z1231 Encounter for screening mammogram for malignant neoplasm of breast: Secondary | ICD-10-CM

## 2013-05-15 ENCOUNTER — Ambulatory Visit: Payer: Medicare Other

## 2013-05-19 ENCOUNTER — Ambulatory Visit
Admission: RE | Admit: 2013-05-19 | Discharge: 2013-05-19 | Disposition: A | Discharge status: Home / Self Care | Payer: Medicare Other | Source: Ambulatory Visit

## 2013-05-19 DIAGNOSIS — Z1231 Encounter for screening mammogram for malignant neoplasm of breast: Secondary | ICD-10-CM

## 2013-05-26 ENCOUNTER — Other Ambulatory Visit: Payer: Self-pay

## 2013-05-26 MED ORDER — POTASSIUM CHLORIDE ER 10 MEQ PO TBCR: 10.0000 meq | EXTENDED_RELEASE_TABLET | Freq: Every day | ORAL | Status: DC

## 2013-05-28 ENCOUNTER — Telehealth: Payer: Self-pay | Admitting: Obstetrics and Gynecology

## 2013-05-28 NOTE — Telephone Encounter (Signed)
Patient called in C/O vaginal infection . Patient is in [place].  Patient will be there until Sunday.  [facility]  [phone]

## 2013-05-28 NOTE — Telephone Encounter (Signed)
Patient calling with symptoms of vaginal infection. Patient is out of town till Monday. Last OV 05/02/2012 in paper chart. Hx of vaginal atrophy. Patient states she has been on steroid medication since march 3 x day and she herself cut it back to 2 x day for colitis diagnosis of stomach and digestive tract. Patient stated will be back in town on Monday. Explained may use OTC monistat 3, aveeno sitz bath, white cotton underwear. Patient to call on Monday morning if no better and can be worked in to be checked . Any other advise to patient please advise.

## 2013-05-28 NOTE — Telephone Encounter (Signed)
No further advice.  This is fine.

## 2013-06-02 ENCOUNTER — Ambulatory Visit (INDEPENDENT_AMBULATORY_CARE_PROVIDER_SITE_OTHER): Payer: Medicare Other | Admitting: Obstetrics and Gynecology

## 2013-06-02 ENCOUNTER — Encounter: Payer: Self-pay | Admitting: Obstetrics and Gynecology

## 2013-06-02 VITALS — BP 132/80 | HR 76 | Resp 18 | Ht 62.75 in | Wt 109.0 lb

## 2013-06-02 DIAGNOSIS — N390 Urinary tract infection, site not specified: Secondary | ICD-10-CM

## 2013-06-02 DIAGNOSIS — N949 Unspecified condition associated with female genital organs and menstrual cycle: Secondary | ICD-10-CM

## 2013-06-02 LAB — POCT URINALYSIS DIPSTICK
Blood, UA: 250
Urobilinogen, UA: NEGATIVE
pH, UA: 8

## 2013-06-02 MED ORDER — FLUCONAZOLE 150 MG PO TABS: 150.0000 mg | ORAL_TABLET | Freq: Once | ORAL | Status: DC

## 2013-06-02 MED ORDER — OSPEMIFENE 60 MG PO TABS: 1.0000 | ORAL_TABLET | Freq: Every day | ORAL | Status: DC

## 2013-06-02 NOTE — Progress Notes (Signed)
72 yo MWF with recurrent vaginal dryness and burning and thought she had a yeast infection so she tried monistat OTC but it burned her so she had to quit it.  Pt is taking an oral steroid for her colitis and is thinking that's why she has a yeast infection.  Pt was going to try estring I rx'd at a previous visit for vaginal burning,  but she had so many medical expenses, she couldn't afford it.  Now, burning and irritation in vagina has been occuring for about 6 days, used Monistat 3 one time and felt better the next day, but now has recurred. Not sexually active in over a year.  Exam:  Ext atrophic but otherwise nl.  Vag -pale, absent rugae, very dry.  Cx nl.  BM:  Uterus small and mid, NT, adnexa neg.  WP :  PH = 5.5  No yeast or BV or trich seen.    A:  V-v atrophy, possibly with superimposed yeast vaginitis.  R/o UTI since U/A had small amt leuk and pos blood  P:  Urine culture.  If negative, will need to repeat U/A to check blood.  Rx  Difucan, and osphena - pt states she will find a way to pay for it because sx's are so uncomfortable.  F/U for AnEx later this week.

## 2013-06-06 NOTE — Telephone Encounter (Signed)
Patient would like you to return her call.

## 2013-06-06 NOTE — Telephone Encounter (Signed)
Spoke with pt about her urine culture results, An Rx was called in to [facility] Bid 0 refills. Will do TOC on Annual Exam 06/13/13 cm

## 2013-06-12 ENCOUNTER — Encounter: Payer: Self-pay | Admitting: Certified Nurse Midwife

## 2013-06-13 ENCOUNTER — Ambulatory Visit (INDEPENDENT_AMBULATORY_CARE_PROVIDER_SITE_OTHER): Payer: Medicare Other | Admitting: Obstetrics and Gynecology

## 2013-06-13 ENCOUNTER — Encounter: Payer: Self-pay | Admitting: Obstetrics and Gynecology

## 2013-06-13 VITALS — BP 145/80 | HR 50 | Resp 14 | Ht 62.0 in | Wt 107.0 lb

## 2013-06-13 DIAGNOSIS — Z01419 Encounter for gynecological examination (general) (routine) without abnormal findings: Secondary | ICD-10-CM

## 2013-06-13 NOTE — Patient Instructions (Signed)

## 2013-06-13 NOTE — Progress Notes (Signed)
72 y.o.   Married    Caucasian   female   G2P2002   here for annual exam.  Hasnt started osphena yet but plans to pick it up today   No LMP recorded. Patient is postmenopausal.          Sexually active: yes  The current method of family planning is post menopausal status.    Exercising: not now Last mammogram: 04/2013 normal , dense breast tissue Last pap smear:05/24/10 History of abnormal pap: no Smoking:never Alcohol:no Last colonoscopy:12/2012 colitis in stomach and digestive tract. Is still taking her steroids po for the colitis Last Bone Density:  05/2012 osteopenia Last tetanus shot: less than 10years Last cholesterol check: 2014 normal  Hgb: pcp               Urine: pcp   Family History  Problem Relation Age of Onset  . Arrhythmia Mother   . Osteoporosis Mother   . Cancer Mother     colon  . Diabetes Mother   . Hypertension Mother   . Bone cancer Father   . Hypertension Father   . Thyroid disease Father     hypo  . Brain cancer Brother     Patient Active Problem List   Diagnosis Date Noted  . Diarrhea 01/06/2013  . Nausea with vomiting 01/06/2013  . Abdominal pain, right upper quadrant 01/06/2013  . Palpitations 01/06/2013  . Bright red blood per rectum 01/06/2013  . Dehydration 01/06/2013  . Hypokalemia 01/06/2013  . Hyponatremia 01/06/2013  . Acute renal failure 01/06/2013  . Migraine 03/14/2012  . Dizziness 03/14/2012  . Bicuspid aortic valve 02/27/2012  . Hypertension 02/27/2012  . Hyperlipidemia 02/27/2012    Past Medical History  Diagnosis Date  . Hypertension   . Hyperlipidemia   . Aortic insufficiency   . Vertigo   . Bicuspid aortic valve   . Colitis, collagenous summer of 2013  . Pneumonia ~ 1944; ~ 2009  . GERD (gastroesophageal reflux disease)     "/endoscopy; doesn't bother me at all" (01/06/2013)  . Migraine     "used to have once/month; none in years" (01/06/2013)  . Arthritis     "hands" (01/06/2013)  . Hard of hearing     bilat  .  DDD (degenerative disc disease) 12/2009  . Anxiety   . Bicuspid aortic valve     Past Surgical History  Procedure Laterality Date  . Tonsillectomy      "when I was little" (01/06/2013)  . Tubal ligation Bilateral 1970's  . Tumor removal  1990's & 06/23/2012    "benign; roof of mouth" (01/06/2013)  . Thyroidectomy, partial      "removed gland from left side of neck that was to go down and past thyroid in front" (01/06/2013)  . Colonoscopy N/A 01/07/2013    Procedure: COLONOSCOPY;  Surgeon: Florencia Reasons, MD;  Location[facility] ENDOSCOPY;  Service: Endoscopy;  Laterality: N/A;  The colonoscopy will be unprepped  . Esophagogastroduodenoscopy N/A 01/07/2013    Procedure: ESOPHAGOGASTRODUODENOSCOPY (EGD);  Surgeon: Florencia Reasons, MD;  Location: [facility] ENDOSCOPY;  Service: Endoscopy;  Laterality: N/A;  . Dilation and curettage of uterus  2002    w/hysteroscopy  . Hysteroscopy  2002    D&C(sub fibroid)   . Hysteroscopy  2006    resection polyps D&C    Allergies: Advil; Ibuprofen; Metronidazole; and Ondansetron  Current Outpatient Prescriptions  Medication Sig Dispense Refill  . atorvastatin (LIPITOR) 20 MG tablet Take 1/2 daily      .  BIOTIN 5000 PO Take 5,000 mcg by mouth daily.      . budesonide (ENTOCORT EC) 3 MG 24 hr capsule Take 3 capsules (9 mg total) by mouth daily.  90 capsule  0  . Cholecalciferol (VITAMIN D-3 PO) Take 2,000 Units by mouth daily.      . Escitalopram Oxalate (LEXAPRO PO) Take by mouth daily.      . fluticasone (FLONASE) 50 MCG/ACT nasal spray       . ibandronate (BONIVA) 150 MG tablet       . loperamide (IMODIUM) 2 MG capsule Take 2 capsules (4 mg total) by mouth 4 (four) times daily.  360 capsule  0  . Multiple Vitamin (MULITIVITAMIN WITH MINERALS) TABS Take 1 tablet by mouth daily.      . nitrofurantoin, macrocrystal-monohydrate, (MACROBID) 100 MG capsule 2 (two) times daily.       . Olmesartan Medoxomil (BENICAR PO) Take by mouth daily.      . potassium chloride  (K-DUR) 10 MEQ tablet Take 1 tablet (10 mEq total) by mouth daily.  30 tablet  0  . potassium chloride (K-DUR,KLOR-CON) 10 MEQ tablet       . PRESCRIPTION MEDICATION Inhale 1 spray into the lungs daily as needed (nasal spray for congestion).      . tretinoin (RETIN-A) 0.05 % cream       . triamterene-hydrochlorothiazide (MAXZIDE-25) 37.5-25 MG per tablet       . estradiol (ESTRACE) 0.1 MG/GM vaginal cream Place 2 g vaginally daily.      . feeding supplement (RESOURCE BREEZE) LIQD Take 1 Container by mouth 3 (three) times daily between meals.  90 Container  0  . Ospemifene (OSPHENA) 60 MG TABS Take 1 tablet by mouth daily.  30 tablet  12  . promethazine (PHENERGAN) 25 MG tablet Take 1 tablet (25 mg total) by mouth every 6 (six) hours as needed for nausea.  30 tablet  0   No current facility-administered medications for this visit.    ROS: Pertinent items are noted in HPI.  Social Hx: married, two children, husband has prostate cancer under conservative tx,    Exam:    BP 145/80  Pulse 50  Resp 14  Ht 5\' 2"  (1.575 m)  Wt 107 lb (48.535 kg)  BMI 19.57 kg/m2Ht down 3/4", wt down 6 pounds from last year   Wt Readings from Last 3 Encounters:  06/13/13 107 lb (48.535 kg)  06/02/13 109 lb (49.442 kg)  01/07/13 102 lb (46.267 kg)     Ht Readings from Last 3 Encounters:  06/13/13 5\' 2"  (1.575 m)  06/02/13 5' 2.75" (1.594 m)  01/07/13 5\' 3"  (1.6 m)    General appearance: alert, cooperative and appears stated age Head: Normocephalic, without obvious abnormality, atraumatic Neck: no adenopathy, supple, symmetrical, trachea midline and thyroid not enlarged, symmetric, no tenderness/mass/nodules Lungs: clear to auscultation bilaterally Breasts: Inspection negative, No nipple retraction or dimpling, No nipple discharge or bleeding, No axillary or supraclavicular adenopathy, Normal to palpation without dominant masses Heart: regular rate and rhythm Abdomen: soft, non-tender; bowel sounds  normal; no masses,  no organomegaly Extremities: extremities normal, atraumatic, no cyanosis or edema Skin: Skin color, texture, turgor normal. No rashes or lesions Lymph nodes: Cervical, supraclavicular, and axillary nodes normal. No abnormal inguinal nodes palpated Neurologic: Grossly normal   Pelvic: External genitalia:  no lesions              Urethra:  normal appearing urethra with no masses, tenderness  or lesions              Bartholins and Skenes: normal                 Vagina: normal appearing vagina with normal color and discharge, no lesions, atrophic              Cervix: normal appearance              Pap taken: no        Bimanual Exam:  Uterus:  uterus is normal size, shape, consistency and nontender                                      Adnexa: normal adnexa in size, nontender and no masses                                      Rectovaginal: Confirms                                      Anus:  normal sphincter tone, no lesions  A: normal menopausal exam, no HRT     S/p hysteroscopic resection x 2 in 2002, 2006     Vag dryness and dyspareunia     Migraines, anxiety, decreased hearing, DDDz, HTN     P:     mammogram counseled on breast self exam, mammography screening, adequate intake of calcium and vitamin D, diet and exercise return annually or prn     An After Visit Summary was printed and given to the patient.

## 2013-07-14 ENCOUNTER — Other Ambulatory Visit: Payer: Self-pay

## 2013-07-14 MED ORDER — POTASSIUM CHLORIDE CRYS ER 10 MEQ PO TBCR: 10.0000 meq | EXTENDED_RELEASE_TABLET | Freq: Every day | ORAL | Status: DC

## 2013-07-24 ENCOUNTER — Telehealth: Payer: Self-pay | Admitting: *Deleted

## 2013-07-24 NOTE — Telephone Encounter (Signed)
Informed pt that Dr Irving Shows states biopsy of 07/09/2013 was a benign lesion.  Pt states is doing well.  See biopsy result 07/09/2013.

## 2013-10-14 ENCOUNTER — Other Ambulatory Visit: Payer: Self-pay

## 2013-10-14 MED ORDER — POTASSIUM CHLORIDE CRYS ER 10 MEQ PO TBCR: 10.0000 meq | EXTENDED_RELEASE_TABLET | Freq: Every day | ORAL | Status: DC

## 2013-10-23 HISTORY — PX: CATARACT EXTRACTION EXTRACAPSULAR: SHX1305

## 2013-11-20 ENCOUNTER — Encounter: Payer: Self-pay | Admitting: Podiatrist

## 2014-01-13 DIAGNOSIS — D1039 Benign neoplasm of other parts of mouth: Secondary | ICD-10-CM | POA: Insufficient documentation

## 2014-01-21 ENCOUNTER — Other Ambulatory Visit: Payer: Self-pay

## 2014-01-21 MED ORDER — POTASSIUM CHLORIDE CRYS ER 10 MEQ PO TBCR: 10.0000 meq | EXTENDED_RELEASE_TABLET | Freq: Every day | ORAL | Status: DC

## 2014-01-22 ENCOUNTER — Telehealth: Payer: Self-pay

## 2014-01-22 MED ORDER — HYDROCHLOROTHIAZIDE 25 MG PO TABS: 25.0000 mg | ORAL_TABLET | Freq: Every day | ORAL | Status: DC

## 2014-01-22 NOTE — Telephone Encounter (Signed)
I have seen her for many years.  I would be willing to refill it and get her in to see [place] and get labwork in the next month or so.  I have openings in [place] if needed.

## 2014-01-22 NOTE — Telephone Encounter (Signed)
Patient is requesting refill on hctz 25 mg, however she has not been here since 02/27/2012 . We have placed on her rx for her to call office to make appointment and she has not made an appointment. Please advise

## 2014-01-22 NOTE — Telephone Encounter (Signed)
Refill completed//appp made.

## 2014-01-22 NOTE — Addendum Note (Signed)
Addended by: Jonathon Jordan on: 01/22/2014 02:53 PM   Modules accepted: Orders

## 2014-01-22 NOTE — Telephone Encounter (Signed)
Pt should call her pcp for refills/ pt has not been seen for 2 years.

## 2014-02-16 ENCOUNTER — Other Ambulatory Visit: Payer: Self-pay | Admitting: Cardiovascular Disease

## 2014-03-11 ENCOUNTER — Ambulatory Visit: Payer: Medicare Other | Admitting: Cardiovascular Disease

## 2014-03-23 ENCOUNTER — Other Ambulatory Visit: Payer: Self-pay | Admitting: Cardiovascular Disease

## 2014-04-13 ENCOUNTER — Other Ambulatory Visit: Payer: Self-pay

## 2014-04-13 DIAGNOSIS — Z1231 Encounter for screening mammogram for malignant neoplasm of breast: Secondary | ICD-10-CM

## 2014-04-15 ENCOUNTER — Encounter: Payer: Self-pay | Admitting: Certified Nurse Midwife

## 2014-04-15 ENCOUNTER — Ambulatory Visit (INDEPENDENT_AMBULATORY_CARE_PROVIDER_SITE_OTHER): Payer: Medicare Other | Admitting: Certified Nurse Midwife

## 2014-04-15 VITALS — BP 110/70 | HR 68 | Temp 97.8°F | Resp 16 | Ht 62.0 in | Wt 112.0 lb

## 2014-04-15 DIAGNOSIS — B3731 Acute candidiasis of vulva and vagina: Secondary | ICD-10-CM

## 2014-04-15 DIAGNOSIS — N390 Urinary tract infection, site not specified: Secondary | ICD-10-CM

## 2014-04-15 DIAGNOSIS — N952 Postmenopausal atrophic vaginitis: Secondary | ICD-10-CM

## 2014-04-15 DIAGNOSIS — B373 Candidiasis of vulva and vagina: Secondary | ICD-10-CM

## 2014-04-15 LAB — POCT URINALYSIS DIPSTICK
Bilirubin, UA: NEGATIVE
Glucose, UA: NEGATIVE
Ketones, UA: NEGATIVE
NITRITE UA: NEGATIVE
PH UA: 5
PROTEIN UA: NEGATIVE
RBC UA: NEGATIVE
UROBILINOGEN UA: NEGATIVE

## 2014-04-15 MED ORDER — NITROFURANTOIN MONOHYD MACRO 100 MG PO CAPS: 100.0000 mg | ORAL_CAPSULE | Freq: Two times a day (BID) | ORAL | Status: DC

## 2014-04-15 MED ORDER — NYSTATIN 100000 UNIT/GM EX CREA: 1.0000 "application " | TOPICAL_CREAM | Freq: Two times a day (BID) | CUTANEOUS | Status: DC

## 2014-04-15 NOTE — Progress Notes (Signed)
72 y.o.married white female g2p2002 here with complaint of UTI, with onset  on 2-3 weeks. Patient complaining of urinary frequency/urgency/ and pain with urination. Patient denies fever, chills, nausea or back pain. No new personal products. Patient not sexual activity. Denies any vaginal symptoms or new personal products. Menopausal with vaginal dryness. Sees cardiology and PCP on regular basis. Recent benign tumor in mouth removed at [facility] 5/12 and has cataract surgery also. No other health issues.  O: Healthy female WDWN Affect: Normal, orientation x 3 Skin : warm and dry CVAT: negative bilateral Abdomen: positive for suprapubic tenderness  Pelvic exam: External genital area;atrophic appearance, no lesions, increase pink with slight exudate, wet prep taken Bladder,Urethra, Urethral meatus: tender Vagina: scant vaginal discharge,atrophic appearance  Wet prep, ph 4.5 Cervix: normal, non tender Uterus:normal,non tender Adnexa: normal non tender, no fullness or masses   A: UTI Poct urine 2+ WBC Yeast vulvitis Atrophic vaginitis P: Reviewed findings of UTI VC:[id number] see order FMB:[id number] micro, culture Reviewed warning signs and symptoms of UTI Encouraged to limit soda, tea, and coffee Reviewed findings of yeast and need for treatment to stop burning in vaginal area.  Rx Nystatin cream see order Discussed vaginal dryness can contribute to both of her issues today. She has Estrace cream and does not use. Discussed Coconut oil as an alternative, after treatment, patient may try. Questions addressed. RV 2 week if TOC needed for positive culture  RV prn

## 2014-04-15 NOTE — Patient Instructions (Signed)
Urinary Tract Infection Urinary tract infections (UTIs) can develop anywhere along your urinary tract. Your urinary tract is your body's drainage system for removing wastes and extra water. Your urinary tract includes two kidneys, two ureters, a bladder, and a urethra. Your kidneys are a pair of bean-shaped organs. Each kidney is about the size of your fist. They are located below your ribs, one on each side of your spine. CAUSES Infections are caused by microbes, which are microscopic organisms, including fungi, viruses, and bacteria. These organisms are so small that they can only be seen through a microscope. Bacteria are the microbes that most commonly cause UTIs. SYMPTOMS  Symptoms of UTIs may vary by age and gender of the patient and by the location of the infection. Symptoms in young women typically include a frequent and intense urge to urinate and a painful, burning feeling in the bladder or urethra during urination. Older women and men are more likely to be tired, shaky, and weak and have muscle aches and abdominal pain. A fever may mean the infection is in your kidneys. Other symptoms of a kidney infection include pain in your back or sides below the ribs, nausea, and vomiting. DIAGNOSIS To diagnose a UTI, your caregiver will ask you about your symptoms. Your caregiver also will ask to provide a urine sample. The urine sample will be tested for bacteria and white blood cells. White blood cells are made by your body to help fight infection. TREATMENT  Typically, UTIs can be treated with medication. Because most UTIs are caused by a bacterial infection, they usually can be treated with the use of antibiotics. The choice of antibiotic and length of treatment depend on your symptoms and the type of bacteria causing your infection. HOME CARE INSTRUCTIONS  If you were prescribed antibiotics, take them exactly as your caregiver instructs you. Finish the medication even if you feel better after you  have only taken some of the medication.  Drink enough water and fluids to keep your urine clear or pale yellow.  Avoid caffeine, tea, and carbonated beverages. They tend to irritate your bladder.  Empty your bladder often. Avoid holding urine for long periods of time.  Empty your bladder before and after sexual intercourse.  After a bowel movement, women should cleanse from front to back. Use each tissue only once. SEEK MEDICAL CARE IF:   You have back pain.  You develop a fever.  Your symptoms do not begin to resolve within 3 days. SEEK IMMEDIATE MEDICAL CARE IF:   You have severe back pain or lower abdominal pain.  You develop chills.  You have nausea or vomiting.  You have continued burning or discomfort with urination. MAKE SURE YOU:   Understand these instructions.  Will watch your condition.  Will get help right away if you are not doing well or get worse. Document Released: 07/19/2005 Document Revised: 04/09/2012 Document Reviewed: 11/17/2011 [facility] Patient Information 2015 [place], [place]. This information is not intended to replace advice given to you by your health care provider. Make sure you discuss any questions you have with your health care provider. Atrophic Vaginitis Atrophic vaginitis is a problem of low levels of estrogen in women. This problem can happen at any age. It is most common in women who have gone through menopause ("the change").  HOW WILL I KNOW IF I HAVE THIS PROBLEM? You may have:  Trouble with peeing (urinating), such as:  Going to the bathroom often.  A hard time holding your pee  until you reach a bathroom.  Leaking pee.  Having pain when you pee.  Itching or a burning feeling.  Vaginal bleeding and spotting.  Pain during sex.  Dryness of the vagina.  A yellow, bad-smelling fluid (discharge) coming from the vagina. HOW WILL MY DOCTOR CHECK FOR THIS PROBLEM?  During your exam, your doctor will likely find the  problem.  If there is a vaginal fluid, it may be checked for infection. HOW WILL THIS PROBLEM BE TREATED? Keep the vulvar skin as clean as possible. Moisturizers and lubricants can help with some of the symptoms. Estrogen replacement can help. There are 2 ways to take estrogen:  Systemic estrogen gets estrogen to your whole body. It takes many weeks or months before the symptoms get better.  You take an estrogen pill.  You use a skin patch. This is a patch that you put on your skin.  If you still have your uterus, your doctor may ask you to take a hormone. Talk to your doctor about the right medicine for you.  Estrogen cream.  This puts estrogen only at the part of your body where you apply it. The cream is put into the vagina or put on the vulvar skin. For some women, estrogen cream works faster than pills or the patch. CAN ALL WOMEN WITH THIS PROBLEM USE ESTROGEN? No. Women with certain types of cancer, liver problems, or problems with blood clots should not take estrogen. Your doctor can help you decide the best treatment for your symptoms. Document Released: 03/27/2008 Document Revised: 10/14/2013 Document Reviewed: 03/27/2008 [facility] Patient Information 2015 [place], [place]. This information is not intended to replace advice given to you by your health care provider. Make sure you discuss any questions you have with your health care provider.

## 2014-04-16 LAB — URINALYSIS, MICROSCOPIC ONLY
BACTERIA UA: NONE SEEN
CASTS: NONE SEEN
Crystals: NONE SEEN
Squamous Epithelial / LPF: NONE SEEN

## 2014-04-18 LAB — URINE CULTURE: Colony Count: >=100000

## 2014-04-20 NOTE — Progress Notes (Signed)
Reviewed personally.  M. Suzanne Miller, MD.  

## 2014-04-27 ENCOUNTER — Telehealth: Payer: Self-pay | Admitting: Certified Nurse Midwife

## 2014-04-27 NOTE — Telephone Encounter (Addendum)
Called patient on cell phone. Patient states she completed the 7 days of Macrobid on 04/25/14 and felt much better until 7/5 when she woke up and had dysuria again. She did not miss any doses and completed all medication. She is using AZO with relief, but dysuria starts again after AZO. [place] so [place] come for office visit. Denies fevers and flank pain, but states she had chills yesterday, is not able to take temperature. I advised I would send message to Regina Eck CNM to obtain instructions and plan for care and return her call.

## 2014-04-27 NOTE — Telephone Encounter (Signed)
Reviewed with Dr. Charlies Constable. Per Dr. Charlies Constable, since patient is out of town and cannot come in for office visit, patient should seek care at urgent care near where she is so that she can be evaluated and urine culture be completed. Advised patient of recommendations from Dr. Charlies Constable and she is agreeable. She will follow up with test of cure when returns to area.  Will call back with any further concerns.  Routing to provider for final review. Patient agreeable to disposition. Will close encounter

## 2014-04-27 NOTE — Telephone Encounter (Signed)
Patient was seen for uti on 04/15/14 is supposed to come back for TOC on 05/05/14. She said that the uti got better but now has come back again. She is out of town and will not be back til the end of the week.

## 2014-05-04 ENCOUNTER — Encounter: Payer: Self-pay | Admitting: Cardiovascular Disease

## 2014-05-04 ENCOUNTER — Ambulatory Visit (INDEPENDENT_AMBULATORY_CARE_PROVIDER_SITE_OTHER): Payer: Medicare Other | Admitting: Cardiovascular Disease

## 2014-05-04 VITALS — BP 130/80 | HR 57 | Ht 62.0 in | Wt 112.0 lb

## 2014-05-04 DIAGNOSIS — I10 Essential (primary) hypertension: Secondary | ICD-10-CM

## 2014-05-04 DIAGNOSIS — Q231 Congenital insufficiency of aortic valve: Secondary | ICD-10-CM

## 2014-05-04 DIAGNOSIS — E785 Hyperlipidemia, unspecified: Secondary | ICD-10-CM

## 2014-05-04 NOTE — Patient Instructions (Signed)
Your physician recommends that you continue on your current medications as directed. Please refer to the Current Medication list given to you today.  Your physician wants you to follow-up in: 1 year with Dr. Nahser.  You will receive a reminder letter in the mail two months in advance. If you don't receive a letter, please call our office to schedule the follow-up appointment.  

## 2014-05-04 NOTE — Progress Notes (Signed)
Bailey [name] Date of Birth  [birth year]       [facility]    [employer] [address], [address], [place], [place]  [address]   [place], [place]  [address] [phone]     [phone]   Fax  [phone]    Fax [phone]  Problem List: 1. Bicuspid aortic valve / moderate aortic insufficiency 2. Dyslipidemia 3. Hypertension  History of Present Illness:  Bailey [name] is a 73 y.o. female with the above medical problems.  She has had chronic diarrhea and was found to have colitis. She is taking some medications and is getting better.  She is exercising some.  She has been spending a lot of time at the nursing home taking care of her mother. She's not been able to exercise as much she would like.  She's not had any cardiac problems doing her normal daily activities.  May 04, 2014:  Bailey [name] is doing ok.  She has had some fatigue.  Had  colitis.  She had acute renal failure and was very dehydrated.      Current Outpatient Prescriptions on File Prior to Visit  Medication Sig Dispense Refill  . atorvastatin (LIPITOR) 20 MG tablet Take 1/2 daily      . BIOTIN 5000 PO Take 5,000 mcg by mouth daily.      . Cholecalciferol (VITAMIN D-3 PO) Take 2,000 Units by mouth daily.      Marland Kitchen escitalopram (LEXAPRO) 10 MG tablet Take 5 mg by mouth daily.      . fluticasone (FLONASE) 50 MCG/ACT nasal spray       . hydrochlorothiazide (HYDRODIURIL) 25 MG tablet Take 1 tablet (25 mg total) by mouth daily.  90 tablet  0  . Multiple Vitamin (MULITIVITAMIN WITH MINERALS) TABS Take 1 tablet by mouth daily.      . nebivolol (BYSTOLIC) 10 MG tablet Take 10 mg by mouth daily.      . potassium chloride (K-DUR) 10 MEQ tablet Take 1 tablet (10 mEq total) by mouth daily.  30 tablet  0  . Probiotic Product (PROBIOTIC DAILY PO) Take 1 capsule by mouth daily.      Marland Kitchen tretinoin (RETIN-A) 0.05 % cream       . triamterene-hydrochlorothiazide (MAXZIDE-25) 37.5-25 MG per tablet         No current facility-administered medications on file prior to visit.    Allergies  Allergen Reactions  . Advil [Ibuprofen]     vomiting  . Ibuprofen Nausea And Vomiting    vertigo  . Metronidazole Diarrhea and Nausea And Vomiting  . Ondansetron Nausea And Vomiting    Past Medical History  Diagnosis Date  . Hypertension   . Hyperlipidemia   . Aortic insufficiency   . Vertigo   . Bicuspid aortic valve   . Colitis, collagenous summer of 2013  . Pneumonia ~ 1944; ~ 2009  . GERD (gastroesophageal reflux disease)     "/endoscopy; doesn't bother me at all" (01/06/2013)  . Migraine     "used to have once/month; none in years" (01/06/2013)  . Arthritis     "hands" (01/06/2013)  . Hard of hearing     bilat  . DDD (degenerative disc disease) 12/2009  . Anxiety   . Bicuspid aortic valve     Past Surgical History  Procedure Laterality Date  . Tonsillectomy      "when I was little" (01/06/2013)  . Tubal ligation Bilateral 1970's  .  Tumor removal  1990's & 06/23/2012, 02/2014    "benign; roof of mouth" (01/06/2013)  . Thyroidectomy, partial      "removed gland from left side of neck that was to go down and past thyroid in front" (01/06/2013)  . Colonoscopy N/A 01/07/2013    Procedure: COLONOSCOPY;  Surgeon: Cleotis Nipper, MD;  Location: [facility] ENDOSCOPY;  Service: Endoscopy;  Laterality: N/A;  The colonoscopy will be unprepped  . Esophagogastroduodenoscopy N/A 01/07/2013    Procedure: ESOPHAGOGASTRODUODENOSCOPY (EGD);  Surgeon: Cleotis Nipper, MD;  Location: [facility] ENDOSCOPY;  Service: Endoscopy;  Laterality: N/A;  . Dilation and curettage of uterus  2002    w/hysteroscopy  . Hysteroscopy  2002    D&C(sub fibroid)   . Hysteroscopy  2006    resection polyps D&C  . Cataract extraction extracapsular Bilateral 2015    History  Smoking status  . Never Smoker   Smokeless tobacco  . Never Used    History  Alcohol Use No    Family History  Problem Relation Age of Onset  .  Arrhythmia Mother   . Osteoporosis Mother   . Cancer Mother     colon  . Diabetes Mother   . Hypertension Mother   . Bone cancer Father   . Hypertension Father   . Thyroid disease Father     hypo  . Brain cancer Brother     Reviw of Systems:  Reviewed in the HPI.  All other systems are negative.  Physical Exam: Blood pressure 130/80, pulse 57, height 5\' 2"  (1.575 m), weight 112 lb (50.803 kg). General: Well developed, well nourished, in no acute distress.  Head: Normocephalic, atraumatic, sclera non-icteric, mucus membranes are moist,   Neck: Supple. Carotids are 2 + without bruits. No JVD  Lungs: Clear bilaterally to auscultation.  Heart: regular rate.  normal  S1 S2.  There is a 1-2/2 systolic murmur at the LSB.  Abdomen: Soft, non-tender, non-distended with normal bowel sounds. No hepatomegaly. No rebound/guarding. Her abdominal aorta is palpable in her midline.  Msk:  Strength and tone are normal  Extremities: No clubbing or cyanosis. No edema.  Distal pedal pulses are 2+ and equal bilaterally.  Neuro: Alert and oriented X 3. Moves all extremities spontaneously.  Psych:  Responds to questions appropriately with a normal affect.  ECG: May 04, 2104:  NSR at 57, no ST or T wave changes.   Assessment / Plan:

## 2014-05-04 NOTE — Assessment & Plan Note (Signed)
She has what probably is a bicuspid aortic valve. His moderate aortic insufficiency. She has not had any symptoms. Her last echocardiogram was in 2013 in her aortic size was normal.  I will  again in one year for followup visit.

## 2014-05-05 ENCOUNTER — Ambulatory Visit (INDEPENDENT_AMBULATORY_CARE_PROVIDER_SITE_OTHER): Payer: Medicare Other | Admitting: Certified Nurse Midwife

## 2014-05-05 ENCOUNTER — Encounter: Payer: Self-pay | Admitting: Certified Nurse Midwife

## 2014-05-05 VITALS — BP 110/70 | HR 68 | Resp 16 | Ht 62.0 in | Wt 115.0 lb

## 2014-05-05 DIAGNOSIS — N390 Urinary tract infection, site not specified: Secondary | ICD-10-CM

## 2014-05-05 LAB — POCT URINALYSIS DIPSTICK
Bilirubin, UA: NEGATIVE
Blood, UA: NEGATIVE
Glucose, UA: NEGATIVE
KETONES UA: NEGATIVE
LEUKOCYTES UA: NEGATIVE
Nitrite, UA: NEGATIVE
PH UA: 7
PROTEIN UA: NEGATIVE
UROBILINOGEN UA: NEGATIVE

## 2014-05-05 NOTE — Progress Notes (Signed)
73 y.o. Married Caucasian female G2P2002 here for follow up of UTI treated with Macrobid initiated on 04/15/14. Completed all medication as directed.  Patient went on vacation and started with symptoms again as had a urine culture which again showed E. Coli. Was treated with Cipro and her symptoms now have resolved. Here for repeat culture/follow up. Patient was also treated for yeast vulvitis(04/15/14) and completed cream as ordered, all symptoms have resolved. Poct urine- neg, ph 7.0  O: Healthy WD,WN female Affect: Normal Skin:Warm and dry CVAT negative bilateral Abdomen:negative suprapubic Pelvic exam:EXTERNAL GENITALIA: normal appearing vulva with no masses, tenderness or lesions Bladder,urethra, urethral meatus all normal, non tender  A:E.Coli UTI probably resolved  Yeast vulvitis resolved per symptoms no wet prep desired    P: Discussed findings of normal exam regarding UTI and Yeast.  Labs :Urine micro/culture   RV prn

## 2014-05-05 NOTE — Progress Notes (Signed)
Reviewed personally.  M. Suzanne Waris Rodger, MD.  

## 2014-05-06 LAB — URINALYSIS, MICROSCOPIC ONLY
BACTERIA UA: NONE SEEN
CRYSTALS: NONE SEEN
Casts: NONE SEEN
Squamous Epithelial / LPF: NONE SEEN

## 2014-05-06 LAB — URINE CULTURE
Colony Count: NO GROWTH
Organism ID, Bacteria: NO GROWTH

## 2014-05-18 DIAGNOSIS — H9319 Tinnitus, unspecified ear: Secondary | ICD-10-CM | POA: Insufficient documentation

## 2014-05-20 ENCOUNTER — Ambulatory Visit
Admission: RE | Admit: 2014-05-20 | Discharge: 2014-05-20 | Disposition: A | Discharge status: Home / Self Care | Payer: Medicare Other | Source: Ambulatory Visit

## 2014-05-20 DIAGNOSIS — Z1231 Encounter for screening mammogram for malignant neoplasm of breast: Secondary | ICD-10-CM

## 2014-06-22 ENCOUNTER — Ambulatory Visit (INDEPENDENT_AMBULATORY_CARE_PROVIDER_SITE_OTHER): Payer: Medicare Other | Admitting: Obstetrics and Gynecology

## 2014-06-22 ENCOUNTER — Encounter: Payer: Self-pay | Admitting: Obstetrics and Gynecology

## 2014-06-22 VITALS — BP 120/76 | HR 50 | Resp 18 | Ht 62.75 in | Wt 110.4 lb

## 2014-06-22 DIAGNOSIS — Z01419 Encounter for gynecological examination (general) (routine) without abnormal findings: Secondary | ICD-10-CM

## 2014-06-22 NOTE — Patient Instructions (Addendum)
EXERCISE AND DIET:  We recommended that you start or continue a regular exercise program for good health. Regular exercise means any activity that makes your heart beat faster and makes you sweat.  We recommend exercising at least 30 minutes per day at least 3 days a week, preferably 4 or 5.  We also recommend a diet low in fat and sugar.  Inactivity, poor dietary choices and obesity can cause diabetes, heart attack, stroke, and kidney damage, among others.    ALCOHOL AND SMOKING:  Women should limit their alcohol intake to no more than 7 drinks/beers/glasses of wine (combined, not each!) per week. Moderation of alcohol intake to this level decreases your risk of breast cancer and liver damage. And of course, no recreational drugs are part of a healthy lifestyle.  And absolutely no smoking or even second hand smoke. Most people know smoking can cause heart and lung diseases, but did you know it also contributes to weakening of your bones? Aging of your skin?  Yellowing of your teeth and nails?  CALCIUM AND VITAMIN D:  Adequate intake of calcium and Vitamin D are recommended.  The recommendations for exact amounts of these supplements seem to change often, but generally speaking 600 mg of calcium (either carbonate or citrate) and 800 units of Vitamin D per day seems prudent. Certain women may benefit from higher intake of Vitamin D.  If you are among these women, your doctor will have told you during your visit.    PAP SMEARS:  Pap smears, to check for cervical cancer or precancers,  have traditionally been done yearly, although recent scientific advances have shown that most women can have pap smears less often.  However, every woman still should have a physical exam from her gynecologist every year. It will include a breast check, inspection of the vulva and vagina to check for abnormal growths or skin changes, a visual exam of the cervix, and then an exam to evaluate the size and shape of the uterus and  ovaries.  And after 72 years of age, a rectal exam is indicated to check for rectal cancers. We will also provide age appropriate advice regarding health maintenance, like when you should have certain vaccines, screening for sexually transmitted diseases, bone density testing, colonoscopy, mammograms, etc.   MAMMOGRAMS:  All women over 40 years old should have a yearly mammogram. Many facilities now offer a "3D" mammogram, which may cost around $50 extra out of pocket. If possible,  we recommend you accept the option to have the 3D mammogram performed.  It both reduces the number of women who will be called back for extra views which then turn out to be normal, and it is better than the routine mammogram at detecting truly abnormal areas.    COLONOSCOPY:  Colonoscopy to screen for colon cancer is recommended for all women at age 50.  We know, you hate the idea of the prep.  We agree, BUT, having colon cancer and not knowing it is worse!!  Colon cancer so often starts as a polyp that can be seen and removed at colonscopy, which can quite literally save your life!  And if your first colonoscopy is normal and you have no family history of colon cancer, most women don't have to have it again for 10 years.  Once every ten years, you can do something that may end up saving your life, right?  We will be happy to help you get it scheduled when you are ready.    Be sure to check your insurance coverage so you understand how much it will cost.  It may be covered as a preventative service at no cost, but you should check your particular policy.     Atrophic Vaginitis Atrophic vaginitis is a problem of low levels of estrogen in women. This problem can happen at any age. It is most common in women who have gone through menopause ("the change").  HOW WILL I KNOW IF I HAVE THIS PROBLEM? You may have:  Trouble with peeing (urinating), such as:  Going to the bathroom often.  A hard time holding your pee until you reach  a bathroom.  Leaking pee.  Having pain when you pee.  Itching or a burning feeling.  Vaginal bleeding and spotting.  Pain during sex.  Dryness of the vagina.  A yellow, bad-smelling fluid (discharge) coming from the vagina. HOW WILL MY DOCTOR CHECK FOR THIS PROBLEM?  During your exam, your doctor will likely find the problem.  If there is a vaginal fluid, it may be checked for infection. HOW WILL THIS PROBLEM BE TREATED? Keep the vulvar skin as clean as possible. Moisturizers and lubricants can help with some of the symptoms. Estrogen replacement can help. There are 2 ways to take estrogen:  Systemic estrogen gets estrogen to your whole body. It takes many weeks or months before the symptoms get better.  You take an estrogen pill.  You use a skin patch. This is a patch that you put on your skin.  If you still have your uterus, your doctor may ask you to take a hormone. Talk to your doctor about the right medicine for you.  Estrogen cream.  This puts estrogen only at the part of your body where you apply it. The cream is put into the vagina or put on the vulvar skin. For some women, estrogen cream works faster than pills or the patch. CAN ALL WOMEN WITH THIS PROBLEM USE ESTROGEN? No. Women with certain types of cancer, liver problems, or problems with blood clots should not take estrogen. Your doctor can help you decide the best treatment for your symptoms. Document Released: 03/27/2008 Document Revised: 10/14/2013 Document Reviewed: 03/27/2008 [employer] Patient Information 2015 [employer]. This information is not intended to replace advice given to you by your health care provider. Make sure you discuss any questions you have with your health care provider.  

## 2014-06-22 NOTE — Progress Notes (Signed)
Patient ID: Bailey [name], female   DOB: 10-02-1941, 73 y.o.   MRN: [id number] GYNECOLOGY VISIT  PCP:   Crist Infante, MD  Referring provider:   HPI: 73 y.o.   Married  Caucasian  female   G2P2002 with Patient's last menstrual period was 06/23/2010.   here for  AEX.   Patient diagnosed with colitis last year.  Was hospitalized with dehydration and acute renal failure. Did a prolonged course of steroid treatment.  Stopped Boniva after just had bone density study performed through PCP office.  Not taking Osphena anymore.  Did not help and was expensive.   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   Hgb:    PCP Urine:  PCP  GYNECOLOGIC HISTORY: Patient's last menstrual period was 06/23/2010. Sexually active:  Yes Partner preference: female Contraception:  postmenopausal   Menopausal hormone therapy:   DES exposure:   no Blood transfusions: no   Sexually transmitted diseases:  no GYN procedures and prior surgeries:  Tubal ligation, D & C, D & C/hysteroscopy/polypectomy Last mammogram:  05-20-14 heterogeneously dense breasts, otherwise normal:[facility]               Last pap and high risk HPV testing:  05-24-10 normal History of abnormal pap smear:  no   OB History   Grav Para Term Preterm Abortions TAB SAB Ect Mult Living   2 2 2       2        LIFESTYLE: Exercise:   no             OTHER HEALTH MAINTENANCE: Tetanus/TDap:  Up to date with PCP HPV:                  n/a Influenza:           07/2013   Bone density:     05/2014 Osteopenia through Dr. Joylene Draft Colonoscopy:     12/2012 hx of colitis.  Next colonoscopy due 12/2027 with Dr. Cristina Gong.  Cholesterol check:  Elevated--takes medication.  Followed by PCP.  Family History  Problem Relation Age of Onset  . Arrhythmia Mother   . Osteoporosis Mother   . Cancer Mother     colon  . Diabetes Mother   . Hypertension Mother   . Bone cancer Father   . Hypertension Father   . Thyroid disease Father    hypo  . Brain cancer Brother     Patient Active Problem List   Diagnosis Date Noted  . Diarrhea 01/06/2013  . Nausea with vomiting 01/06/2013  . Abdominal pain, right upper quadrant 01/06/2013  . Palpitations 01/06/2013  . Bright red blood per rectum 01/06/2013  . Dehydration 01/06/2013  . Hypokalemia 01/06/2013  . Hyponatremia 01/06/2013  . Acute renal failure 01/06/2013  . Migraine 03/14/2012  . Dizziness 03/14/2012  . Bicuspid aortic valve 02/27/2012  . Hypertension 02/27/2012  . Hyperlipidemia 02/27/2012   Past Medical History  Diagnosis Date  . Hypertension   . Hyperlipidemia   . Aortic insufficiency   . Vertigo   . Bicuspid aortic valve   . Colitis, collagenous summer of 2013  . Pneumonia ~ 1944; ~ 2009  . GERD (gastroesophageal reflux disease)     "/endoscopy; doesn't bother me at all" (01/06/2013)  . Migraine     "used to have once/month; none in years" (01/06/2013)  . Arthritis     "hands" (01/06/2013)  . Hard of hearing     bilat  .  DDD (degenerative disc disease) 12/2009  . Anxiety   . Bicuspid aortic valve     Past Surgical History  Procedure Laterality Date  . Tonsillectomy      "when I was little" (01/06/2013)  . Tubal ligation Bilateral 1970's  . Tumor removal  1990's & 06/23/2012, 02/2014    "benign; roof of mouth" (01/06/2013)  . Thyroidectomy, partial      "removed gland from left side of neck that was to go down and past thyroid in front" (01/06/2013)  . Colonoscopy N/A 01/07/2013    Procedure: COLONOSCOPY;  Surgeon: Cleotis Nipper, MD;  Location: [facility] ENDOSCOPY;  Service: Endoscopy;  Laterality: N/A;  The colonoscopy will be unprepped  . Esophagogastroduodenoscopy N/A 01/07/2013    Procedure: ESOPHAGOGASTRODUODENOSCOPY (EGD);  Surgeon: Cleotis Nipper, MD;  Location: [facility] ENDOSCOPY;  Service: Endoscopy;  Laterality: N/A;  . Dilation and curettage of uterus  2002    w/hysteroscopy  . Hysteroscopy  2002    D&C(sub fibroid)   . Hysteroscopy  2006     resection polyps D&C  . Cataract extraction extracapsular Bilateral 2015    ALLERGIES: Advil; Ibuprofen; Metronidazole; and Ondansetron  Current Outpatient Prescriptions  Medication Sig Dispense Refill  . atorvastatin (LIPITOR) 20 MG tablet Take 1/2 daily      . BIOTIN 5000 PO Take 5,000 mcg by mouth daily.      . Cholecalciferol (VITAMIN D-3 PO) Take 2,000 Units by mouth daily.      Marland Kitchen escitalopram (LEXAPRO) 10 MG tablet Take 5 mg by mouth daily.      . fluticasone (FLONASE) 50 MCG/ACT nasal spray       . hydrochlorothiazide (HYDRODIURIL) 25 MG tablet Take 1 tablet (25 mg total) by mouth daily.  90 tablet  0  . Multiple Vitamin (MULITIVITAMIN WITH MINERALS) TABS Take 1 tablet by mouth daily.      . nebivolol (BYSTOLIC) 10 MG tablet Take 10 mg by mouth daily.      . potassium chloride (K-DUR) 10 MEQ tablet Take 1 tablet (10 mEq total) by mouth daily.  30 tablet  0  . Probiotic Product (PROBIOTIC DAILY PO) Take 1 capsule by mouth daily.      Marland Kitchen tretinoin (RETIN-A) 0.05 % cream       . triamterene-hydrochlorothiazide (MAXZIDE-25) 37.5-25 MG per tablet        No current facility-administered medications for this visit.     ROS:  Pertinent items are noted in HPI.  History   Social History  . Marital Status: Married    Spouse Name: N/A    Number of Children: N/A  . Years of Education: N/A   Occupational History  . Not on file.   Social History Main Topics  . Smoking status: Never Smoker   . Smokeless tobacco: Never Used  . Alcohol Use: No  . Drug Use: No  . Sexual Activity: Yes    Partners: Male    Birth Control/ Protection: Post-menopausal   Other Topics Concern  . Not on file   Social History Narrative  . No narrative on file    PHYSICAL EXAMINATION:    BP 120/76  Pulse 50  Resp 18  Ht 5' 2.75" (1.594 m)  Wt 110 lb 6.4 oz (50.077 kg)  BMI 19.71 kg/m2  LMP 06/23/2010   Wt Readings from Last 3 Encounters:  06/22/14 110 lb 6.4 oz (50.077 kg)  05/05/14 115 lb  (52.164 kg)  05/04/14 112 lb (50.803 kg)  Ht Readings from Last 3 Encounters:  06/22/14 5' 2.75" (1.594 m)  05/05/14 5\' 2"  (1.575 m)  05/04/14 5\' 2"  (1.575 m)    General appearance: alert, cooperative and appears stated age Head: Normocephalic, without obvious abnormality, atraumatic Neck: no adenopathy, supple, symmetrical, trachea midline and thyroid not enlarged, symmetric, no tenderness/mass/nodules Lungs: clear to auscultation bilaterally Breasts: Inspection negative, No nipple retraction or dimpling, No nipple discharge or bleeding, No axillary or supraclavicular adenopathy, Normal to palpation without dominant masses Heart: regular rate and rhythm Abdomen: soft, non-tender; no masses,  no organomegaly Extremities: extremities normal, atraumatic, no cyanosis or edema Skin: Skin color, texture, turgor normal. No rashes or lesions Lymph nodes: Cervical, supraclavicular, and axillary nodes normal. No abnormal inguinal nodes palpated Neurologic: Grossly normal  Pelvic: External genitalia:  no lesions              Urethra:  normal appearing urethra with no masses, tenderness or lesions              Bartholins and Skenes: normal                 Vagina: normal appearing vagina with normal color and discharge, no lesions              Cervix: normal appearance              Pap done: Yes.          Bimanual Exam:  Uterus:  uterus is normal size, shape, consistency and nontender                                      Adnexa: normal adnexa in size, nontender and no masses                                      Rectovaginal:  No. Declined.                                       ASSESSMENT  Normal gynecologic exam. Osteopenia.  Doing drug holiday off Boniva. Vaginal atrophy.   PLAN  Mammogram recommended yearly starting at age 48. Pap smear and high risk HPV testing as above. Counseled on self breast exam, Calcium and vitamin D intake, exercise. See lab orders: No. Discussed vitamin  E and cooking oils for vaginal hydration.  Also mentioned vaginal estrogen cream if theses methods are not successful. Return annually or prn   An After Visit Summary was printed and given to the patient.

## 2014-06-24 LAB — IPS PAP SMEAR ONLY

## 2014-07-14 ENCOUNTER — Other Ambulatory Visit: Payer: Self-pay | Admitting: Cardiovascular Disease

## 2014-07-16 ENCOUNTER — Other Ambulatory Visit: Payer: Self-pay | Admitting: Dermatology

## 2014-07-30 ENCOUNTER — Other Ambulatory Visit: Payer: Self-pay | Admitting: Dermatology

## 2014-08-24 ENCOUNTER — Encounter: Payer: Self-pay | Admitting: Obstetrics and Gynecology

## 2014-08-24 ENCOUNTER — Ambulatory Visit (INDEPENDENT_AMBULATORY_CARE_PROVIDER_SITE_OTHER): Payer: Medicare Other | Admitting: Nurse Practitioner

## 2014-08-24 ENCOUNTER — Telehealth: Payer: Self-pay | Admitting: Obstetrics and Gynecology

## 2014-08-24 VITALS — BP 104/60 | Temp 98.7°F | Resp 12 | Ht 62.75 in | Wt 112.0 lb

## 2014-08-24 DIAGNOSIS — N390 Urinary tract infection, site not specified: Secondary | ICD-10-CM

## 2014-08-24 DIAGNOSIS — R319 Hematuria, unspecified: Secondary | ICD-10-CM

## 2014-08-24 DIAGNOSIS — R300 Dysuria: Secondary | ICD-10-CM

## 2014-08-24 LAB — POCT URINALYSIS DIPSTICK
Urobilinogen, UA: NEGATIVE
pH, UA: 5

## 2014-08-24 MED ORDER — PHENAZOPYRIDINE HCL 200 MG PO TABS: 200.0000 mg | ORAL_TABLET | Freq: Three times a day (TID) | ORAL | Status: DC | PRN

## 2014-08-24 MED ORDER — NITROFURANTOIN MONOHYD MACRO 100 MG PO CAPS: 100.0000 mg | ORAL_CAPSULE | Freq: Two times a day (BID) | ORAL | Status: DC

## 2014-08-24 NOTE — Progress Notes (Signed)
S:  73 y.o.Married Caucasian female presents with complaint of UTI. Symptoms began about  2 weeks ago.   Her primary symptoms aredysuria, urinary frequency, urinary urgency, and pressure lower abdomen. Pertinent negatives include The patient is having no constitutional symptoms, denying fever, chills, anorexia, or weight loss.. Sexually active yes  Symptoms are sometimes related to post coital.  She is  Menopausal and has Vaginal dryness.  Same partner without change.   ROS: no weight loss, fever, night sweats and feels well  O alert, oriented to person, place, and time, normal mood, behavior, speech, dress, motor activity, and thought processes   healthy,  alert and  not in acute distress  Abdomen: mild pressure lower abdomen  No CVA tenderness  cervix normal in appearance, external genitalia normal, no adnexal masses or tenderness, no cervical motion tenderness and no vaginal discharge   Diagnostic Test:    Urinalysis:  Trace of leukocytes   urine culture  Assessment: R/O UTI  Plan:  Maintain adequate hydration. Follow up if symptoms not improving, and as needed.   Medication Therapy: Macrobid 100 mg BID # 14   She will be called with culture results   Lab:TOC if Urine Culture is positive        RV

## 2014-08-24 NOTE — Telephone Encounter (Signed)
Spoke with patient. Patient states "I have my usual UTI symptoms again." States that she has had burning with urination for several weeks. Patient has been taking AZO with little relief. Denies fevers and back pain. "It may be E.Coli. I don't know. This is my typical symptom." Advised patient will need to be seen today for evaluation and proper treatment. Patient is agreeable. Appointment scheduled for today at 12:45pm with Milford Cage, [place]. Patient is agreeable to date and time.  Cc: Dr.Silva  Routing to provider for final review. Patient agreeable to disposition. Will close encounter

## 2014-08-24 NOTE — Telephone Encounter (Signed)
Pt wants to talk with Dr. Elza Rafter nurse no information given.

## 2014-08-24 NOTE — Patient Instructions (Signed)
Urinary Tract Infection Urinary tract infections (UTIs) can develop anywhere along your urinary tract. Your urinary tract is your body's drainage system for removing wastes and extra water. Your urinary tract includes two kidneys, two ureters, a bladder, and a urethra. Your kidneys are a pair of bean-shaped organs. Each kidney is about the size of your fist. They are located below your ribs, one on each side of your spine. CAUSES Infections are caused by microbes, which are microscopic organisms, including fungi, viruses, and bacteria. These organisms are so small that they can only be seen through a microscope. Bacteria are the microbes that most commonly cause UTIs. SYMPTOMS  Symptoms of UTIs may vary by age and gender of the patient and by the location of the infection. Symptoms in young women typically include a frequent and intense urge to urinate and a painful, burning feeling in the bladder or urethra during urination. Older women and men are more likely to be tired, shaky, and weak and have muscle aches and abdominal pain. A fever may mean the infection is in your kidneys. Other symptoms of a kidney infection include pain in your back or sides below the ribs, nausea, and vomiting. DIAGNOSIS To diagnose a UTI, your caregiver will ask you about your symptoms. Your caregiver also will ask to provide a urine sample. The urine sample will be tested for bacteria and white blood cells. White blood cells are made by your body to help fight infection. TREATMENT  Typically, UTIs can be treated with medication. Because most UTIs are caused by a bacterial infection, they usually can be treated with the use of antibiotics. The choice of antibiotic and length of treatment depend on your symptoms and the type of bacteria causing your infection. HOME CARE INSTRUCTIONS  If you were prescribed antibiotics, take them exactly as your caregiver instructs you. Finish the medication even if you feel better after you  have only taken some of the medication.  Drink enough water and fluids to keep your urine clear or pale yellow.  Avoid caffeine, tea, and carbonated beverages. They tend to irritate your bladder.  Empty your bladder often. Avoid holding urine for long periods of time.  Empty your bladder before and after sexual intercourse.  After a bowel movement, women should cleanse from front to back. Use each tissue only once. SEEK MEDICAL CARE IF:   You have back pain.  You develop a fever.  Your symptoms do not begin to resolve within 3 days. SEEK IMMEDIATE MEDICAL CARE IF:   You have severe back pain or lower abdominal pain.  You develop chills.  You have nausea or vomiting.  You have continued burning or discomfort with urination. MAKE SURE YOU:   Understand these instructions.  Will watch your condition.  Will get help right away if you are not doing well or get worse. Document Released: 07/19/2005 Document Revised: 04/09/2012 Document Reviewed: 11/17/2011 [facility] Patient Information 2015 [place], [place]. This information is not intended to replace advice given to you by your health care provider. Make sure you discuss any questions you have with your health care provider.   Extra virgin olive oil Pharmceutical grade coconut oil

## 2014-08-25 ENCOUNTER — Telehealth: Payer: Self-pay | Admitting: Nurse Practitioner

## 2014-08-25 LAB — URINALYSIS, MICROSCOPIC ONLY
Casts: NONE SEEN
Crystals: NONE SEEN
SQUAMOUS EPITHELIAL / LPF: NONE SEEN

## 2014-08-25 NOTE — Telephone Encounter (Signed)
Patient waiting for results. She says someone called her but did not leave a message.

## 2014-08-25 NOTE — Telephone Encounter (Signed)
Spoke with patient. Advised patient urine culture results are not back yet. Advised someone will be in contact with her as soon as those results are in. Advised to continue with Macrobid at this time until results are back. Patient is agreeable and verbalizes understanding.  Routing to provider for final review. Patient agreeable to disposition. Will close encounter ;

## 2014-08-25 NOTE — Telephone Encounter (Signed)
Pt says someone has tried to call her but no message in system.

## 2014-08-26 NOTE — Progress Notes (Signed)
Encounter reviewed by Dr. Ervin Rothbauer Silva.  

## 2014-08-27 LAB — URINE CULTURE: Colony Count: >=100000

## 2014-09-01 NOTE — Addendum Note (Signed)
Addended by: Abelino Derrick C on: 09/01/2014 04:15 PM   Modules accepted: Orders, SmartSet

## 2014-09-15 ENCOUNTER — Ambulatory Visit (INDEPENDENT_AMBULATORY_CARE_PROVIDER_SITE_OTHER): Payer: Medicare Other

## 2014-09-15 DIAGNOSIS — N390 Urinary tract infection, site not specified: Secondary | ICD-10-CM

## 2014-09-15 DIAGNOSIS — R319 Hematuria, unspecified: Secondary | ICD-10-CM

## 2014-09-15 NOTE — Progress Notes (Signed)
Pt came in for a nurse visit to give a urine TOC Pt states no sx and has finished antibiotics.  Culture sent to lab

## 2014-09-16 LAB — URINE CULTURE
COLONY COUNT: NO GROWTH
Organism ID, Bacteria: NO GROWTH

## 2015-01-19 ENCOUNTER — Other Ambulatory Visit: Payer: Self-pay | Admitting: Cardiovascular Disease

## 2015-01-21 ENCOUNTER — Ambulatory Visit (INDEPENDENT_AMBULATORY_CARE_PROVIDER_SITE_OTHER): Payer: Medicare Other | Admitting: Obstetrics and Gynecology

## 2015-01-21 ENCOUNTER — Ambulatory Visit (INDEPENDENT_AMBULATORY_CARE_PROVIDER_SITE_OTHER): Payer: Medicare Other

## 2015-01-21 ENCOUNTER — Encounter: Payer: Self-pay | Admitting: Obstetrics and Gynecology

## 2015-01-21 ENCOUNTER — Telehealth: Payer: Self-pay | Admitting: Obstetrics and Gynecology

## 2015-01-21 ENCOUNTER — Other Ambulatory Visit: Payer: Self-pay | Admitting: Obstetrics and Gynecology

## 2015-01-21 VITALS — BP 142/80 | HR 50 | Ht 62.75 in | Wt 108.2 lb

## 2015-01-21 DIAGNOSIS — R109 Unspecified abdominal pain: Secondary | ICD-10-CM

## 2015-01-21 DIAGNOSIS — R1032 Left lower quadrant pain: Secondary | ICD-10-CM | POA: Diagnosis not present

## 2015-01-21 LAB — POCT URINALYSIS DIPSTICK
BILIRUBIN UA: NEGATIVE
Glucose, UA: NEGATIVE
KETONES UA: NEGATIVE
Leukocytes, UA: NEGATIVE
Nitrite, UA: NEGATIVE
PH UA: 5
Protein, UA: NEGATIVE
RBC UA: NEGATIVE
Urobilinogen, UA: NEGATIVE

## 2015-01-21 NOTE — Progress Notes (Signed)
Patient ID: Bailey [name], female   DOB: [birth year], 74 y.o.   MRN: [id number] GYNECOLOGY VISIT  PCP:  Crist Infante, MD  Referring provider:   HPI: 74 y.o.   Married  Caucasian  female   G2P2002 with Patient's last menstrual period was 06/23/2010.   here for LLQ pain x3 days. No fever, no N & V, no change in bowel habits and no urinary symptoms  Pain off and one.  Radiating down her left flank and into her hip. Has increased her weight lifting.   Nothing makes the pain better or worse.  Pain does wax and wane.   No vaginal bleeding.   History of colitis - in stomach and intestines.  Has diarrhea every day which is very watery.  Takes Imodium and this helps.  No blood in stool or black tarry stools. Some nausea that comes and goes.  No vomiting.   No pain with urination.  Has frequent UTIs.  Urine dip - negative.    Hgb:   Urine:    GYNECOLOGIC HISTORY: Patient's last menstrual period was 06/23/2010. Sexually active:  no Partner preference: female Contraception:  postmenopausal  Menopausal hormone therapy: none DES exposure:   no Blood transfusions: no  Sexually transmitted diseases:  no GYN procedures and prior surgeries: Tubal ligation, D & C, D & C/hysteroscopy/polypectomy  Last mammogram:  05-20-14 heterogeneously dense/nl:[facility]               Last pap and high risk HPV testing:  06-22-14 wnl:no HR HPV testing History of abnormal pap smear: no    OB History    Gravida Para Term Preterm AB TAB SAB Ectopic Multiple Living   2 2 2       2        Past Medical History  Diagnosis Date  . Hypertension   . Hyperlipidemia   . Aortic insufficiency   . Vertigo   . Bicuspid aortic valve   . Colitis, collagenous summer of 2013  . Pneumonia ~ 1944; ~ 2009  . GERD (gastroesophageal reflux disease)     "/endoscopy; doesn't bother me at all" (01/06/2013)  . Migraine     "used to have once/month; none in years" (01/06/2013)  . Arthritis     "hands"  (01/06/2013)  . Hard of hearing     bilat  . DDD (degenerative disc disease) 12/2009  . Anxiety   . Bicuspid aortic valve     Past Surgical History  Procedure Laterality Date  . Tonsillectomy      "when I was little" (01/06/2013)  . Tubal ligation Bilateral 1970's  . Tumor removal  1990's & 06/23/2012, 02/2014    "benign; roof of mouth" (01/06/2013)  . Thyroidectomy, partial      "removed gland from left side of neck that was to go down and past thyroid in front" (01/06/2013)  . Colonoscopy N/A 01/07/2013    Procedure: COLONOSCOPY;  Surgeon: Cleotis Nipper, MD;  Location: [facility] ENDOSCOPY;  Service: Endoscopy;  Laterality: N/A;  The colonoscopy will be unprepped  . Esophagogastroduodenoscopy N/A 01/07/2013    Procedure: ESOPHAGOGASTRODUODENOSCOPY (EGD);  Surgeon: Cleotis Nipper, MD;  Location: [facility] ENDOSCOPY;  Service: Endoscopy;  Laterality: N/A;  . Dilation and curettage of uterus  2002    w/hysteroscopy  . Hysteroscopy  2002    D&C(sub fibroid)   . Hysteroscopy  2006    resection polyps D&C  . Cataract extraction extracapsular Bilateral 2015    Current Outpatient Prescriptions  Medication Sig Dispense Refill  . atorvastatin (LIPITOR) 20 MG tablet Take 1/2 daily    . BIOTIN 5000 PO Take 5,000 mcg by mouth daily.    . Cholecalciferol (VITAMIN D-3 PO) Take 2,000 Units by mouth daily.    Marland Kitchen escitalopram (LEXAPRO) 10 MG tablet Take 5 mg by mouth daily.    . fluticasone (FLONASE) 50 MCG/ACT nasal spray     . hydrochlorothiazide (HYDRODIURIL) 25 MG tablet TAKE 1 TABLET BY MOUTH ONCE DAILY 90 tablet 1  . Multiple Vitamin (MULITIVITAMIN WITH MINERALS) TABS Take 1 tablet by mouth daily.    . nebivolol (BYSTOLIC) 10 MG tablet Take 10 mg by mouth daily.    . potassium chloride (K-DUR) 10 MEQ tablet Take 1 tablet (10 mEq total) by mouth daily. 30 tablet 0  . Probiotic Product (PROBIOTIC DAILY PO) Take 1 capsule by mouth daily.    Marland Kitchen tretinoin (RETIN-A) 0.05 % cream     .  triamterene-hydrochlorothiazide (MAXZIDE-25) 37.5-25 MG per tablet      No current facility-administered medications for this visit.     ALLERGIES: Advil; Ibuprofen; Metronidazole; and Ondansetron  Family History  Problem Relation Age of Onset  . Arrhythmia Mother   . Osteoporosis Mother   . Cancer Mother     colon  . Diabetes Mother   . Hypertension Mother   . Bone cancer Father   . Hypertension Father   . Thyroid disease Father     hypo  . Brain cancer Brother     History   Social History  . Marital Status: Married    Spouse Name: N/A  . Number of Children: N/A  . Years of Education: N/A   Occupational History  . Not on file.   Social History Main Topics  . Smoking status: Never Smoker   . Smokeless tobacco: Never Used  . Alcohol Use: No  . Drug Use: No  . Sexual Activity:    Partners: Male    Birth Control/ Protection: Post-menopausal   Other Topics Concern  . Not on file   Social History Narrative    ROS:  Pertinent items are noted in HPI.  PHYSICAL EXAMINATION:    Ht 5' 2.75" (1.594 m)  LMP 06/23/2010   Wt Readings from Last 3 Encounters:  09/15/14 112 lb (50.803 kg)  08/24/14 112 lb (50.803 kg)  06/22/14 110 lb 6.4 oz (50.077 kg)     Ht Readings from Last 3 Encounters:  01/21/15 5' 2.75" (1.594 m)  08/24/14 5' 2.75" (1.594 m)  06/22/14 5' 2.75" (1.594 m)    General appearance: alert, cooperative and appears stated age Head: Normocephalic, without obvious abnormality, atraumati Lungs: clear to auscultation bilaterally Heart: regular rate and rhythm Abdomen: soft, non-tender; no masses,  no organomegaly Back - No CVA tenderness.  No abnormal inguinal nodes palpated Neurologic: Grossly normal  Pelvic: External genitalia:  no lesions              Urethra:  normal appearing urethra with no masses, tenderness or lesions              Bartholins and Skenes: normal                 Vagina: normal appearing vagina with normal color and  discharge, no lesions              Cervix: normal appearance                 Bimanual Exam:  Uterus:  uterus is normal size, shape, consistency and nontender.  Mild amount of blood on my glove after bimanual exam is done.                                       Adnexa: normal adnexa in size, nontender and no masses                                     ASSESSMENT  LLQ pain.  History of colitis.  Negative urine dip.   PLAN  Discussed possible etiologies of the pain - GI, GYN, urologic, musculoskeletal.  Will proceed with pelvic ultrasound now to determine if the source of the pain is gynecologic or renal.  Patient agrees to this.   Addendum   Pelvic and renal ultrasound images and report reviewed with patient.   Bilateral kidneys normal. Uterus normal and without masses.  EMS 1.74 mm. Bilateral ovaries normal.  Free fluid 15 x 27 mm.   Normal ultrasound discussed with patient.  I recommend that she contact her PCP or GI if the pain returns.   25 minutes face to face time of which over 50% was spent in counseling.   An After Visit Summary was printed and given to the patient.

## 2015-01-21 NOTE — Telephone Encounter (Signed)
Patient is calling with "a little problem" no further details given. Last seen 09/15/14.

## 2015-01-21 NOTE — Telephone Encounter (Signed)
Spoke with patient. She states for two days she has had lower pelvic pain that radiates to her left lower flank. States that it is worsening over time but pain is intermittent. Has not tried any OTC relief, but states that she has been taking azo for a few weeks and recently stopped. Hx UTI 08/2014 with treatment. No fevers, chills, nausea or vomiting.   Patient also with hx of Colitis and states that she has "constant diarrhea" and reports no change with this.  Denies dysuria or vaginal symptoms.  Patient requesting advised. Advised evaluation with PCP or Dr. Quincy Simmonds, however, if noted to be GI issue patient will be referred back to Dr. Joylene Draft or GI for evaluation. Patient is agreeable to this and would like evaluation by Dr. Quincy Simmonds first.  Scheduled office visit with Dr. Quincy Simmonds today at 1500 and she is agreeable.  Routing to provider for final review. Patient agreeable to disposition. Will close encounter

## 2015-01-21 NOTE — Progress Notes (Signed)
Please refer to separate note for this same day that summaries and discusses this ultrasound report.

## 2015-04-20 ENCOUNTER — Other Ambulatory Visit: Payer: Self-pay

## 2015-04-20 DIAGNOSIS — Z1231 Encounter for screening mammogram for malignant neoplasm of breast: Secondary | ICD-10-CM

## 2015-04-21 ENCOUNTER — Encounter: Payer: Self-pay | Admitting: Cardiovascular Disease

## 2015-05-04 NOTE — Progress Notes (Signed)
Bailey [name] Date of Birth  1941/02/25       [facility]    [employer] [address], [place], [place]  [address]   [place], [place]  [address] [phone]     [phone]   Fax  [phone]    Fax [phone]  Problem List: 1. Bicuspid aortic valve / moderate aortic insufficiency 2. Dyslipidemia 3. Hypertension  History of Present Illness:  Bailey [name] is a 74 y.o. female with the above medical problems.  She has had chronic diarrhea and was found to have colitis. She is taking some medications and is getting better.  She is exercising some.  She has been spending a lot of time at the nursing home taking care of her mother. She's not been able to exercise as much she would like.  She's not had any cardiac problems doing her normal daily activities.  May 04, 2014:  Bailey [name] is doing ok.  She has had some fatigue.  Had  colitis.  She had acute renal failure and was very dehydrated.    May 04, 2015: Bailey [name] is doing well .  Still having problems with the colitis.  No CP or dyspnea.     Current Outpatient Prescriptions on File Prior to Visit  Medication Sig Dispense Refill  . atorvastatin (LIPITOR) 20 MG tablet Take by mouth. Take 1/2 daily    . BIOTIN 5000 PO Take 5,000 mcg by mouth daily.    Marland Kitchen escitalopram (LEXAPRO) 10 MG tablet Take 5 mg by mouth daily.    . fluticasone (FLONASE) 50 MCG/ACT nasal spray Place 1 spray into the nose daily.     . hydrochlorothiazide (HYDRODIURIL) 25 MG tablet TAKE 1 TABLET BY MOUTH ONCE DAILY 90 tablet 1  . Multiple Vitamin (MULITIVITAMIN WITH MINERALS) TABS Take 1 tablet by mouth daily.    . nebivolol (BYSTOLIC) 10 MG tablet Take 10 mg by mouth daily.    . potassium chloride (K-DUR) 10 MEQ tablet Take 1 tablet (10 mEq total) by mouth daily. 30 tablet 0  . tretinoin (RETIN-A) 0.05 % cream Apply topically at bedtime.     . triamterene-hydrochlorothiazide (MAXZIDE-25) 37.5-25 MG per tablet  Take 0.5 tablets by mouth daily.      No current facility-administered medications on file prior to visit.    Allergies  Allergen Reactions  . Advil [Ibuprofen]     vomiting  . Ibuprofen Nausea And Vomiting    vertigo  . Metronidazole Diarrhea and Nausea And Vomiting  . Ondansetron Nausea And Vomiting    Past Medical History  Diagnosis Date  . Hypertension   . Hyperlipidemia   . Aortic insufficiency   . Vertigo   . Bicuspid aortic valve   . Colitis, collagenous summer of 2013  . Pneumonia ~ 1944; ~ 2009  . GERD (gastroesophageal reflux disease)     "/endoscopy; doesn't bother me at all" (01/06/2013)  . Migraine     "used to have once/month; none in years" (01/06/2013)  . Arthritis     "hands" (01/06/2013)  . Hard of hearing     bilat  . DDD (degenerative disc disease) 12/2009  . Anxiety   . Bicuspid aortic valve     Past Surgical History  Procedure Laterality Date  . Tonsillectomy      "when I was little" (01/06/2013)  . Tubal ligation Bilateral 1970's  . Tumor removal  1990's & 06/23/2012, 02/2014    "benign; roof of  mouth" (01/06/2013)  . Thyroidectomy, partial      "removed gland from left side of neck that was to go down and past thyroid in front" (01/06/2013)  . Colonoscopy N/A 01/07/2013    Procedure: COLONOSCOPY;  Surgeon: Cleotis Nipper, MD;  [facility] ENDOSCOPY;  Service: Endoscopy;  Laterality: N/A;  The colonoscopy will be unprepped  . Esophagogastroduodenoscopy N/A 01/07/2013    Procedure: ESOPHAGOGASTRODUODENOSCOPY (EGD);  Surgeon: Cleotis Nipper, MD;  Location: [facility] ENDOSCOPY;  Service: Endoscopy;  Laterality: N/A;  . Dilation and curettage of uterus  2002    w/hysteroscopy  . Hysteroscopy  2002    D&C(sub fibroid)   . Hysteroscopy  2006    resection polyps D&C  . Cataract extraction extracapsular Bilateral 2015    History  Smoking status  . Never Smoker   Smokeless tobacco  . Never Used    History  Alcohol Use No    Family History    Problem Relation Age of Onset  . Arrhythmia Mother   . Osteoporosis Mother   . Cancer Mother     colon  . Diabetes Mother   . Hypertension Mother   . Bone cancer Father   . Hypertension Father   . Thyroid disease Father     hypo  . Brain cancer Brother     Reviw of Systems:  Reviewed in the HPI.  All other systems are negative.  Physical Exam: Blood pressure 130/70, pulse 56, height 5' 3.5" (1.613 m), weight 47.628 kg (105 lb), last menstrual period 06/23/2010. General: Well developed, well nourished, in no acute distress.  Head: Normocephalic, atraumatic, sclera non-icteric, mucus membranes are moist,   Neck: Supple. Carotids are 2 + without bruits. No JVD  Lungs: Clear bilaterally to auscultation.  Heart: regular rate.  normal  S1 S2.  There is a 6-9/6 systolic murmur at the LSB.  Abdomen: Soft, non-tender, non-distended with normal bowel sounds. No hepatomegaly. No rebound/guarding. Her abdominal aorta is palpable in her midline.  Msk:  Strength and tone are normal  Extremities: No clubbing or cyanosis. No edema.  Distal pedal pulses are 2+ and equal bilaterally.  Neuro: Alert and oriented X 3. Moves all extremities spontaneously.  Psych:  Responds to questions appropriately with a normal affect.  ECG: May 05, 2015:  [employer] RBBB. [facility]   Assessment / Plan:   1. Bicuspid aortic valve / moderate aortic insufficiency - exam sounds stable  Will continue to follow yearly   2. Dyslipidemia - followed by Dr. Joylene Draft   3. Hypertension - stable , continue current meds     Nahser, Wonda Cheng, MD  05/05/2015 4:03 PM    [facility] [place],  [place], [place]  [address] Pager [phone] Phone: [phone]; Fax: [phone]   [facility]  [address] [place], [place]  [address] [phone]    Fax [phone]

## 2015-05-05 ENCOUNTER — Ambulatory Visit (INDEPENDENT_AMBULATORY_CARE_PROVIDER_SITE_OTHER): Payer: Medicare Other | Admitting: Cardiovascular Disease

## 2015-05-05 ENCOUNTER — Encounter: Payer: Self-pay | Admitting: Cardiovascular Disease

## 2015-05-05 VITALS — BP 130/70 | HR 56 | Ht 63.5 in | Wt 105.0 lb

## 2015-05-05 DIAGNOSIS — I351 Nonrheumatic aortic (valve) insufficiency: Secondary | ICD-10-CM

## 2015-05-05 DIAGNOSIS — E785 Hyperlipidemia, unspecified: Secondary | ICD-10-CM

## 2015-05-05 DIAGNOSIS — Q231 Congenital insufficiency of aortic valve: Secondary | ICD-10-CM

## 2015-05-05 NOTE — Patient Instructions (Signed)
Medication Instructions:  Your physician recommends that you continue on your current medications as directed. Please refer to the Current Medication list given to you today.   Labwork: None Ordered   Testing/Procedures: None Ordered   Follow-Up: Your physician wants you to follow-up in: 1 year with Dr. Nahser.  You will receive a reminder letter in the mail two months in advance. If you don't receive a letter, please call our office to schedule the follow-up appointment.      

## 2015-05-24 ENCOUNTER — Ambulatory Visit
Admission: RE | Admit: 2015-05-24 | Discharge: 2015-05-24 | Disposition: A | Discharge status: Home / Self Care | Payer: Medicare Other | Source: Ambulatory Visit

## 2015-05-24 DIAGNOSIS — Z1231 Encounter for screening mammogram for malignant neoplasm of breast: Secondary | ICD-10-CM

## 2015-07-05 ENCOUNTER — Other Ambulatory Visit: Payer: Self-pay | Admitting: Cardiovascular Disease

## 2015-07-16 ENCOUNTER — Encounter: Payer: Self-pay | Admitting: Obstetrics and Gynecology

## 2015-07-16 ENCOUNTER — Ambulatory Visit (INDEPENDENT_AMBULATORY_CARE_PROVIDER_SITE_OTHER): Payer: Medicare Other | Admitting: Obstetrics and Gynecology

## 2015-07-16 VITALS — BP 100/80 | HR 60 | Resp 20 | Ht 62.75 in | Wt 109.4 lb

## 2015-07-16 DIAGNOSIS — Z01419 Encounter for gynecological examination (general) (routine) without abnormal findings: Secondary | ICD-10-CM | POA: Diagnosis not present

## 2015-07-16 DIAGNOSIS — N952 Postmenopausal atrophic vaginitis: Secondary | ICD-10-CM | POA: Diagnosis not present

## 2015-07-16 DIAGNOSIS — Z124 Encounter for screening for malignant neoplasm of cervix: Secondary | ICD-10-CM

## 2015-07-16 DIAGNOSIS — R3989 Other symptoms and signs involving the genitourinary system: Secondary | ICD-10-CM

## 2015-07-16 MED ORDER — ESTROGENS, CONJUGATED 0.625 MG/GM VA CREA: TOPICAL_CREAM | VAGINAL | Status: DC

## 2015-07-16 NOTE — Progress Notes (Signed)
Patient ID: Bailey [name], female   DOB: 11-04-1940, 74 y.o.   MRN: [id number] 74 y.o. G35P2002 Married Caucasian female here for annual exam.    Was taking Plaquinil for alopecia.  Stopped due to concern about potential effect on eyes.   On a drug holiday from [place] for osteopenia.  Has some slight burning with urination.  Comes and goes.  Takes AZO, and this helps. Not using any hormone therapy.   Sexual activity uncomfortable for a while.  PCP: Crist Infante, MD  Patient's last menstrual period was 06/23/2010 (approximate).          Sexually active: Yes.   female partner The current method of family planning is post menopausal status.    Exercising: No.   Smoker:  no  Health Maintenance: Pap:  06-22-14 Negative History of abnormal Pap:  no MMG:  05-24-15 3D Density Cat.C/Neg/BiRads 1:[facility]. Colonoscopy:  01-07-13 history of colitis with Dr. Cristina Gong.  Next due 12/2017. BMD:   05/2014  Result  Osteopenia:Dr. Joylene Draft.  TDaP:  PCP Screening Labs:  Hb today: PCP, Urine today: PCP   reports that she has never smoked. She has never used smokeless tobacco. She reports that she does not drink alcohol or use illicit drugs.  Past Medical History  Diagnosis Date  . Hypertension   . Hyperlipidemia   . Aortic insufficiency   . Vertigo   . Bicuspid aortic valve   . Colitis, collagenous summer of 2013  . Pneumonia ~ 1944; ~ 2009  . GERD (gastroesophageal reflux disease)     "/endoscopy; doesn't bother me at all" (01/06/2013)  . Migraine     "used to have once/month; none in years" (01/06/2013)  . Arthritis     "hands" (01/06/2013)  . Hard of hearing     bilat  . DDD (degenerative disc disease) 12/2009  . Anxiety   . Bicuspid aortic valve     Past Surgical History  Procedure Laterality Date  . Tonsillectomy      "when I was little" (01/06/2013)  . Tubal ligation Bilateral 1970's  . Tumor removal  1990's & 06/23/2012, 02/2014    "benign; roof of mouth" (01/06/2013)  .  Thyroidectomy, partial      "removed gland from left side of neck that was to go down and past thyroid in front" (01/06/2013)  . Colonoscopy N/A 01/07/2013    Procedure: COLONOSCOPY;  Surgeon: Cleotis Nipper, MD;  Location: [facility] ENDOSCOPY;  Service: Endoscopy;  Laterality: N/A;  The colonoscopy will be unprepped  . Esophagogastroduodenoscopy N/A 01/07/2013    Procedure: ESOPHAGOGASTRODUODENOSCOPY (EGD);  Surgeon: Cleotis Nipper, MD;  Location[facility];  Service: Endoscopy;  Laterality: N/A;  . Dilation and curettage of uterus  2002    w/hysteroscopy  . Hysteroscopy  2002    D&C(sub fibroid)   . Hysteroscopy  2006    resection polyps D&C  . Cataract extraction extracapsular Bilateral 2015    Current Outpatient Prescriptions  Medication Sig Dispense Refill  . amoxicillin (AMOXIL) 500 MG capsule Take 1 capsule by mouth daily.  0  . atorvastatin (LIPITOR) 10 MG tablet Take 1 tablet by mouth daily.  1  . BIOTIN 5000 PO Take 5,000 mcg by mouth daily.    Marland Kitchen escitalopram (LEXAPRO) 10 MG tablet Take 5 mg by mouth daily.    . fluticasone (FLONASE) 50 MCG/ACT nasal spray Place 1 spray into the nose daily.     . hydrochlorothiazide (HYDRODIURIL) 25 MG tablet TAKE 1 TABLET  BY MOUTH ONCE DAILY 90 tablet 3  . metoprolol succinate (TOPROL-XL) 50 MG 24 hr tablet Take 1 tablet by mouth daily.  10  . Multiple Vitamin (MULITIVITAMIN WITH MINERALS) TABS Take 1 tablet by mouth daily.    . nebivolol (BYSTOLIC) 10 MG tablet Take 10 mg by mouth daily.    . potassium chloride (K-DUR) 10 MEQ tablet Take 1 tablet (10 mEq total) by mouth daily. 30 tablet 0  . tretinoin (RETIN-A) 0.05 % cream Apply topically at bedtime.     . triamterene-hydrochlorothiazide (MAXZIDE-25) 37.5-25 MG per tablet Take 0.5 tablets by mouth daily.      No current facility-administered medications for this visit.    Family History  Problem Relation Age of Onset  . Arrhythmia Mother   . Osteoporosis Mother   . Cancer Mother      colon  . Diabetes Mother   . Hypertension Mother   . Bone cancer Father   . Hypertension Father   . Thyroid disease Father     hypo  . Brain cancer Brother     ROS:  Pertinent items are noted in HPI.  Otherwise, a comprehensive ROS was negative.  Exam:   BP 100/80 mmHg  Pulse 60  Resp 20  Ht 5' 2.75" (1.594 m)  Wt 109 lb 6.4 oz (49.624 kg)  BMI 19.53 kg/m2  LMP 06/23/2010 (Approximate)    General appearance: alert, cooperative and appears stated age Head: Normocephalic, without obvious abnormality, atraumatic Neck: no adenopathy, supple, symmetrical, trachea midline and thyroid normal to inspection and palpation Lungs: clear to auscultation bilaterally Breasts: normal appearance, no masses or tenderness, Inspection negative, No nipple retraction or dimpling, No nipple discharge or bleeding, No axillary or supraclavicular adenopathy Heart: regular rate and rhythm Abdomen: soft, non-tender; bowel sounds normal; no masses,  no organomegaly Extremities: extremities normal, atraumatic, no cyanosis or edema Skin: Skin color, texture, turgor normal. No rashes or lesions Lymph nodes: Cervical, supraclavicular, and axillary nodes normal. No abnormal inguinal nodes palpated Neurologic: Grossly normal  Pelvic: External genitalia:  no lesions              Urethra:  normal appearing urethra with no masses, tenderness or lesions              Bartholins and Skenes: normal                 Vagina: normal appearing vagina with normal color and discharge, no lesions              Cervix: no lesions              Pap taken: No. Bimanual Exam:  Uterus:  normal size, contour, position, consistency, mobility, non-tender              Adnexa: normal adnexa and no mass, fullness, tenderness              Rectovaginal: Yes.  .  Confirms.              Anus:  normal sphincter tone, no lesions  Chaperone was present for exam.  Assessment:   Well woman visit with normal exam. Dysuria versus urethral  pain.   Vaginal atrophy.  Osteopenia.  PCP managing.   Plan: Yearly mammogram recommended after age 53.  Recommended self breast exam.  Pap and HR HPV as above. Discussed Calcium, Vitamin D, regular exercise program including cardiovascular and weight bearing exercise. Labs performed.  Urine micro and culture.  No Rx  at this time as dysuria is more chronic in nature. Refills given on medications.  Yes.  .  See orders.  Rx for Premarin vaginal cream.  Discussed using a small amount around urethra as well as in vagina.  Discussed benefits and risks of DVT, PE, MI, stroke, breast cancer.  Follow up annually and prn.     After visit summary provided.

## 2015-07-16 NOTE — Patient Instructions (Signed)
EXERCISE AND DIET:  We recommended that you start or continue a regular exercise program for good health. Regular exercise means any activity that makes your heart beat faster and makes you sweat.  We recommend exercising at least 30 minutes per day at least 3 days a week, preferably 4 or 5.  We also recommend a diet low in fat and sugar.  Inactivity, poor dietary choices and obesity can cause diabetes, heart attack, stroke, and kidney damage, among others.    ALCOHOL AND SMOKING:  Women should limit their alcohol intake to no more than 7 drinks/beers/glasses of wine (combined, not each!) per week. Moderation of alcohol intake to this level decreases your risk of breast cancer and liver damage. And of course, no recreational drugs are part of a healthy lifestyle.  And absolutely no smoking or even second hand smoke. Most people know smoking can cause heart and lung diseases, but did you know it also contributes to weakening of your bones? Aging of your skin?  Yellowing of your teeth and nails?  CALCIUM AND VITAMIN D:  Adequate intake of calcium and Vitamin D are recommended.  The recommendations for exact amounts of these supplements seem to change often, but generally speaking 600 mg of calcium (either carbonate or citrate) and 800 units of Vitamin D per day seems prudent. Certain women may benefit from higher intake of Vitamin D.  If you are among these women, your doctor will have told you during your visit.    PAP SMEARS:  Pap smears, to check for cervical cancer or precancers,  have traditionally been done yearly, although recent scientific advances have shown that most women can have pap smears less often.  However, every woman still should have a physical exam from her gynecologist every year. It will include a breast check, inspection of the vulva and vagina to check for abnormal growths or skin changes, a visual exam of the cervix, and then an exam to evaluate the size and shape of the uterus and  ovaries.  And after 73 years of age, a rectal exam is indicated to check for rectal cancers. We will also provide age appropriate advice regarding health maintenance, like when you should have certain vaccines, screening for sexually transmitted diseases, bone density testing, colonoscopy, mammograms, etc.   MAMMOGRAMS:  All women over 40 years old should have a yearly mammogram. Many facilities now offer a "3D" mammogram, which may cost around $50 extra out of pocket. If possible,  we recommend you accept the option to have the 3D mammogram performed.  It both reduces the number of women who will be called back for extra views which then turn out to be normal, and it is better than the routine mammogram at detecting truly abnormal areas.    COLONOSCOPY:  Colonoscopy to screen for colon cancer is recommended for all women at age 50.  We know, you hate the idea of the prep.  We agree, BUT, having colon cancer and not knowing it is worse!!  Colon cancer so often starts as a polyp that can be seen and removed at colonscopy, which can quite literally save your life!  And if your first colonoscopy is normal and you have no family history of colon cancer, most women don't have to have it again for 10 years.  Once every ten years, you can do something that may end up saving your life, right?  We will be happy to help you get it scheduled when you are ready.    Be sure to check your insurance coverage so you understand how much it will cost.  It may be covered as a preventative service at no cost, but you should check your particular policy.    Conjugated Estrogens vaginal cream What is this medicine? CONJUGATED ESTROGENS (CON ju gate ed ESS troe jenz) are a mixture of female hormones. This cream can help relieve symptoms associated with menopause.like vaginal dryness and irritation. This medicine may be used for other purposes; ask your health care provider or pharmacist if you have questions. COMMON BRAND NAME(S):  Premarin What should I tell my health care provider before I take this medicine? They need to know if you have any of these conditions: -abnormal vaginal bleeding -blood vessel disease or blood clots -breast, cervical, endometrial, or uterine cancer -dementia -diabetes -gallbladder disease -heart disease or recent heart attack -high blood pressure -high cholesterol -high level of calcium in the blood -hysterectomy -kidney disease -liver disease -migraine headaches -protein C deficiency -protein S deficiency -stroke -systemic lupus erythematosus (SLE) -tobacco smoker -an unusual or allergic reaction to estrogens other medicines, foods, dyes, or preservatives -pregnant or trying to get pregnant -breast-feeding How should I use this medicine? This medicine is for use in the vagina only. Do not take by mouth. Follow the directions on the prescription label. Use at bedtime unless otherwise directed by your doctor or health care professional. Use the special applicator supplied with the cream. Wash hands before and after use. Fill the applicator with the cream and remove from the tube. Lie on your back, part and bend your knees. Insert the applicator into the vagina and push the plunger to expel the cream into the vagina. Wash the applicator with warm soapy water and rinse well. Use exactly as directed for the complete length of time prescribed. Do not stop using except on the advice of your doctor or health care professional. Talk to your pediatrician regarding the use of this medicine in children. Special care may be needed. A patient package insert for the product will be given with each prescription and refill. Read this sheet carefully each time. The sheet may change frequently. Overdosage: If you think you have taken too much of this medicine contact a poison control center or emergency room at once. NOTE: This medicine is only for you. Do not share this medicine with others. What if I  miss a dose? If you miss a dose, use it as soon as you can. If it is almost time for your next dose, use only that dose. Do not use double or extra doses. What may interact with this medicine? Do not take this medicine with any of the following medications: -aromatase inhibitors like aminoglutethimide, anastrozole, exemestane, letrozole, testolactone This medicine may also interact with the following medications: -barbiturates used for inducing sleep or treating seizures -carbamazepine -grapefruit juice -medicines for fungal infections like itraconazole and ketoconazole -raloxifene or tamoxifen -rifabutin -rifampin -rifapentine -ritonavir -some antibiotics used to treat infections -St. John's Wort -warfarin This list may not describe all possible interactions. Give your health care provider a list of all the medicines, herbs, non-prescription drugs, or dietary supplements you use. Also tell them if you smoke, drink alcohol, or use illegal drugs. Some items may interact with your medicine. What should I watch for while using this medicine? Visit your health care professional for regular checks on your progress. You will need a regular breast and pelvic exam. You should also discuss the need for regular mammograms with your health   care professional, and follow his or her guidelines. This medicine can make your body retain fluid, making your fingers, hands, or ankles swell. Your blood pressure can go up. Contact your doctor or health care professional if you feel you are retaining fluid. If you have any reason to think you are pregnant; stop taking this medicine at once and contact your doctor or health care professional. Tobacco smoking increases the risk of getting a blood clot or having a stroke, especially if you are more than 73 years old. You are strongly advised not to smoke. If you wear contact lenses and notice visual changes, or if the lenses begin to feel uncomfortable, consult your  eye care specialist. If you are going to have elective surgery, you may need to stop taking this medicine beforehand. Consult your health care professional for advice prior to scheduling the surgery. What side effects may I notice from receiving this medicine? Side effects that you should report to your doctor or health care professional as soon as possible: -allergic reactions like skin rash, itching or hives, swelling of the face, lips, or tongue -breast tissue changes or discharge -changes in vision -chest pain -confusion, trouble speaking or understanding -dark urine -general ill feeling or flu-like symptoms -light-colored stools -nausea, vomiting -pain, swelling, warmth in the leg -right upper belly pain -severe headaches -shortness of breath -sudden numbness or weakness of the face, arm or leg -trouble walking, dizziness, loss of balance or coordination -unusual vaginal bleeding -yellowing of the eyes or skin Side effects that usually do not require medical attention (report to your doctor or health care professional if they continue or are bothersome): -hair loss -increased hunger or thirst -increased urination -symptoms of vaginal infection like itching, irritation or unusual discharge -unusually weak or tired This list may not describe all possible side effects. Call your doctor for medical advice about side effects. You may report side effects to FDA at 1-800-FDA-1088. Where should I keep my medicine? Keep out of the reach of children. Store at room temperature between 15 and 30 degrees C (59 and 86 degrees F). Throw away any unused medicine after the expiration date. NOTE: This sheet is a summary. It may not cover all possible information. If you have questions about this medicine, talk to your doctor, pharmacist, or health care provider.  2015, [employer]. (2011-01-11 09:20:36)   

## 2015-07-17 LAB — URINALYSIS, MICROSCOPIC ONLY
BACTERIA UA: NONE SEEN [HPF]
Casts: NONE SEEN [LPF]
Crystals: NONE SEEN [HPF]
RBC / HPF: NONE SEEN RBC/HPF (ref <=2)
SQUAMOUS EPITHELIAL / LPF: NONE SEEN [HPF] (ref <=5)
Yeast: NONE SEEN [HPF]

## 2015-07-19 LAB — URINE CULTURE: Colony Count: 75000

## 2015-07-20 ENCOUNTER — Telehealth: Payer: Self-pay

## 2015-07-20 MED ORDER — NITROFURANTOIN MONOHYD MACRO 100 MG PO CAPS: 100.0000 mg | ORAL_CAPSULE | Freq: Two times a day (BID) | ORAL | Status: DC

## 2015-07-20 NOTE — Telephone Encounter (Signed)
-----   Message from Nunzio Cobbs, MD sent at 07/19/2015  3:49 PM EDT ----- Please contact patient with UC results showing E Coli UTI.  I am recommending tx with Macrobid 100 mg po bid for 7 days.  Please send to pharmacy of choice.   Thank you.   Cc - Marisa Sprinkles

## 2015-07-20 NOTE — Telephone Encounter (Signed)
Spoke with patient. Advised of results as seen below. Patient is agreeable. Ex for Macrobid 100 mg po bid #[id number] [phone] sent to pharmacy on file. Urine TOC scheduled for 08/03/2015 at 10:30 am. Agreeable to date and time.  Routing to provider for final review. Patient agreeable to disposition. Will close encounter.

## 2015-08-03 ENCOUNTER — Ambulatory Visit (INDEPENDENT_AMBULATORY_CARE_PROVIDER_SITE_OTHER): Payer: Medicare Other

## 2015-08-03 VITALS — BP 142/82 | HR 60 | Resp 16 | Wt 110.0 lb

## 2015-08-03 DIAGNOSIS — N390 Urinary tract infection, site not specified: Secondary | ICD-10-CM

## 2015-08-03 NOTE — Progress Notes (Signed)
Patient here for urine TOC. States is feeling much better and completed antibiotics.//kn

## 2015-08-05 LAB — URINE CULTURE
Colony Count: NO GROWTH
Organism ID, Bacteria: NO GROWTH

## 2016-04-24 DIAGNOSIS — H26491 Other secondary cataract, right eye: Secondary | ICD-10-CM | POA: Diagnosis not present

## 2016-04-24 DIAGNOSIS — Z961 Presence of intraocular lens: Secondary | ICD-10-CM | POA: Diagnosis not present

## 2016-04-24 DIAGNOSIS — H35371 Puckering of macula, right eye: Secondary | ICD-10-CM | POA: Diagnosis not present

## 2016-05-04 ENCOUNTER — Ambulatory Visit (INDEPENDENT_AMBULATORY_CARE_PROVIDER_SITE_OTHER): Payer: Medicare Other | Admitting: Cardiovascular Disease

## 2016-05-04 ENCOUNTER — Encounter (INDEPENDENT_AMBULATORY_CARE_PROVIDER_SITE_OTHER): Payer: Self-pay

## 2016-05-04 ENCOUNTER — Encounter: Payer: Self-pay | Admitting: Cardiovascular Disease

## 2016-05-04 VITALS — BP 124/74 | HR 52 | Ht 62.75 in | Wt 115.4 lb

## 2016-05-04 DIAGNOSIS — E785 Hyperlipidemia, unspecified: Secondary | ICD-10-CM

## 2016-05-04 DIAGNOSIS — Q231 Congenital insufficiency of aortic valve: Secondary | ICD-10-CM | POA: Diagnosis not present

## 2016-05-04 DIAGNOSIS — I351 Nonrheumatic aortic (valve) insufficiency: Secondary | ICD-10-CM

## 2016-05-04 NOTE — Progress Notes (Signed)
Bailey [name] Date of Birth  11-Aug-1941             Problem List: 1. Bicuspid aortic valve / moderate aortic insufficiency 2. Dyslipidemia 3. Hypertension  History of Present Illness:  Bailey [name] is a 75 y.o. female with the above medical problems.  She has had chronic diarrhea and was found to have colitis. She is taking some medications and is getting better.  She is exercising some.  She has been spending a lot of time at the nursing home taking care of her mother. She's not been able to exercise as much she would like.  She's not had any cardiac problems doing her normal daily activities.  May 04, 2014:  Bailey [name] is doing ok.  She has had some fatigue.  Had  colitis.  She had acute renal failure and was very dehydrated.    May 04, 2015: Bailey [name] is doing well .  Still having problems with the colitis.  No CP or dyspnea.   May 04, 2016:  Bailey [name] is dong well  Followed a bicuspid AV with moderate AI .  Has dyslipidemia and  HTN BP is normal today  Some mild , atypical CP     Current Outpatient Prescriptions on File Prior to Visit  Medication Sig Dispense Refill  . amoxicillin (AMOXIL) 500 MG capsule Take 1 capsule by mouth as needed (AS NEEDED FOR DENTAL PROCEDURES).   0  . atorvastatin (LIPITOR) 10 MG tablet Take 1 tablet by mouth daily.  1  . BIOTIN 5000 PO Take 5,000 mcg by mouth daily.    Marland Kitchen escitalopram (LEXAPRO) 10 MG tablet Take 5 mg by mouth daily.    . hydrochlorothiazide (HYDRODIURIL) 25 MG tablet TAKE 1 TABLET BY MOUTH ONCE DAILY 90 tablet 3  . metoprolol succinate (TOPROL-XL) 50 MG 24 hr tablet Take 1 tablet by mouth daily.  10  . Multiple Vitamin (MULITIVITAMIN WITH MINERALS) TABS Take 1 tablet by mouth daily.    . potassium chloride (K-DUR) 10 MEQ tablet Take 1 tablet (10 mEq total) by mouth daily. 30 tablet 0  . triamterene-hydrochlorothiazide (MAXZIDE-25) 37.5-25 MG per tablet Take 0.5 tablets by mouth daily.      No current facility-administered  medications on file prior to visit.    Allergies  Allergen Reactions  . Advil [Ibuprofen]     vomiting  . Ibuprofen Nausea And Vomiting    vertigo  . Metronidazole Diarrhea and Nausea And Vomiting  . Ondansetron Nausea And Vomiting    Past Medical History  Diagnosis Date  . Hypertension   . Hyperlipidemia   . Aortic insufficiency   . Vertigo   . Bicuspid aortic valve   . Colitis, collagenous summer of 2013  . Pneumonia ~ 1944; ~ 2009  . GERD (gastroesophageal reflux disease)     "/endoscopy; doesn't bother me at all" (01/06/2013)  . Migraine     "used to have once/month; none in years" (01/06/2013)  . Arthritis     "hands" (01/06/2013)  . Hard of hearing     bilat  . DDD (degenerative disc disease) 12/2009  . Anxiety   . Bicuspid aortic valve     Past Surgical History  Procedure Laterality Date  . Tonsillectomy      "when I was little" (01/06/2013)  . Tubal ligation Bilateral 1970's  . Tumor removal  1990's & 06/23/2012, 02/2014    "benign; roof of mouth" (01/06/2013)  . Thyroidectomy, partial      "removed gland  from left side of neck that was to go down and past thyroid in front" (01/06/2013)  . Colonoscopy N/A 01/07/2013    Procedure: COLONOSCOPY;  Surgeon: Cleotis Nipper, MD;  Location: [facility] ENDOSCOPY;  Service: Endoscopy;  Laterality: N/A;  The colonoscopy will be unprepped  . Esophagogastroduodenoscopy N/A 01/07/2013    Procedure: ESOPHAGOGASTRODUODENOSCOPY (EGD);  Surgeon: Cleotis Nipper, MD;  Location: [facility] ENDOSCOPY;  Service: Endoscopy;  Laterality: N/A;  . Dilation and curettage of uterus  2002    w/hysteroscopy  . Hysteroscopy  2002    D&C(sub fibroid)   . Hysteroscopy  2006    resection polyps D&C  . Cataract extraction extracapsular Bilateral 2015    History  Smoking status  . Never Smoker   Smokeless tobacco  . Never Used    History  Alcohol Use No    Family History  Problem Relation Age of Onset  . Arrhythmia Mother   . Osteoporosis Mother     . Cancer Mother     colon  . Diabetes Mother   . Hypertension Mother   . Bone cancer Father   . Hypertension Father   . Thyroid disease Father     hypo  . Brain cancer Brother     Reviw of Systems:  Reviewed in the HPI.  All other systems are negative.  Physical Exam: Blood pressure 124/74, pulse 52, height 5' 2.75" (1.594 m), weight 115 lb 6.4 oz (52.345 kg), last menstrual period 06/23/2010. General: Well developed, well nourished, in no acute distress.  Head: Normocephalic, atraumatic, sclera non-icteric, mucus membranes are moist,   Neck: Supple. Carotids are 2 + without bruits. No JVD  Lungs: Clear bilaterally to auscultation.  Heart: regular rate.  normal  S1 S2.  There is a [id number] systolic murmur at the LSB.  Soft diastolic murmur  Abdomen: Soft, non-tender, non-distended with normal bowel sounds. No hepatomegaly. No rebound/guarding. Her abdominal aorta is palpable in her midline.  Msk:  Strength and tone are normal  Extremities: No clubbing or cyanosis. No edema.  Distal pedal pulses are 2+ and equal bilaterally.  Neuro: Alert and oriented X 3. Moves all extremities spontaneously.  Psych:  Responds to questions appropriately with a normal affect.  ECG: May 04, 2016:  Sinus brady at 11.   Inc. RBBB  Assessment / Plan:   1. Bicuspid aortic valve / moderate aortic insufficiency - exam sounds stable  Will continue to follow yearly Will get an echo prior to her next visit .    2. Dyslipidemia - followed by Dr. Joylene Draft   3. Hypertension - stable , continue current meds   Mertie Moores, MD  05/04/2016 2:01 [facility] [place],  [address] [place], [place]  [address] Pager [phone] Phone: [phone]; Fax: [phone]   [facility]  [address] [place], [place]  [address] [phone]    Fax [phone]

## 2016-05-04 NOTE — Patient Instructions (Signed)
Medication Instructions:  Your physician recommends that you continue on your current medications as directed. Please refer to the Current Medication list given to you today.  Labwork: None Ordered   Testing/Procedures: Your physician has requested that you have an echocardiogram in 1 year before your visit with Dr. Acie Fredrickson. Echocardiography is a painless test that uses sound waves to create images of your heart. It provides your doctor with information about the size and shape of your heart and how well your heart's chambers and valves are working. This procedure takes approximately one hour. There are no restrictions for this procedure.   Follow-Up: Your physician wants you to follow-up in: 1 year with Dr. Acie Fredrickson after your echo.  You will receive a reminder letter in the mail two months in advance. If you don't receive a letter, please call our office to schedule the follow-up appointment.   If you need a refill on your cardiac medications before your next appointment, please call your pharmacy.   Thank you for choosing [facility]! Christen Bame, RN [phone]

## 2016-05-04 NOTE — Addendum Note (Signed)
Addended by: Emmaline Life on: 05/04/2016 03:54 PM   Modules accepted: Orders

## 2016-05-19 DIAGNOSIS — L814 Other melanin hyperpigmentation: Secondary | ICD-10-CM | POA: Diagnosis not present

## 2016-05-19 DIAGNOSIS — Z85828 Personal history of other malignant neoplasm of skin: Secondary | ICD-10-CM | POA: Diagnosis not present

## 2016-05-19 DIAGNOSIS — L280 Lichen simplex chronicus: Secondary | ICD-10-CM | POA: Diagnosis not present

## 2016-05-19 DIAGNOSIS — L819 Disorder of pigmentation, unspecified: Secondary | ICD-10-CM | POA: Diagnosis not present

## 2016-05-19 DIAGNOSIS — L661 Lichen planopilaris: Secondary | ICD-10-CM | POA: Diagnosis not present

## 2016-06-09 DIAGNOSIS — R7301 Impaired fasting glucose: Secondary | ICD-10-CM | POA: Diagnosis not present

## 2016-06-09 DIAGNOSIS — N390 Urinary tract infection, site not specified: Secondary | ICD-10-CM | POA: Diagnosis not present

## 2016-06-09 DIAGNOSIS — E784 Other hyperlipidemia: Secondary | ICD-10-CM | POA: Diagnosis not present

## 2016-06-09 DIAGNOSIS — R8299 Other abnormal findings in urine: Secondary | ICD-10-CM | POA: Diagnosis not present

## 2016-06-09 DIAGNOSIS — I10 Essential (primary) hypertension: Secondary | ICD-10-CM | POA: Diagnosis not present

## 2016-06-09 DIAGNOSIS — M859 Disorder of bone density and structure, unspecified: Secondary | ICD-10-CM | POA: Diagnosis not present

## 2016-06-16 DIAGNOSIS — J3089 Other allergic rhinitis: Secondary | ICD-10-CM | POA: Diagnosis not present

## 2016-06-16 DIAGNOSIS — R8299 Other abnormal findings in urine: Secondary | ICD-10-CM | POA: Diagnosis not present

## 2016-06-16 DIAGNOSIS — Q231 Congenital insufficiency of aortic valve: Secondary | ICD-10-CM | POA: Diagnosis not present

## 2016-06-16 DIAGNOSIS — D6489 Other specified anemias: Secondary | ICD-10-CM | POA: Diagnosis not present

## 2016-06-16 DIAGNOSIS — R413 Other amnesia: Secondary | ICD-10-CM | POA: Diagnosis not present

## 2016-06-16 DIAGNOSIS — K5289 Other specified noninfective gastroenteritis and colitis: Secondary | ICD-10-CM | POA: Diagnosis not present

## 2016-06-16 DIAGNOSIS — Z23 Encounter for immunization: Secondary | ICD-10-CM | POA: Diagnosis not present

## 2016-06-16 DIAGNOSIS — G6289 Other specified polyneuropathies: Secondary | ICD-10-CM | POA: Diagnosis not present

## 2016-06-16 DIAGNOSIS — I10 Essential (primary) hypertension: Secondary | ICD-10-CM | POA: Diagnosis not present

## 2016-06-16 DIAGNOSIS — Z0000 Encounter for general adult medical examination without abnormal findings: Secondary | ICD-10-CM | POA: Diagnosis not present

## 2016-06-16 DIAGNOSIS — R7301 Impaired fasting glucose: Secondary | ICD-10-CM | POA: Diagnosis not present

## 2016-06-16 DIAGNOSIS — H9193 Unspecified hearing loss, bilateral: Secondary | ICD-10-CM | POA: Diagnosis not present

## 2016-06-23 DIAGNOSIS — Z1212 Encounter for screening for malignant neoplasm of rectum: Secondary | ICD-10-CM | POA: Diagnosis not present

## 2016-07-05 DIAGNOSIS — M859 Disorder of bone density and structure, unspecified: Secondary | ICD-10-CM | POA: Diagnosis not present

## 2016-07-06 ENCOUNTER — Other Ambulatory Visit: Payer: Self-pay | Admitting: Internal Medicine

## 2016-07-06 ENCOUNTER — Ambulatory Visit
Admission: RE | Admit: 2016-07-06 | Discharge: 2016-07-06 | Disposition: A | Discharge status: Home / Self Care | Payer: Medicare Other | Source: Ambulatory Visit | Attending: Internal Medicine | Admitting: Internal Medicine

## 2016-07-06 DIAGNOSIS — M542 Cervicalgia: Secondary | ICD-10-CM

## 2016-07-06 DIAGNOSIS — M47812 Spondylosis without myelopathy or radiculopathy, cervical region: Secondary | ICD-10-CM | POA: Diagnosis not present

## 2016-07-13 ENCOUNTER — Other Ambulatory Visit: Payer: Self-pay | Admitting: Cardiovascular Disease

## 2016-07-24 NOTE — Progress Notes (Signed)
75 y.o. G31P2002 Married Caucasian female here for annual exam.    [place]elling [place] at [place].   Patient has collagenous colitis.  Takes Imodium when leaves her home, and does dietary control.  Struggles with this.   Has some hearing loss and constant ringing in her ears. She thinks it might be time for hearing aides.  PCP:  Crist Infante, MD   Patient's last menstrual period was 06/23/2010 (approximate).           Sexually active: Yes.   female The current method of family planning is post menopausal status.    Exercising: No.   Smoker:  no  Health Maintenance: Pap:  06-22-14 Negative History of abnormal Pap:  no MMG: 05-24-15 3D/Density C/Neg/biRads1:[facility] Colonoscopy:  01-07-13 history of colitis with Dr. Cristina Gong.  Next due 12/2017.  BMD:   05/2014  Result  Osteopenia with Dr. Joylene Draft.  Thinks she did it this year at [employer] office.  TDaP:  PCP Gardasil:   N/A   Screening Labs:  Hb today: PCP, Urine today: PCP   reports that she has never smoked. She has never used smokeless tobacco. She reports that she does not drink alcohol or use drugs.  Past Medical History:  Diagnosis Date  . Anxiety   . Aortic insufficiency   . Arthritis    "hands" (01/06/2013)  . Bicuspid aortic valve   . Bicuspid aortic valve   . Colitis, collagenous summer of 2013  . DDD (degenerative disc disease) 12/2009  . GERD (gastroesophageal reflux disease)    "/endoscopy; doesn't bother me at all" (01/06/2013)  . Hard of hearing    bilat  . Hyperlipidemia   . Hypertension   . Migraine    "used to have once/month; none in years" (01/06/2013)  . Pneumonia ~ 1944; ~ 2009  . Vertigo     Past Surgical History:  Procedure Laterality Date  . CATARACT EXTRACTION EXTRACAPSULAR Bilateral 2015  . COLONOSCOPY N/A 01/07/2013   Procedure: COLONOSCOPY;  Surgeon: Cleotis Nipper, MD;  Location: [facility] ENDOSCOPY;  Service: Endoscopy;  Laterality: N/A;  The colonoscopy will be unprepped  . DILATION  AND CURETTAGE OF UTERUS  2002   w/hysteroscopy  . ESOPHAGOGASTRODUODENOSCOPY N/A 01/07/2013   Procedure: ESOPHAGOGASTRODUODENOSCOPY (EGD);  Surgeon: Cleotis Nipper, MD;  Location: [facility] ENDOSCOPY;  Service: Endoscopy;  Laterality: N/A;  . HYSTEROSCOPY  2002   D&C(sub fibroid)   . HYSTEROSCOPY  2006   resection polyps D&C  . THYROIDECTOMY, PARTIAL     "removed gland from left side of neck that was to go down and past thyroid in front" (01/06/2013)  . TONSILLECTOMY     "when I was little" (01/06/2013)  . TUBAL LIGATION Bilateral 1970's  . TUMOR REMOVAL  1990's & 06/23/2012, 02/2014   "benign; roof of mouth" (01/06/2013)    Current Outpatient Prescriptions  Medication Sig Dispense Refill  . amoxicillin (AMOXIL) 500 MG capsule Take 1 capsule by mouth as needed (AS NEEDED FOR DENTAL PROCEDURES).   0  . atorvastatin (LIPITOR) 10 MG tablet Take 1 tablet by mouth daily.  1  . BIOTIN 5000 PO Take 5,000 mcg by mouth daily.    Marland Kitchen escitalopram (LEXAPRO) 10 MG tablet Take 5 mg by mouth daily.    . hydrochlorothiazide (HYDRODIURIL) 25 MG tablet TAKE 1 TABLET BY MOUTH ONCE DAILY 90 tablet 3  . metoprolol succinate (TOPROL-XL) 50 MG 24 hr tablet Take 1 tablet by mouth daily.  10  . Multiple  Vitamin (MULITIVITAMIN WITH MINERALS) TABS Take 1 tablet by mouth daily.    . potassium chloride (K-DUR) 10 MEQ tablet Take 1 tablet (10 mEq total) by mouth daily. 30 tablet 0  . triamterene-hydrochlorothiazide (MAXZIDE-25) 37.5-25 MG per tablet Take 0.5 tablets by mouth daily.      No current facility-administered medications for this visit.     Family History  Problem Relation Age of Onset  . Bone cancer Father   . Hypertension Father   . Thyroid disease Father     hypo  . Brain cancer Brother   . Arrhythmia Mother   . Osteoporosis Mother   . Cancer Mother     colon  . Diabetes Mother   . Hypertension Mother     ROS:  Pertinent items are noted in HPI.  Otherwise, a comprehensive ROS was negative.  Exam:    BP 138/70 (BP Location: Right Arm, Patient Position: Sitting, Cuff Size: Normal)   Pulse (!) 56   Resp 14   Ht 5' 2.75" (1.594 m)   Wt 116 lb (52.6 kg)   LMP 06/23/2010 (Approximate)   BMI 20.71 kg/m     General appearance: alert, cooperative and appears stated age Head: Normocephalic, without obvious abnormality, atraumatic Neck: no adenopathy, supple, symmetrical, trachea midline and thyroid normal to inspection and palpation Lungs: clear to auscultation bilaterally Breasts: normal appearance, no masses or tenderness, No nipple retraction or dimpling, No nipple discharge or bleeding, No axillary or supraclavicular adenopathy Heart: regular rate and rhythm Abdomen: soft, non-tender; no masses, no organomegaly Extremities: extremities normal, atraumatic, no cyanosis or edema Skin: Skin color, texture, turgor normal. No rashes or lesions Lymph nodes: Cervical, supraclavicular, and axillary nodes normal. No abnormal inguinal nodes palpated Neurologic: Grossly normal  Pelvic: External genitalia:  no lesions              Urethra:  normal appearing urethra with no masses, tenderness or lesions              Bartholins and Skenes: normal                 Vagina: normal appearing vagina with normal color and discharge, no lesions              Cervix: no lesions              Pap taken: Yes.   Bimanual Exam:  Uterus:  normal size, contour, position, consistency, mobility, non-tender              Adnexa: no mass, fullness, tenderness              Rectal exam: Yes.  .  Confirms.              Anus:  normal sphincter tone, no lesions  Chaperone was present for exam.  Assessment:   Well woman visit with normal exam. Osteopenia.  PCP managing.  Collagenous colitis.  Hearing deficiency.   Plan: Yearly mammogram recommended after age 13.  Patient will schedule.  Recommended self breast exam.  Pap and HR HPV as above. Discussed Calcium, Vitamin D, regular exercise program including  cardiovascular and weight bearing exercise. Follow up with PCP regarding colitis and hearing issues.   Will get copy of recent visit with PCP to understand if she had a bone density or not.  Follow up annually and prn.       After visit summary provided.

## 2016-07-26 ENCOUNTER — Ambulatory Visit (INDEPENDENT_AMBULATORY_CARE_PROVIDER_SITE_OTHER): Payer: Medicare Other | Admitting: Obstetrics and Gynecology

## 2016-07-26 ENCOUNTER — Encounter: Payer: Self-pay | Admitting: Obstetrics and Gynecology

## 2016-07-26 VITALS — BP 138/70 | HR 56 | Resp 14 | Ht 62.75 in | Wt 116.0 lb

## 2016-07-26 DIAGNOSIS — Z124 Encounter for screening for malignant neoplasm of cervix: Secondary | ICD-10-CM

## 2016-07-26 DIAGNOSIS — Z01419 Encounter for gynecological examination (general) (routine) without abnormal findings: Secondary | ICD-10-CM

## 2016-07-26 NOTE — Patient Instructions (Signed)

## 2016-07-28 LAB — IPS PAP SMEAR ONLY

## 2016-08-21 DIAGNOSIS — M542 Cervicalgia: Secondary | ICD-10-CM | POA: Diagnosis not present

## 2016-08-21 DIAGNOSIS — R51 Headache: Secondary | ICD-10-CM | POA: Diagnosis not present

## 2016-08-21 DIAGNOSIS — R413 Other amnesia: Secondary | ICD-10-CM | POA: Diagnosis not present

## 2016-08-21 DIAGNOSIS — Z6820 Body mass index (BMI) 20.0-20.9, adult: Secondary | ICD-10-CM | POA: Diagnosis not present

## 2016-08-21 DIAGNOSIS — F418 Other specified anxiety disorders: Secondary | ICD-10-CM | POA: Diagnosis not present

## 2016-08-21 DIAGNOSIS — H9190 Unspecified hearing loss, unspecified ear: Secondary | ICD-10-CM | POA: Diagnosis not present

## 2016-08-22 ENCOUNTER — Other Ambulatory Visit: Payer: Self-pay | Admitting: Internal Medicine

## 2016-08-22 ENCOUNTER — Other Ambulatory Visit: Payer: Self-pay | Admitting: Registered Nurse

## 2016-08-22 DIAGNOSIS — Z1231 Encounter for screening mammogram for malignant neoplasm of breast: Secondary | ICD-10-CM

## 2016-08-22 DIAGNOSIS — R51 Headache: Secondary | ICD-10-CM

## 2016-08-22 DIAGNOSIS — M542 Cervicalgia: Principal | ICD-10-CM

## 2016-08-22 DIAGNOSIS — G8929 Other chronic pain: Secondary | ICD-10-CM

## 2016-08-22 DIAGNOSIS — R519 Headache, unspecified: Secondary | ICD-10-CM

## 2016-09-20 ENCOUNTER — Ambulatory Visit
Admission: RE | Admit: 2016-09-20 | Discharge: 2016-09-20 | Disposition: A | Discharge status: Home / Self Care | Payer: Medicare Other | Source: Ambulatory Visit | Attending: Internal Medicine | Admitting: Internal Medicine

## 2016-09-20 DIAGNOSIS — Z1231 Encounter for screening mammogram for malignant neoplasm of breast: Secondary | ICD-10-CM | POA: Diagnosis not present

## 2016-10-13 ENCOUNTER — Other Ambulatory Visit: Payer: Medicare Other

## 2016-10-17 ENCOUNTER — Other Ambulatory Visit: Payer: Medicare Other

## 2016-10-17 DIAGNOSIS — R05 Cough: Secondary | ICD-10-CM | POA: Diagnosis not present

## 2016-10-17 DIAGNOSIS — Z6820 Body mass index (BMI) 20.0-20.9, adult: Secondary | ICD-10-CM | POA: Diagnosis not present

## 2016-10-17 DIAGNOSIS — H1132 Conjunctival hemorrhage, left eye: Secondary | ICD-10-CM | POA: Diagnosis not present

## 2016-10-17 DIAGNOSIS — J0100 Acute maxillary sinusitis, unspecified: Secondary | ICD-10-CM | POA: Diagnosis not present

## 2016-11-21 DIAGNOSIS — Z23 Encounter for immunization: Secondary | ICD-10-CM | POA: Diagnosis not present

## 2016-12-04 ENCOUNTER — Ambulatory Visit
Admission: RE | Admit: 2016-12-04 | Discharge: 2016-12-04 | Disposition: A | Discharge status: Home / Self Care | Payer: Medicare Other | Source: Ambulatory Visit | Attending: Registered Nurse | Admitting: Registered Nurse

## 2016-12-04 DIAGNOSIS — R51 Headache: Secondary | ICD-10-CM

## 2016-12-04 DIAGNOSIS — M50222 Other cervical disc displacement at C5-C6 level: Secondary | ICD-10-CM | POA: Diagnosis not present

## 2016-12-04 DIAGNOSIS — R519 Headache, unspecified: Secondary | ICD-10-CM

## 2016-12-04 DIAGNOSIS — M542 Cervicalgia: Principal | ICD-10-CM

## 2016-12-04 DIAGNOSIS — G8929 Other chronic pain: Secondary | ICD-10-CM

## 2016-12-05 ENCOUNTER — Other Ambulatory Visit: Payer: Self-pay | Admitting: Registered Nurse

## 2017-04-10 DIAGNOSIS — R29810 Facial weakness: Secondary | ICD-10-CM | POA: Diagnosis not present

## 2017-04-10 DIAGNOSIS — G629 Polyneuropathy, unspecified: Secondary | ICD-10-CM | POA: Diagnosis not present

## 2017-04-10 DIAGNOSIS — F418 Other specified anxiety disorders: Secondary | ICD-10-CM | POA: Diagnosis not present

## 2017-04-10 DIAGNOSIS — K529 Noninfective gastroenteritis and colitis, unspecified: Secondary | ICD-10-CM | POA: Diagnosis not present

## 2017-04-10 DIAGNOSIS — G6289 Other specified polyneuropathies: Secondary | ICD-10-CM | POA: Diagnosis not present

## 2017-04-30 DIAGNOSIS — H26491 Other secondary cataract, right eye: Secondary | ICD-10-CM | POA: Diagnosis not present

## 2017-04-30 DIAGNOSIS — H35371 Puckering of macula, right eye: Secondary | ICD-10-CM | POA: Diagnosis not present

## 2017-05-08 ENCOUNTER — Other Ambulatory Visit: Payer: Self-pay

## 2017-05-08 ENCOUNTER — Ambulatory Visit (HOSPITAL_COMMUNITY): Payer: Medicare Other | Attending: Cardiology

## 2017-05-08 DIAGNOSIS — Q231 Congenital insufficiency of aortic valve: Secondary | ICD-10-CM | POA: Diagnosis not present

## 2017-05-08 DIAGNOSIS — I351 Nonrheumatic aortic (valve) insufficiency: Secondary | ICD-10-CM | POA: Diagnosis not present

## 2017-05-24 DIAGNOSIS — H26491 Other secondary cataract, right eye: Secondary | ICD-10-CM | POA: Diagnosis not present

## 2017-06-08 ENCOUNTER — Ambulatory Visit: Payer: Medicare Other | Admitting: Cardiovascular Disease

## 2017-06-28 DIAGNOSIS — M542 Cervicalgia: Secondary | ICD-10-CM | POA: Diagnosis not present

## 2017-06-28 DIAGNOSIS — M47892 Other spondylosis, cervical region: Secondary | ICD-10-CM | POA: Diagnosis not present

## 2017-06-28 DIAGNOSIS — Q231 Congenital insufficiency of aortic valve: Secondary | ICD-10-CM | POA: Diagnosis not present

## 2017-06-28 DIAGNOSIS — G6289 Other specified polyneuropathies: Secondary | ICD-10-CM | POA: Diagnosis not present

## 2017-07-06 ENCOUNTER — Encounter: Payer: Self-pay | Admitting: Cardiovascular Disease

## 2017-07-11 DIAGNOSIS — K591 Functional diarrhea: Secondary | ICD-10-CM | POA: Diagnosis not present

## 2017-07-11 DIAGNOSIS — K52831 Collagenous colitis: Secondary | ICD-10-CM | POA: Diagnosis not present

## 2017-07-18 ENCOUNTER — Ambulatory Visit: Payer: Medicare Other | Admitting: Cardiovascular Disease

## 2017-08-01 DIAGNOSIS — R7301 Impaired fasting glucose: Secondary | ICD-10-CM | POA: Diagnosis not present

## 2017-08-01 DIAGNOSIS — E7849 Other hyperlipidemia: Secondary | ICD-10-CM | POA: Diagnosis not present

## 2017-08-01 DIAGNOSIS — I10 Essential (primary) hypertension: Secondary | ICD-10-CM | POA: Diagnosis not present

## 2017-08-02 ENCOUNTER — Ambulatory Visit: Payer: Medicare Other | Admitting: Obstetrics and Gynecology

## 2017-08-02 DIAGNOSIS — M859 Disorder of bone density and structure, unspecified: Secondary | ICD-10-CM | POA: Diagnosis not present

## 2017-08-02 NOTE — Progress Notes (Deleted)
76 y.o. G69P2002 Married Caucasian female here for annual exam.    PCP:     Patient's last menstrual period was 06/23/2010 (approximate).           Sexually active: {yes no:314532}  The current method of family planning is post menopausal status.    Exercising: {yes no:314532}  {types:19826} Smoker:  no  Health Maintenance: Pap: 07-26-16 Neg, 06-22-14 Neg History of abnormal Pap:  no MMG: 11-129-17 Density C/Neg/BiRads1:[facility] Colonoscopy:01-07-13 history of colitis with Dr. Cristina Gong. Next due 12/2017.   BMD: ***  Result  *** TDaP:  PCP Gardasil:   no HIV: *** Hep C: *** Screening Labs:  Hb today: ***, Urine today: ***   reports that she has never smoked. She has never used smokeless tobacco. She reports that she does not drink alcohol or use drugs.  Past Medical History:  Diagnosis Date  . Anxiety   . Aortic insufficiency   . Arthritis    "hands" (01/06/2013)  . Bicuspid aortic valve   . Bicuspid aortic valve   . Colitis, collagenous summer of 2013  . DDD (degenerative disc disease) 12/2009  . GERD (gastroesophageal reflux disease)    "/endoscopy; doesn't bother me at all" (01/06/2013)  . Hard of hearing    bilat  . Hyperlipidemia   . Hypertension   . Migraine    "used to have once/month; none in years" (01/06/2013)  . Pneumonia ~ 1944; ~ 2009  . Vertigo     Past Surgical History:  Procedure Laterality Date  . CATARACT EXTRACTION EXTRACAPSULAR Bilateral 2015  . COLONOSCOPY N/A 01/07/2013   Procedure: COLONOSCOPY;  Surgeon: Cleotis Nipper, MD;  Location: [facility] ENDOSCOPY;  Service: Endoscopy;  Laterality: N/A;  The colonoscopy will be unprepped  . DILATION AND CURETTAGE OF UTERUS  2002   w/hysteroscopy  . ESOPHAGOGASTRODUODENOSCOPY N/A 01/07/2013   Procedure: ESOPHAGOGASTRODUODENOSCOPY (EGD);  Surgeon: Cleotis Nipper, MD;  Location: [facility] ENDOSCOPY;  Service: Endoscopy;  Laterality: N/A;  . HYSTEROSCOPY  2002   D&C(sub fibroid)   . HYSTEROSCOPY  2006   resection polyps D&C   . THYROIDECTOMY, PARTIAL     "removed gland from left side of neck that was to go down and past thyroid in front" (01/06/2013)  . TONSILLECTOMY     "when I was little" (01/06/2013)  . TUBAL LIGATION Bilateral 1970's  . TUMOR REMOVAL  1990's & 06/23/2012, 02/2014   "benign; roof of mouth" (01/06/2013)    Current Outpatient Prescriptions  Medication Sig Dispense Refill  . amoxicillin (AMOXIL) 500 MG capsule Take 1 capsule by mouth as needed (AS NEEDED FOR DENTAL PROCEDURES).   0  . atorvastatin (LIPITOR) 10 MG tablet Take 1 tablet by mouth daily.  1  . BIOTIN 5000 PO Take 5,000 mcg by mouth daily.    Marland Kitchen escitalopram (LEXAPRO) 10 MG tablet Take 5 mg by mouth daily.    . hydrochlorothiazide (HYDRODIURIL) 25 MG tablet TAKE 1 TABLET BY MOUTH ONCE DAILY 90 tablet 3  . metoprolol succinate (TOPROL-XL) 50 MG 24 hr tablet Take 1 tablet by mouth daily.  10  . Multiple Vitamin (MULITIVITAMIN WITH MINERALS) TABS Take 1 tablet by mouth daily.    . potassium chloride (K-DUR) 10 MEQ tablet Take 1 tablet (10 mEq total) by mouth daily. 30 tablet 0  . triamterene-hydrochlorothiazide (MAXZIDE-25) 37.5-25 MG per tablet Take 0.5 tablets by mouth daily.      No current facility-administered medications for this visit.     Family History  Problem Relation  Age of Onset  . Bone cancer Father   . Hypertension Father   . Thyroid disease Father        hypo  . Brain cancer Brother   . Arrhythmia Mother   . Osteoporosis Mother   . Cancer Mother        colon  . Diabetes Mother   . Hypertension Mother     ROS:  Pertinent items are noted in HPI.  Otherwise, a comprehensive ROS was negative.  Exam:   LMP 06/23/2010 (Approximate)     General appearance: alert, cooperative and appears stated age Head: Normocephalic, without obvious abnormality, atraumatic Neck: no adenopathy, supple, symmetrical, trachea midline and thyroid normal to inspection and palpation Lungs: clear to auscultation bilaterally Breasts:  normal appearance, no masses or tenderness, No nipple retraction or dimpling, No nipple discharge or bleeding, No axillary or supraclavicular adenopathy Heart: regular rate and rhythm Abdomen: soft, non-tender; no masses, no organomegaly Extremities: extremities normal, atraumatic, no cyanosis or edema Skin: Skin color, texture, turgor normal. No rashes or lesions Lymph nodes: Cervical, supraclavicular, and axillary nodes normal. No abnormal inguinal nodes palpated Neurologic: Grossly normal  Pelvic: External genitalia:  no lesions              Urethra:  normal appearing urethra with no masses, tenderness or lesions              Bartholins and Skenes: normal                 Vagina: normal appearing vagina with normal color and discharge, no lesions              Cervix: no lesions              Pap taken: {yes no:314532} Bimanual Exam:  Uterus:  normal size, contour, position, consistency, mobility, non-tender              Adnexa: no mass, fullness, tenderness              Rectal exam: {yes no:314532}.  Confirms.              Anus:  normal sphincter tone, no lesions  Chaperone was present for exam.  Assessment:   Well woman visit with normal exam.   Plan: Mammogram screening discussed. Recommended self breast awareness. Pap and HR HPV as above. Guidelines for Calcium, Vitamin D, regular exercise program including cardiovascular and weight bearing exercise.   Follow up annually and prn.   Additional counseling given.  {yes [id number]. _______ minutes face to face time of which over 50% was spent in counseling.    After visit summary provided.

## 2017-08-08 DIAGNOSIS — F418 Other specified anxiety disorders: Secondary | ICD-10-CM | POA: Diagnosis not present

## 2017-08-08 DIAGNOSIS — M542 Cervicalgia: Secondary | ICD-10-CM | POA: Diagnosis not present

## 2017-08-08 DIAGNOSIS — R51 Headache: Secondary | ICD-10-CM | POA: Diagnosis not present

## 2017-08-08 DIAGNOSIS — Z0000 Encounter for general adult medical examination without abnormal findings: Secondary | ICD-10-CM | POA: Diagnosis not present

## 2017-08-08 DIAGNOSIS — K52831 Collagenous colitis: Secondary | ICD-10-CM | POA: Diagnosis not present

## 2017-08-08 DIAGNOSIS — Z23 Encounter for immunization: Secondary | ICD-10-CM | POA: Diagnosis not present

## 2017-08-10 DIAGNOSIS — D692 Other nonthrombocytopenic purpura: Secondary | ICD-10-CM | POA: Diagnosis not present

## 2017-08-10 DIAGNOSIS — C4441 Basal cell carcinoma of skin of scalp and neck: Secondary | ICD-10-CM | POA: Diagnosis not present

## 2017-08-10 DIAGNOSIS — Z85828 Personal history of other malignant neoplasm of skin: Secondary | ICD-10-CM | POA: Diagnosis not present

## 2017-08-10 DIAGNOSIS — D2262 Melanocytic nevi of left upper limb, including shoulder: Secondary | ICD-10-CM | POA: Diagnosis not present

## 2017-08-10 DIAGNOSIS — D2261 Melanocytic nevi of right upper limb, including shoulder: Secondary | ICD-10-CM | POA: Diagnosis not present

## 2017-08-13 DIAGNOSIS — Z1212 Encounter for screening for malignant neoplasm of rectum: Secondary | ICD-10-CM | POA: Diagnosis not present

## 2017-08-15 ENCOUNTER — Ambulatory Visit (INDEPENDENT_AMBULATORY_CARE_PROVIDER_SITE_OTHER): Payer: Medicare Other | Admitting: Neurology

## 2017-08-15 ENCOUNTER — Encounter: Payer: Self-pay | Admitting: Neurology

## 2017-08-15 VITALS — BP 155/79 | HR 65 | Wt 110.0 lb

## 2017-08-15 DIAGNOSIS — R51 Headache: Secondary | ICD-10-CM | POA: Diagnosis not present

## 2017-08-15 DIAGNOSIS — E519 Thiamine deficiency, unspecified: Secondary | ICD-10-CM

## 2017-08-15 DIAGNOSIS — R202 Paresthesia of skin: Secondary | ICD-10-CM | POA: Diagnosis not present

## 2017-08-15 DIAGNOSIS — G629 Polyneuropathy, unspecified: Secondary | ICD-10-CM

## 2017-08-15 DIAGNOSIS — R413 Other amnesia: Secondary | ICD-10-CM

## 2017-08-15 DIAGNOSIS — E531 Pyridoxine deficiency: Secondary | ICD-10-CM

## 2017-08-15 DIAGNOSIS — E538 Deficiency of other specified B group vitamins: Secondary | ICD-10-CM

## 2017-08-15 DIAGNOSIS — G603 Idiopathic progressive neuropathy: Secondary | ICD-10-CM | POA: Diagnosis not present

## 2017-08-15 DIAGNOSIS — R519 Headache, unspecified: Secondary | ICD-10-CM

## 2017-08-15 NOTE — Progress Notes (Signed)
[facility]    Provider:  Dr Lucia Gaskins Referring Provider: Rodrigo Ran, MD Primary Care Physician:  Rodrigo Ran, MD  CC:  Neuropathy  HPI:  Bailey [name] is a 76 y.o. female here as a referral from Dr. Waynard Edwards for neuropathy. Past medical history of bicuspid aortic valve with moderate aortic insufficiency, dyslipidemia, hypertension, neuropathy, osteopenia, decreased hearing, migraines, tinnitus, heart murmur, degenerative disc disease in her cervical spine. Here with her husband who also provides information. Symptoms started 5 years ago. No pain just uncomfortable with the tingling. All day long continuous. Started in the toes and spread up, more in her feet and ankles, no symptoms in the hands. Slowly progressive in the feet. No balance problems, no falls, no significant cramping. She feels stable for the last few years. Doesn't affect sleeping or bother her at night, nothing makes it better or worse, maybe propping her feet up makes it better. Mother had the neuropathy.  No risk factors for toxins or medications that would cause neuropathy.She doesn't take the gabapenti because it doesn't bother her. She has pressure around her head or like she has a tight cap on her head around her hairline, not painful, no pain, no headache, ongoing 5 years. Some memory loss. No vertigo, nausea, double vision. No other focal neurologic deficits, associated symptoms, inciting events or modifiable factors.  Reviewed notes, labs and imaging from outside physicians, which showed:  Reviewed primary care notes. She has pins and needles in the feet from the knees down, tingling, unclear she try gabapentin, she is almost gotten used to the leg and feet symptoms at this point since they're chronic, reviewed exam which was significant only for a systolic ejection murmur otherwise regular rate and rhythm, musculoskeletal normal, neurologic normal including 2+ symmetric reflexes, she has cervical  spondylosis and this is suspicious for causing her symptoms and the symptoms are very similar to 2011 when she saw neurology she's had 2 MRIs of the brain.  Reviewed MRI of the brain and cervical spine images, largely unremarkable except some nonspecific white matter changes, MRI of the cervical spine showed some diffuse degenerative mild changes however the cervical cord without stenosis or changes looks normal.  Review of Systems: Patient complains of symptoms per HPI as well as the following symptoms: decreased energy, memory loss. Pertinent negatives and positives per HPI. All others negative.   Social History   Social History  . Marital status: Married    Spouse name: N/A  . Number of children: N/A  . Years of education: N/A   Occupational History  . Not on file.   Social History Main Topics  . Smoking status: Never Smoker  . Smokeless tobacco: Never Used  . Alcohol use No  . Drug use: No  . Sexual activity: Yes    Partners: Male    Birth control/ protection: Post-menopausal   Other Topics Concern  . Not on file   Social History Narrative  . No narrative on file    Family History  Problem Relation Age of Onset  . Bone cancer Father   . Hypertension Father   . Thyroid disease Father        hypo  . Brain cancer Brother   . Arrhythmia Mother   . Osteoporosis Mother   . Cancer Mother        colon  . Diabetes Mother   . Hypertension Mother     Past Medical History:  Diagnosis Date  . Anxiety   .  Aortic insufficiency   . Arthritis    "hands" (01/06/2013)  . Basal cell carcinoma of skin   . Bicuspid aortic valve   . Bicuspid aortic valve   . Cataract    right  . Colitis, collagenous summer of 2013  . Colon polyp   . DDD (degenerative disc disease) 12/2009  . GERD (gastroesophageal reflux disease)    "/endoscopy; doesn't bother me at all" (01/06/2013)  . Hard of hearing    bilat  . Heart murmur   . Hx of migraines   . Hyperlipidemia   . Hypertension     . Left facial numbness    previous, not current  . Migraine    "used to have once/month; none in years" (01/06/2013)  . Palpitations   . Pneumonia ~ 1944; ~ 2009  . Tinnitus   . Vertigo     Past Surgical History:  Procedure Laterality Date  . CATARACT EXTRACTION EXTRACAPSULAR Bilateral 2015  . COLONOSCOPY N/A 01/07/2013   Procedure: COLONOSCOPY;  Surgeon: Cleotis Nipper, MD;  Location[facility] ENDOSCOPY;  Service: Endoscopy;  Laterality: N/A;  The colonoscopy will be unprepped  . DILATION AND CURETTAGE OF UTERUS  2002   w/hysteroscopy  . ESOPHAGOGASTRODUODENOSCOPY N/A 01/07/2013   Procedure: ESOPHAGOGASTRODUODENOSCOPY (EGD);  Surgeon: Cleotis Nipper, MD;  Location: [facility] ENDOSCOPY;  Service: Endoscopy;  Laterality: N/A;  . HYSTEROSCOPY  2002   D&C(sub fibroid)   . HYSTEROSCOPY  2006   resection polyps D&C  . THYROIDECTOMY, PARTIAL     "removed gland from left side of neck that was to go down and past thyroid in front" (01/06/2013)  . TONSILLECTOMY     "when I was little" (01/06/2013)  . TUBAL LIGATION Bilateral 1970's  . TUMOR REMOVAL  1990's & 06/23/2012, 02/2014   "benign; roof of mouth" (01/06/2013)    Current Outpatient Prescriptions  Medication Sig Dispense Refill  . amoxicillin (AMOXIL) 500 MG capsule Take 1 capsule by mouth as needed (AS NEEDED FOR DENTAL PROCEDURES).   0  . BIOTIN 5000 PO Take 5,000 mcg by mouth daily.    Marland Kitchen donepezil (ARICEPT) 5 MG tablet Take 5 mg by mouth daily.    Marland Kitchen escitalopram (LEXAPRO) 10 MG tablet Take 10 mg by mouth daily.     Marland Kitchen gabapentin (NEURONTIN) 100 MG capsule Take 100 mg by mouth daily.    . hydrochlorothiazide (HYDRODIURIL) 25 MG tablet TAKE 1 TABLET BY MOUTH ONCE DAILY 90 tablet 3  . metoprolol succinate (TOPROL-XL) 50 MG 24 hr tablet Take 1 tablet by mouth daily.  10  . Multiple Vitamin (MULITIVITAMIN WITH MINERALS) TABS Take 1 tablet by mouth daily.    . potassium chloride (K-DUR) 10 MEQ tablet Take 1 tablet (10 mEq total) by mouth daily. 30  tablet 0  . triamterene-hydrochlorothiazide (MAXZIDE-25) 37.5-25 MG per tablet Take 0.5 tablets by mouth daily.      No current facility-administered medications for this visit.     Allergies as of 08/15/2017 - Review Complete 08/15/2017  Allergen Reaction Noted  . Advil [ibuprofen]  06/12/2013  . Ibuprofen Nausea And Vomiting 01/15/2012  . Metronidazole Diarrhea and Nausea And Vomiting 01/01/2013  . Ondansetron Nausea And Vomiting 01/01/2013    Vitals: BP (!) 155/79 Comment: md aware  Pulse 65   Wt 110 lb (49.9 kg)   LMP 06/23/2010 (Approximate)   BMI 19.64 kg/m  Last Weight:  Wt Readings from Last 1 Encounters:  08/15/17 110 lb (49.9 kg)   Last Height:  Ht Readings from Last 1 Encounters:  07/26/16 5' 2.75" (1.594 m)   Physical exam: Exam: Gen: NAD, conversant, well nourised, obese, well groomed                     CV: RRR, +SEM. No Carotid Bruits. No peripheral edema, warm, nontender Eyes: Conjunctivae clear without exudates or hemorrhage  Neuro: Detailed Neurologic Exam  Speech:    Speech is normal; fluent and spontaneous with normal comprehension.  Cognition:    The patient is oriented to person, place, and time;     recent and remote memory intact;     language fluent;     normal attention, concentration,     fund of knowledge Cranial Nerves:    The pupils are equal, round, and reactive to light. The fundi are normal and spontaneous venous pulsations are present. Visual fields are full to finger confrontation. Extraocular movements are intact. Trigeminal sensation is intact and the muscles of mastication are normal. The face is symmetric. The palate elevates in the midline. Hearing intact. Voice is normal. Shoulder shrug is normal. The tongue has normal motion without fasciculations.   Coordination:    Normal finger to nose and heel to shin. Normal rapid alternating movements.   Gait:    Heel-toe and tandem gait are normal.   Motor Observation:    No  asymmetry, no atrophy, and no involuntary movements noted. Tone:    Normal muscle tone.    Posture:    Posture is normal. normal erect    Strength:    Strength is V/V in the upper and lower limbs.      Sensation: intact to LT, proprioception and vibration. Decreased pinprick in a gradient fashion distally to the ankles.     Reflex Exam:  DTR's:    Deep tendon reflexes in the upper and lower extremities are normal bilaterally.   Toes:    The toes are downgoing bilaterally.   Clonus:    Clonus is absent.  Assessment/Plan:  This is a very lovely 76 year old patient here for IV years of paresthesias in the feet, exam consistent with small fiber neuropathy. Also, she also reports new onset head pain that is different than any headache she is experienced, she has not had headaches in years, the head pain is new and needs evaluation.  MRI of the brain with and without contrast due to new onset headache after the age of 43 which is indicated for the evaluation a space-occupying lesion, or other intracranial etiology.  Peripheral neuropathy: Likely small fiber. We'll perform a lab screening. Do not feel an EMG nerve conduction study would give [place] any more information than we are ready know or be significantly helpful in treatment or diagnosis  Orders Placed This Encounter  Procedures  . MR BRAIN W WO CONTRAST  . Hemoglobin A1c  . Methylmalonic acid, serum  . Vitamin B1  . B. burgdorfi Antibody  . TSH  . ANA w/Reflex  . Sjogren's syndrome antibods(ssa + ssb)  . B12 and Folate Panel  . Rheumatoid factor  . Heavy metals, blood  . Multiple Myeloma Panel (SPEP&IFE w/QIG)  . Basic Metabolic Panel     Discussed: To prevent or relieve headaches, try the following: Cool Compress. Lie down and place a cool compress on your head.  Avoid headache triggers. If certain foods or odors seem to have triggered your migraines in the past, avoid them. A headache diary might help you identify  triggers.  Include  physical activity in your daily routine. Try a daily walk or other moderate aerobic exercise.  Manage stress. Find healthy ways to cope with the stressors, such as delegating tasks on your to-do list.  Practice relaxation techniques. Try deep breathing, yoga, massage and visualization.  Eat regularly. Eating regularly scheduled meals and maintaining a healthy diet might help prevent headaches. Also, drink plenty of fluids.  Follow a regular sleep schedule. Sleep deprivation might contribute to headaches Consider biofeedback. With this mind-body technique, you learn to control certain bodily functions - such as muscle tension, heart rate and blood pressure - to prevent headaches or reduce headache pain.    Proceed to emergency room if you experience new or worsening symptoms or symptoms do not resolve, if you have new neurologic symptoms or if headache is severe, or for any concerning symptom.   Provided education and documentation from American headache Society toolbox including articles on: chronic migraine medication overuse headache, chronic migraines, prevention of migraines, behavioral and other nonpharmacologic treatments for headache.   Sarina Ill, MD  [facility] [address] [place], [place] [address]  Phone [phone] Fax [phone]

## 2017-08-15 NOTE — Patient Instructions (Signed)
Peripheral Neuropathy Peripheral neuropathy is a type of nerve damage. It affects nerves that carry signals between the spinal cord and other parts of the body. These are called peripheral nerves. With peripheral neuropathy, one nerve or a group of nerves may be damaged. What are the causes? Many things can damage peripheral nerves. For some people with peripheral neuropathy, the cause is unknown. Some causes include:  Diabetes. This is the most common cause of peripheral neuropathy.  Injury to a nerve.  Pressure or stress on a nerve that lasts a long time.  Too little vitamin B. Alcoholism can lead to this.  Infections.  Autoimmune diseases, such as multiple sclerosis and systemic lupus erythematosus.  Inherited nerve diseases.  Some medicines, such as cancer drugs.  Toxic substances, such as lead and mercury.  Too little blood flowing to the legs.  Kidney disease.  Thyroid disease.  What are the signs or symptoms? Different people have different symptoms. The symptoms you have will depend on which of your nerves is damaged. Common symptoms include:  Loss of feeling (numbness) in the feet and hands.  Tingling in the feet and hands.  Pain that burns.  Very sensitive skin.  Weakness.  Not being able to move a part of the body (paralysis).  Muscle twitching.  Clumsiness or poor coordination.  Loss of balance.  Not being able to control your bladder.  Feeling dizzy.  Sexual problems.  How is this diagnosed? Peripheral neuropathy is a symptom, not a disease. Finding the cause of peripheral neuropathy can be hard. To figure that out, your health care provider will take a medical history and do a physical exam. A neurological exam will also be done. This involves checking things affected by your brain, spinal cord, and nerves (nervous system). For example, your health care provider will check your reflexes, how you move, and what you can feel. Other types of tests  may also be ordered, such as:  Blood tests.  A test of the fluid in your spinal cord.  Imaging tests, such as CT scans or an MRI.  Electromyography (EMG). This test checks the nerves that control muscles.  Nerve conduction velocity tests. These tests check how fast messages pass through your nerves.  Nerve biopsy. A small piece of nerve is removed. It is then checked under a microscope.  How is this treated?  Medicine is often used to treat peripheral neuropathy. Medicines may include: ? Pain-relieving medicines. Prescription or over-the-counter medicine may be suggested. ? Antiseizure medicine. This may be used for pain. ? Antidepressants. These also may help ease pain from neuropathy. ? Lidocaine. This is a numbing medicine. You might wear a patch or be given a shot. ? Mexiletine. This medicine is typically used to help control irregular heart rhythms.  Surgery. Surgery may be needed to relieve pressure on a nerve or to destroy a nerve that is causing pain.  Physical therapy to help movement.  Assistive devices to help movement. Follow these instructions at home:  Only take over-the-counter or prescription medicines as directed by your health care provider. Follow the instructions carefully for any given medicines. Do not take any other medicines without first getting approval from your health care provider.  If you have diabetes, work closely with your health care provider to keep your blood sugar under control.  If you have numbness in your feet: ? Check every day for signs of injury or infection. Watch for redness, warmth, and swelling. ? Wear padded socks and comfortable   shoes. These help protect your feet.  Do not do things that put pressure on your damaged nerve.  Do not smoke. Smoking keeps blood from getting to damaged nerves.  Avoid or limit alcohol. Too much alcohol can cause a lack of B vitamins. These vitamins are needed for healthy nerves.  Develop a good  support system. Coping with peripheral neuropathy can be stressful. Talk to a mental health specialist or join a support group if you are struggling.  Follow up with your health care provider as directed. Contact a health care provider if:  You have new signs or symptoms of peripheral neuropathy.  You are struggling emotionally from dealing with peripheral neuropathy.  You have a fever. Get help right away if:  You have an injury or infection that is not healing.  You feel very dizzy or begin vomiting.  You have chest pain.  You have trouble breathing. This information is not intended to replace advice given to you by your health care provider. Make sure you discuss any questions you have with your health care provider. Document Released: 09/29/2002 Document Revised: 03/16/2016 Document Reviewed: 06/16/2013 Elsevier Interactive Patient Education  2017 Elsevier Inc.  

## 2017-08-16 ENCOUNTER — Other Ambulatory Visit: Payer: Self-pay | Admitting: Internal Medicine

## 2017-08-16 DIAGNOSIS — Z1231 Encounter for screening mammogram for malignant neoplasm of breast: Secondary | ICD-10-CM

## 2017-08-20 LAB — HEAVY METALS, BLOOD
ARSENIC: 8 ug/L (ref 2–23)
LEAD, BLOOD: 1 ug/dL (ref 0–4)
Mercury: NOT DETECTED ug/L (ref 0.0–14.9)

## 2017-08-20 LAB — MULTIPLE MYELOMA PANEL, SERUM
ALPHA2 GLOB SERPL ELPH-MCNC: 0.8 g/dL (ref 0.4–1.0)
Albumin SerPl Elph-Mcnc: 3.8 g/dL (ref 2.9–4.4)
Albumin/Glob SerPl: 1.4 (ref 0.7–1.7)
Alpha 1: 0.3 g/dL (ref 0.0–0.4)
B-Globulin SerPl Elph-Mcnc: 0.9 g/dL (ref 0.7–1.3)
Gamma Glob SerPl Elph-Mcnc: 0.8 g/dL (ref 0.4–1.8)
Globulin, Total: 2.8 g/dL (ref 2.2–3.9)
IGA/IMMUNOGLOBULIN A, SERUM: 95 mg/dL (ref 64–422)
IGM (IMMUNOGLOBULIN M), SRM: 115 mg/dL (ref 26–217)
IgG (Immunoglobin G), Serum: 647 mg/dL — ABNORMAL LOW (ref 700–1600)
TOTAL PROTEIN: 6.6 g/dL (ref 6.0–8.5)

## 2017-08-20 LAB — ENA+DNA/DS+SJORGEN'S: dsDNA Ab: 1 IU/mL (ref 0–9)

## 2017-08-20 LAB — B. BURGDORFI ANTIBODIES

## 2017-08-20 LAB — BASIC METABOLIC PANEL
BUN/Creatinine Ratio: 14 (ref 12–28)
BUN: 15 mg/dL (ref 8–27)
CALCIUM: 10 mg/dL (ref 8.7–10.3)
CO2: 29 mmol/L (ref 20–29)
CREATININE: 1.07 mg/dL — AB (ref 0.57–1.00)
Chloride: 97 mmol/L (ref 96–106)
GFR calc Af Amer: 59 mL/min/{1.73_m2} — ABNORMAL LOW (ref >59)
GFR, EST NON AFRICAN AMERICAN: 51 mL/min/{1.73_m2} — AB (ref >59)
Glucose: 83 mg/dL (ref 65–99)
Potassium: 3.8 mmol/L (ref 3.5–5.2)
SODIUM: 141 mmol/L (ref 134–144)

## 2017-08-20 LAB — METHYLMALONIC ACID, SERUM: METHYLMALONIC ACID: 173 nmol/L (ref 0–378)

## 2017-08-20 LAB — B12 AND FOLATE PANEL
Folate: >20 ng/mL (ref >3.0)
VITAMIN B 12: 922 pg/mL (ref 232–1245)

## 2017-08-20 LAB — ANA W/REFLEX: Anti Nuclear Antibody(ANA): POSITIVE — AB

## 2017-08-20 LAB — TSH: TSH: 1.45 u[IU]/mL (ref 0.450–4.500)

## 2017-08-20 LAB — RHEUMATOID FACTOR: Rhuematoid fact SerPl-aCnc: <10 IU/mL (ref 0.0–13.9)

## 2017-08-20 LAB — VITAMIN B1: Thiamine: 128.5 nmol/L (ref 66.5–200.0)

## 2017-08-20 LAB — HEMOGLOBIN A1C
Est. average glucose Bld gHb Est-mCnc: 100 mg/dL
Hgb A1c MFr Bld: 5.1 % (ref 4.8–5.6)

## 2017-08-20 LAB — SJOGREN'S SYNDROME ANTIBODS(SSA + SSB)
ENA SSA (RO) Ab: <0.2 AI (ref 0.0–0.9)
ENA SSB (LA) Ab: <0.2 AI (ref 0.0–0.9)

## 2017-08-21 ENCOUNTER — Telehealth: Payer: Self-pay | Admitting: *Deleted

## 2017-08-21 NOTE — Telephone Encounter (Signed)
-----   Message from Melvenia Beam, MD sent at 08/21/2017  1:35 PM EDT ----- Labs unremarkable, no cause for neuropathy found thanks

## 2017-08-21 NOTE — Telephone Encounter (Signed)
Called and spoke with pt. She is aware that labs are unremarkable and no cause of neuropathy found. She is aware she has an upcoming MRI scheduled. She had no questions.

## 2017-08-28 ENCOUNTER — Ambulatory Visit
Admission: RE | Admit: 2017-08-28 | Discharge: 2017-08-28 | Disposition: A | Discharge status: Home / Self Care | Payer: Medicare Other | Source: Ambulatory Visit | Attending: Neurology | Admitting: Neurology

## 2017-08-28 DIAGNOSIS — R51 Headache: Secondary | ICD-10-CM

## 2017-08-28 DIAGNOSIS — E531 Pyridoxine deficiency: Secondary | ICD-10-CM

## 2017-08-28 DIAGNOSIS — R202 Paresthesia of skin: Secondary | ICD-10-CM

## 2017-08-28 DIAGNOSIS — R519 Headache, unspecified: Secondary | ICD-10-CM

## 2017-08-28 DIAGNOSIS — G629 Polyneuropathy, unspecified: Secondary | ICD-10-CM | POA: Diagnosis not present

## 2017-08-28 DIAGNOSIS — E519 Thiamine deficiency, unspecified: Secondary | ICD-10-CM

## 2017-08-28 DIAGNOSIS — R413 Other amnesia: Secondary | ICD-10-CM

## 2017-08-28 DIAGNOSIS — E538 Deficiency of other specified B group vitamins: Secondary | ICD-10-CM

## 2017-08-28 MED ORDER — GADOBENATE DIMEGLUMINE 529 MG/ML IV SOLN
9.0000 mL | Freq: Once | INTRAVENOUS | Status: AC | PRN
  Administered 2017-08-28: 9 mL via INTRAVENOUS

## 2017-09-04 DIAGNOSIS — C4441 Basal cell carcinoma of skin of scalp and neck: Secondary | ICD-10-CM | POA: Diagnosis not present

## 2017-09-04 DIAGNOSIS — Z85828 Personal history of other malignant neoplasm of skin: Secondary | ICD-10-CM | POA: Diagnosis not present

## 2017-09-04 DIAGNOSIS — C4449 Other specified malignant neoplasm of skin of scalp and neck: Secondary | ICD-10-CM | POA: Diagnosis not present

## 2017-09-06 ENCOUNTER — Ambulatory Visit: Payer: Medicare Other | Admitting: Cardiovascular Disease

## 2017-09-06 ENCOUNTER — Encounter (INDEPENDENT_AMBULATORY_CARE_PROVIDER_SITE_OTHER): Payer: Self-pay

## 2017-09-06 ENCOUNTER — Encounter: Payer: Self-pay | Admitting: Cardiovascular Disease

## 2017-09-06 VITALS — BP 128/70 | HR 54 | Ht 62.5 in | Wt 112.2 lb

## 2017-09-06 DIAGNOSIS — I351 Nonrheumatic aortic (valve) insufficiency: Secondary | ICD-10-CM

## 2017-09-06 DIAGNOSIS — Q231 Congenital insufficiency of aortic valve: Secondary | ICD-10-CM

## 2017-09-06 NOTE — Progress Notes (Signed)
Bailey [name] Date of Birth  1940/12/01             Problem List: 1. Bicuspid aortic valve / moderate aortic insufficiency 2. Dyslipidemia 3. Hypertension  History of Present Illness:  Bailey [name] is a 76 y.o. female with the above medical problems.  She has had chronic diarrhea and was found to have colitis. She is taking some medications and is getting better.  She is exercising some.  She has been spending a lot of time at the nursing home taking care of her mother. She's not been able to exercise as much she would like.  She's not had any cardiac problems doing her normal daily activities.  May 04, 2014:  Bailey [name] is doing ok.  She has had some fatigue.  Had  colitis.  She had acute renal failure and was very dehydrated.    May 04, 2015: Bailey [name] is doing well .  Still having problems with the colitis.  No CP or dyspnea.   May 04, 2016:  Bailey [name] is dong well  Followed a bicuspid AV with moderate AI .  Has dyslipidemia and  HTN BP is normal today  Some mild , atypical CP   September 06, 2017:  Bailey [name] is doing well.  She is seen back for follow-up of her high blood pressure and bicuspid aortic valve.  Had a skin cancer removed from right side of jaw  No CP or dyspnea. Not as much exercise as she should be getting .  Knows that she would benefit from going     Current Outpatient Medications on File Prior to Visit  Medication Sig Dispense Refill  . amoxicillin (AMOXIL) 500 MG capsule Take 1 capsule by mouth as needed (AS NEEDED FOR DENTAL PROCEDURES).   0  . BIOTIN 5000 PO Take 5,000 mcg by mouth daily.    Marland Kitchen donepezil (ARICEPT) 5 MG tablet Take 5 mg by mouth daily.    Marland Kitchen escitalopram (LEXAPRO) 10 MG tablet Take 10 mg by mouth daily.     Marland Kitchen gabapentin (NEURONTIN) 100 MG capsule Take 100 mg by mouth daily.    . hydrochlorothiazide (HYDRODIURIL) 25 MG tablet TAKE 1 TABLET BY MOUTH ONCE DAILY 90 tablet 3  . metoprolol succinate (TOPROL-XL) 50 MG 24 hr tablet Take 1 tablet by  mouth daily.  10  . Multiple Vitamin (MULITIVITAMIN WITH MINERALS) TABS Take 1 tablet by mouth daily.    . potassium chloride (K-DUR) 10 MEQ tablet Take 1 tablet (10 mEq total) by mouth daily. 30 tablet 0  . triamterene-hydrochlorothiazide (MAXZIDE-25) 37.5-25 MG per tablet Take 0.5 tablets by mouth daily.      No current facility-administered medications on file prior to visit.     Allergies  Allergen Reactions  . Advil [Ibuprofen]     vomiting  . Ibuprofen Nausea And Vomiting    vertigo  . Metronidazole Diarrhea and Nausea And Vomiting  . Ondansetron Nausea And Vomiting    Past Medical History:  Diagnosis Date  . Anxiety   . Aortic insufficiency   . Arthritis    "hands" (01/06/2013)  . Basal cell carcinoma of skin   . Bicuspid aortic valve   . Bicuspid aortic valve   . Cataract    right  . Colitis, collagenous summer of 2013  . Colon polyp   . DDD (degenerative disc disease) 12/2009  . GERD (gastroesophageal reflux disease)    "/endoscopy; doesn't bother me at all" (01/06/2013)  . Hard of hearing  bilat  . Heart murmur   . Hx of migraines   . Hyperlipidemia   . Hypertension   . Left facial numbness    previous, not current  . Migraine    "used to have once/month; none in years" (01/06/2013)  . Palpitations   . Pneumonia ~ 1944; ~ 2009  . Tinnitus   . Vertigo     Past Surgical History:  Procedure Laterality Date  . CATARACT EXTRACTION EXTRACAPSULAR Bilateral 2015  . COLONOSCOPY N/A 01/07/2013   Procedure: COLONOSCOPY;  Surgeon: Cleotis Nipper, MD;  Location: [facility] ENDOSCOPY;  Service: Endoscopy;  Laterality: N/A;  The colonoscopy will be unprepped  . DILATION AND CURETTAGE OF UTERUS  2002   w/hysteroscopy  . ESOPHAGOGASTRODUODENOSCOPY N/A 01/07/2013   Procedure: ESOPHAGOGASTRODUODENOSCOPY (EGD);  Surgeon: Cleotis Nipper, MD;  Location: [facility] ENDOSCOPY;  Service: Endoscopy;  Laterality: N/A;  . HYSTEROSCOPY  2002   D&C(sub fibroid)   . HYSTEROSCOPY  2006    resection polyps D&C  . THYROIDECTOMY, PARTIAL     "removed gland from left side of neck that was to go down and past thyroid in front" (01/06/2013)  . TONSILLECTOMY     "when I was little" (01/06/2013)  . TUBAL LIGATION Bilateral 1970's  . TUMOR REMOVAL  1990's & 06/23/2012, 02/2014   "benign; roof of mouth" (01/06/2013)    Social History   Tobacco Use  Smoking Status Never Smoker  Smokeless Tobacco Never Used    Social History   Substance and Sexual Activity  Alcohol Use No  . Alcohol/week: 0.0 oz    Family History  Problem Relation Age of Onset  . Bone cancer Father   . Hypertension Father   . Thyroid disease Father        hypo  . Brain cancer Brother   . Arrhythmia Mother   . Osteoporosis Mother   . Cancer Mother        colon  . Diabetes Mother   . Hypertension Mother     Reviw of Systems:  Reviewed in the HPI.  All other systems are negative.  Physical Exam: Blood pressure (!) 152/70, pulse (!) 54, height 5' 2.5" (1.588 m), weight 112 lb 3.2 oz (50.9 kg), last menstrual period 06/23/2010, SpO2 94 %.  GEN:  Well nourished, well developed in no acute distress HEENT: Normal, left side of her lower jaw is bandaged on the right side. NECK: No JVD; No carotid bruits LYMPHATICS: No lymphadenopathy CARDIAC: RR, soft systolic and very soft diastolic murmur. RESPIRATORY:  Clear to auscultation without rales, wheezing or rhonchi  ABDOMEN: Soft, non-tender, non-distended MUSCULOSKELETAL:  No edema; No deformity  SKIN: Warm and dry NEUROLOGIC:  Alert and oriented x 3  ECG: September 06, 2017: Sinus bradycardia 55.  Incomplete right bundle branch block.  Left anterior fascicular block.  Assessment / Plan:   1. Bicuspid aortic valve / moderate aortic insufficiency    It has been 5 years since her last echocardiogram.  We will repeat her echo for further evaluation.  I will see her again in 1 year.  2. Dyslipidemia - followed by Dr. Joylene Draft   3. Hypertension -BP is  normal with recheck     Mertie Moores, MD  09/06/2017 10:42 AM    [facility] [place] [facility],  [address], [place]  [address] Pager [phone] Phone: [phone]; Fax: [phone]

## 2017-09-06 NOTE — Patient Instructions (Signed)
Medication Instructions:  Your physician recommends that you continue on your current medications as directed. Please refer to the Current Medication list given to you today.   Labwork: None Ordered   Testing/Procedures: Your physician has requested that you have an echocardiogram. Echocardiography is a painless test that uses sound waves to create images of your heart. It provides your doctor with information about the size and shape of your heart and how well your heart's chambers and valves are working. This procedure takes approximately one hour. There are no restrictions for this procedure.   Follow-Up: Your physician wants you to follow-up in: 1 year with Dr. Nahser. You will receive a reminder letter in the mail two months in advance. If you don't receive a letter, please call our office to schedule the follow-up appointment.   If you need a refill on your cardiac medications before your next appointment, please call your pharmacy.   Thank you for choosing [facility]! Jazmyn Offner, RN [phone]    

## 2017-09-07 NOTE — Progress Notes (Deleted)
76 y.o. G39P2002 Married Caucasian female here for annual exam.    PCP:     Patient's last menstrual period was 06/23/2010 (approximate).           Sexually active: {yes no:314532}  The current method of family planning is post menopausal status.    Exercising: {yes no:314532}  {types:19826} Smoker:  no  Health Maintenance: Pap: 07-26-16 Neg, 06-22-14 Negative History of abnormal Pap:  no MMG: 09-20-16 Density C/Neg/BiRads1:TBC --Appt.09-24-17 Colonoscopy:  01-07-13 history of colitis with Dr. Cristina Gong. Next due 12/2017.  BMD: ***?05/2014  Result :Osteopenia with Dr.Perini TDaP:  PCP Gardasil:   no HIV:*** Hep C: *** Screening Labs:  Hb today: ***, Urine today: ***   reports that  has never smoked. she has never used smokeless tobacco. She reports that she does not drink alcohol or use drugs.  Past Medical History:  Diagnosis Date  . Anxiety   . Aortic insufficiency   . Arthritis    "hands" (01/06/2013)  . Basal cell carcinoma of skin   . Bicuspid aortic valve   . Bicuspid aortic valve   . Cataract    right  . Colitis, collagenous summer of 2013  . Colon polyp   . DDD (degenerative disc disease) 12/2009  . GERD (gastroesophageal reflux disease)    "/endoscopy; doesn't bother me at all" (01/06/2013)  . Hard of hearing    bilat  . Heart murmur   . Hx of migraines   . Hyperlipidemia   . Hypertension   . Left facial numbness    previous, not current  . Migraine    "used to have once/month; none in years" (01/06/2013)  . Palpitations   . Pneumonia ~ 1944; ~ 2009  . Tinnitus   . Vertigo     Past Surgical History:  Procedure Laterality Date  . CATARACT EXTRACTION EXTRACAPSULAR Bilateral 2015  . COLONOSCOPY N/A 01/07/2013   Performed by Cleotis Nipper, MD at [facility] ENDOSCOPY  . DILATION AND CURETTAGE OF UTERUS  2002   w/hysteroscopy  . ESOPHAGOGASTRODUODENOSCOPY (EGD) N/A 01/07/2013   Performed by Cleotis Nipper, MD at [facility]  . HYSTEROSCOPY  2002   D&C(sub  fibroid)   . HYSTEROSCOPY  2006   resection polyps D&C  . THYROIDECTOMY, PARTIAL     "removed gland from left side of neck that was to go down and past thyroid in front" (01/06/2013)  . TONSILLECTOMY     "when I was little" (01/06/2013)  . TUBAL LIGATION Bilateral 1970's  . TUMOR REMOVAL  1990's & 06/23/2012, 02/2014   "benign; roof of mouth" (01/06/2013)    Current Outpatient Medications  Medication Sig Dispense Refill  . amoxicillin (AMOXIL) 500 MG capsule Take 1 capsule by mouth as needed (AS NEEDED FOR DENTAL PROCEDURES).   0  . BIOTIN 5000 PO Take 5,000 mcg by mouth daily.    Marland Kitchen donepezil (ARICEPT) 5 MG tablet Take 5 mg by mouth daily.    Marland Kitchen escitalopram (LEXAPRO) 10 MG tablet Take 10 mg by mouth daily.     Marland Kitchen gabapentin (NEURONTIN) 100 MG capsule Take 100 mg by mouth daily.    . hydrochlorothiazide (HYDRODIURIL) 25 MG tablet TAKE 1 TABLET BY MOUTH ONCE DAILY 90 tablet 3  . metoprolol succinate (TOPROL-XL) 50 MG 24 hr tablet Take 1 tablet by mouth daily.  10  . Multiple Vitamin (MULITIVITAMIN WITH MINERALS) TABS Take 1 tablet by mouth daily.    . potassium chloride (K-DUR) 10 MEQ tablet Take 1 tablet (  10 mEq total) by mouth daily. 30 tablet 0  . triamterene-hydrochlorothiazide (MAXZIDE-25) 37.5-25 MG per tablet Take 0.5 tablets by mouth daily.      No current facility-administered medications for this visit.     Family History  Problem Relation Age of Onset  . Bone cancer Father   . Hypertension Father   . Thyroid disease Father        hypo  . Brain cancer Brother   . Arrhythmia Mother   . Osteoporosis Mother   . Cancer Mother        colon  . Diabetes Mother   . Hypertension Mother     ROS:  Pertinent items are noted in HPI.  Otherwise, a comprehensive ROS was negative.  Exam:   LMP 06/23/2010 (Approximate)     General appearance: alert, cooperative and appears stated age Head: Normocephalic, without obvious abnormality, atraumatic Neck: no adenopathy, supple,  symmetrical, trachea midline and thyroid normal to inspection and palpation Lungs: clear to auscultation bilaterally Breasts: normal appearance, no masses or tenderness, No nipple retraction or dimpling, No nipple discharge or bleeding, No axillary or supraclavicular adenopathy Heart: regular rate and rhythm Abdomen: soft, non-tender; no masses, no organomegaly Extremities: extremities normal, atraumatic, no cyanosis or edema Skin: Skin color, texture, turgor normal. No rashes or lesions Lymph nodes: Cervical, supraclavicular, and axillary nodes normal. No abnormal inguinal nodes palpated Neurologic: Grossly normal  Pelvic: External genitalia:  no lesions              Urethra:  normal appearing urethra with no masses, tenderness or lesions              Bartholins and Skenes: normal                 Vagina: normal appearing vagina with normal color and discharge, no lesions              Cervix: no lesions              Pap taken: {yes no:314532} Bimanual Exam:  Uterus:  normal size, contour, position, consistency, mobility, non-tender              Adnexa: no mass, fullness, tenderness              Rectal exam: {yes no:314532}.  Confirms.              Anus:  normal sphincter tone, no lesions  Chaperone was present for exam.  Assessment:   Well woman visit with normal exam.   Plan: Mammogram screening discussed. Recommended self breast awareness. Pap and HR HPV as above. Guidelines for Calcium, Vitamin D, regular exercise program including cardiovascular and weight bearing exercise.   Follow up annually and prn.   Additional counseling given.  {yes [id number]. _______ minutes face to face time of which over 50% was spent in counseling.    After visit summary provided.

## 2017-09-10 ENCOUNTER — Ambulatory Visit: Payer: Medicare Other | Admitting: Obstetrics and Gynecology

## 2017-09-12 ENCOUNTER — Telehealth: Payer: Self-pay | Admitting: Nurse Practitioner

## 2017-09-12 NOTE — Telephone Encounter (Signed)
Received call from Chastitie in our billing department regarding patient's echo scheduled for Monday. She advised that insurance was fighting the need since the patient had one in July. Per Dr. Acie Fredrickson, a repeat echo is not needed at this time. I advised patient that her appointment for echo on Monday 11/26 has been cancelled. I called patient and notified her of the change in plan and advised we will recheck at a later time. She verbalized understanding and agreement with plan.

## 2017-09-17 ENCOUNTER — Other Ambulatory Visit (HOSPITAL_COMMUNITY): Payer: Medicare Other

## 2017-09-20 ENCOUNTER — Other Ambulatory Visit: Payer: Self-pay

## 2017-09-20 ENCOUNTER — Ambulatory Visit (INDEPENDENT_AMBULATORY_CARE_PROVIDER_SITE_OTHER): Payer: Medicare Other | Admitting: Certified Nurse Midwife

## 2017-09-20 ENCOUNTER — Encounter: Payer: Self-pay | Admitting: Certified Nurse Midwife

## 2017-09-20 VITALS — BP 132/80 | HR 70 | Resp 16 | Ht 62.5 in | Wt 112.0 lb

## 2017-09-20 DIAGNOSIS — N951 Menopausal and female climacteric states: Secondary | ICD-10-CM | POA: Diagnosis not present

## 2017-09-20 DIAGNOSIS — Z8679 Personal history of other diseases of the circulatory system: Secondary | ICD-10-CM | POA: Diagnosis not present

## 2017-09-20 DIAGNOSIS — Z01419 Encounter for gynecological examination (general) (routine) without abnormal findings: Secondary | ICD-10-CM

## 2017-09-20 NOTE — Progress Notes (Signed)
76 y.o. G16P2002 Married  Caucasian Fe here for annual exam. Menopausal no vaginal bleeding or vaginal dryness. Denies incontinence. Had flare of colitis, feeling much better. Sees  Dr. Acie Fredrickson for anxiety,cholesterol and hypertension management every 3 months all stable. Saw Dr. Jaynee Eagles neurology and was started on Aericept. Some memory changes and he thought this would help. Has follow in one month per patient. Feeling well, appetite good, no issues with ambulation or driving. No other health concerns today.  Patient's last menstrual period was 06/23/2010 (approximate).          Sexually active: Yes.    The current method of family planning is post menopausal status.    Exercising: No.  exercise Smoker:  no  Health Maintenance: Pap:  06-22-14 neg, 07-26-16 neg History of Abnormal Pap: no MMG:  09-20-16 category c density birads 1:neg Self Breast exams: yes Colonoscopy:  2014 f/u 2019 BMD:   2015, 2017 pcp management TDaP:  PCP Shingles: done Pneumonia: done Hep C and HIV: unsure Labs: No.   reports that  has never smoked. she has never used smokeless tobacco. She reports that she does not drink alcohol or use drugs.  Past Medical History:  Diagnosis Date  . Anxiety   . Aortic insufficiency   . Arthritis    "hands" (01/06/2013)  . Basal cell carcinoma of skin   . Bicuspid aortic valve   . Bicuspid aortic valve   . Cataract    right  . Colitis, collagenous summer of 2013  . Colon polyp   . DDD (degenerative disc disease) 12/2009  . GERD (gastroesophageal reflux disease)    "/endoscopy; doesn't bother me at all" (01/06/2013)  . Hard of hearing    bilat  . Heart murmur   . Hx of migraines   . Hyperlipidemia   . Hypertension   . Left facial numbness    previous, not current  . Migraine    "used to have once/month; none in years" (01/06/2013)  . Palpitations   . Pneumonia ~ 1944; ~ 2009  . Tinnitus   . Vertigo     Past Surgical History:  Procedure Laterality Date  .  CATARACT EXTRACTION EXTRACAPSULAR Bilateral 2015  . COLONOSCOPY N/A 01/07/2013   Procedure: COLONOSCOPY;  Surgeon: Cleotis Nipper, MD;  Location: [facility] ENDOSCOPY;  Service: Endoscopy;  Laterality: N/A;  The colonoscopy will be unprepped  . DILATION AND CURETTAGE OF UTERUS  2002   w/hysteroscopy  . ESOPHAGOGASTRODUODENOSCOPY N/A 01/07/2013   Procedure: ESOPHAGOGASTRODUODENOSCOPY (EGD);  Surgeon: Cleotis Nipper, MD;  Location: [facility] ENDOSCOPY;  Service: Endoscopy;  Laterality: N/A;  . HYSTEROSCOPY  2002   D&C(sub fibroid)   . HYSTEROSCOPY  2006   resection polyps D&C  . THYROIDECTOMY, PARTIAL     "removed gland from left side of neck that was to go down and past thyroid in front" (01/06/2013)  . TONSILLECTOMY     "when I was little" (01/06/2013)  . TUBAL LIGATION Bilateral 1970's  . TUMOR REMOVAL  1990's & 06/23/2012, 02/2014   "benign; roof of mouth" (01/06/2013)    Current Outpatient Medications  Medication Sig Dispense Refill  . donepezil (ARICEPT) 5 MG tablet Take 5 mg by mouth daily.    Marland Kitchen escitalopram (LEXAPRO) 10 MG tablet Take 10 mg by mouth daily.     Marland Kitchen gabapentin (NEURONTIN) 100 MG capsule Take 100 mg by mouth daily.    . hydrochlorothiazide (HYDRODIURIL) 25 MG tablet TAKE 1 TABLET BY MOUTH ONCE DAILY (Patient taking differently:  No sig reported) 90 tablet 3  . metoprolol succinate (TOPROL-XL) 50 MG 24 hr tablet Take by mouth daily. Takes 1/2  10  . Multiple Vitamin (MULITIVITAMIN WITH MINERALS) TABS Take 1 tablet by mouth daily.    . potassium chloride (K-DUR) 10 MEQ tablet Take 1 tablet (10 mEq total) by mouth daily. 30 tablet 0  . rosuvastatin (CRESTOR) 20 MG tablet   10  . triamterene-hydrochlorothiazide (MAXZIDE-25) 37.5-25 MG per tablet Take 1 tablet by mouth daily.     Marland Kitchen amoxicillin (AMOXIL) 500 MG capsule Take 1 capsule by mouth as needed (AS NEEDED FOR DENTAL PROCEDURES).   0   No current facility-administered medications for this visit.     Family History  Problem Relation  Age of Onset  . Bone cancer Father   . Hypertension Father   . Thyroid disease Father        hypo  . Brain cancer Brother   . Arrhythmia Mother   . Osteoporosis Mother   . Cancer Mother        colon  . Diabetes Mother   . Hypertension Mother     ROS:  Pertinent items are noted in HPI.  Otherwise, a comprehensive ROS was negative.  Exam:   BP (!) 160/80   Pulse 68   Resp 16   Ht 5' 2.5" (1.588 m)   Wt 112 lb (50.8 kg)   LMP 06/23/2010 (Approximate)   BMI 20.16 kg/m  Height: 5' 2.5" (158.8 cm) Ht Readings from Last 3 Encounters:  09/20/17 5' 2.5" (1.588 m)  09/06/17 5' 2.5" (1.588 m)  07/26/16 5' 2.75" (1.594 m)    General appearance: alert, cooperative and appears stated age Head: Normocephalic, without obvious abnormality, atraumatic Neck: no adenopathy, supple, symmetrical, trachea midline and thyroid normal to inspection and palpation Lungs: clear to auscultation bilaterally Breasts: normal appearance, no masses or tenderness, No nipple retraction or dimpling, No nipple discharge or bleeding, No axillary or supraclavicular adenopathy Heart: regular rate and rhythm Abdomen: soft, non-tender; no masses,  no organomegaly Extremities: extremities normal, atraumatic, no cyanosis or edema Skin: Skin color, texture, turgor normal. No rashes or lesions Lymph nodes: Cervical, supraclavicular, and axillary nodes normal. No abnormal inguinal nodes palpated Neurologic: Grossly normal   Pelvic: External genitalia:  no lesions              Urethra:  normal appearing urethra with no masses, tenderness or lesions              Bartholin's and Skene's: normal                 Vagina: slight atrophic appearing vagina with normal color and discharge, no lesions              Cervix: multiparous appearance, no cervical motion tenderness and no lesions              Pap taken: No. Bimanual Exam:  Uterus:  normal size, contour, position, consistency, mobility, non-tender and anteverted               Adnexa: normal adnexa and no mass, fullness, tenderness               Rectovaginal: Confirms               Anus:  normal sphincter tone, no lesions  Chaperone present: yes  A:  Well Woman with normal exam  Menopausal no HRT  Hypertension, cholesterol, anxiety, memory issues with MD management  Mammogram over due  P:   Reviewed health and wellness   pertinent to exam  Discussed need to advise if vaginal bleeding.  Continue follow up with MD as indicated.  Patient has scheduled for 09/27/17, given written reminder for patient.  Pap smear: no   counseled on breast self exam, mammography screening, feminine hygiene, adequate intake of calcium and vitamin D, diet and exercise  return annually or prn  An After Visit Summary was printed and given to the patient.

## 2017-09-24 ENCOUNTER — Ambulatory Visit
Admission: RE | Admit: 2017-09-24 | Discharge: 2017-09-24 | Disposition: A | Discharge status: Home / Self Care | Payer: Medicare Other | Source: Ambulatory Visit | Attending: Internal Medicine | Admitting: Internal Medicine

## 2017-09-24 DIAGNOSIS — Z1231 Encounter for screening mammogram for malignant neoplasm of breast: Secondary | ICD-10-CM

## 2017-09-25 ENCOUNTER — Ambulatory Visit: Payer: Medicare Other | Admitting: Obstetrics and Gynecology

## 2017-10-31 DIAGNOSIS — K52831 Collagenous colitis: Secondary | ICD-10-CM | POA: Diagnosis not present

## 2017-10-31 DIAGNOSIS — Z8371 Family history of colonic polyps: Secondary | ICD-10-CM | POA: Diagnosis not present

## 2017-11-04 IMAGING — CR DG CERVICAL SPINE COMPLETE 4+V
7 series · 7 of 7 positions shown · non-contrast
Comparison: None.

CLINICAL DATA: Bilateral neck pain and tightness with limited range
of motion.

EXAM:
CERVICAL SPINE - COMPLETE 4+ VIEW

[w cervical spine lat]
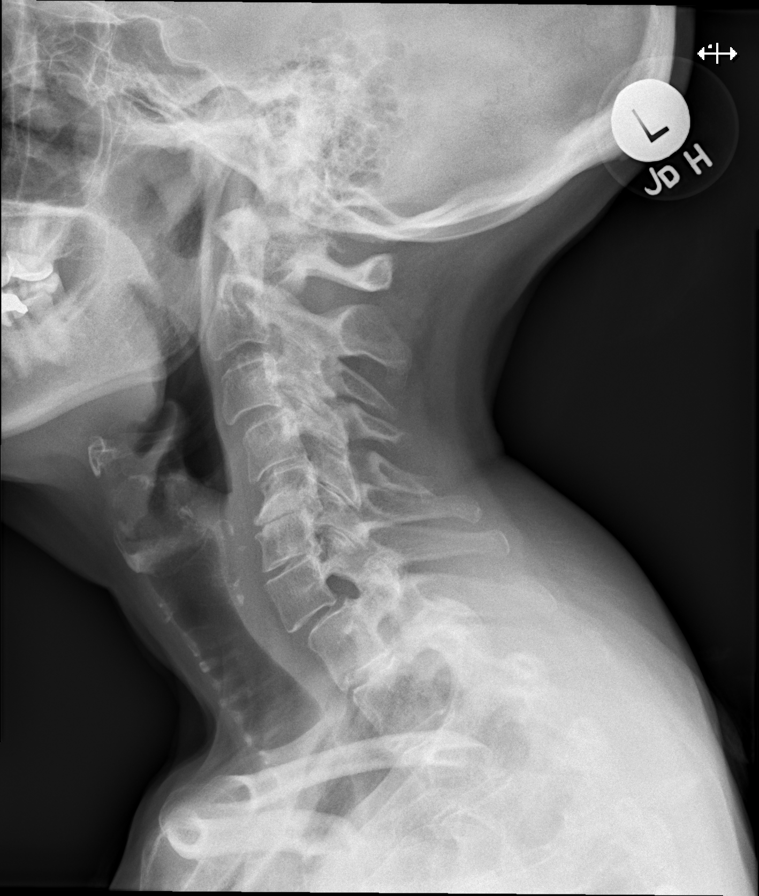

[w cervical spine ap_obl (1 of 2)]
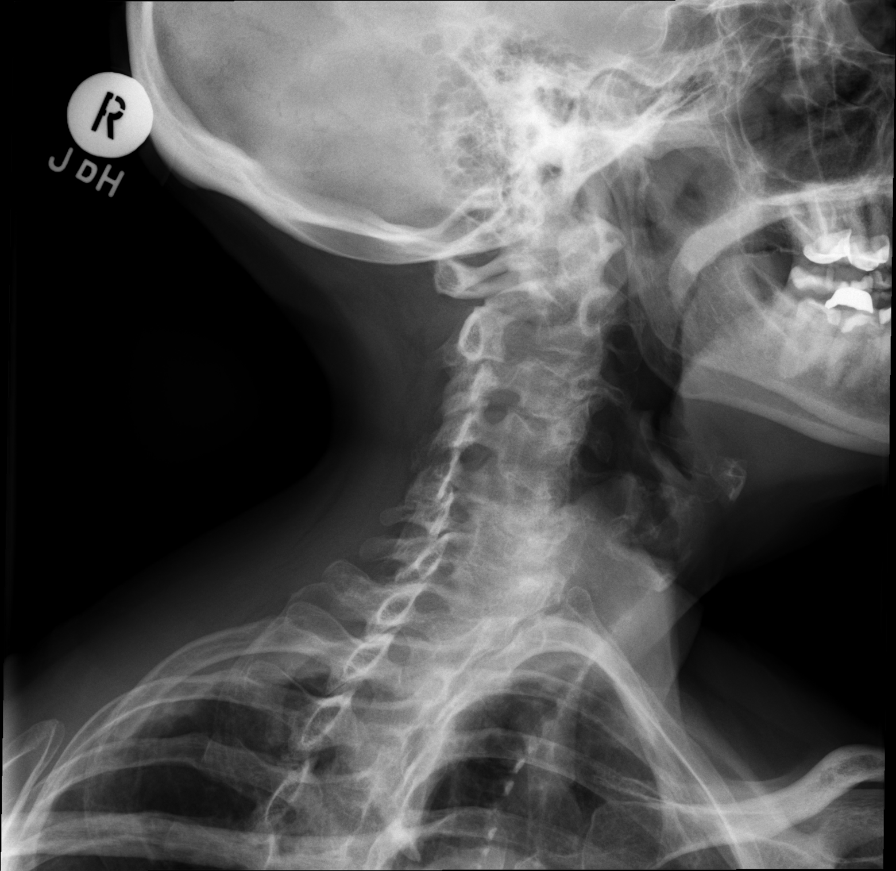

[w cervical spine ap_obl (2 of 2)]
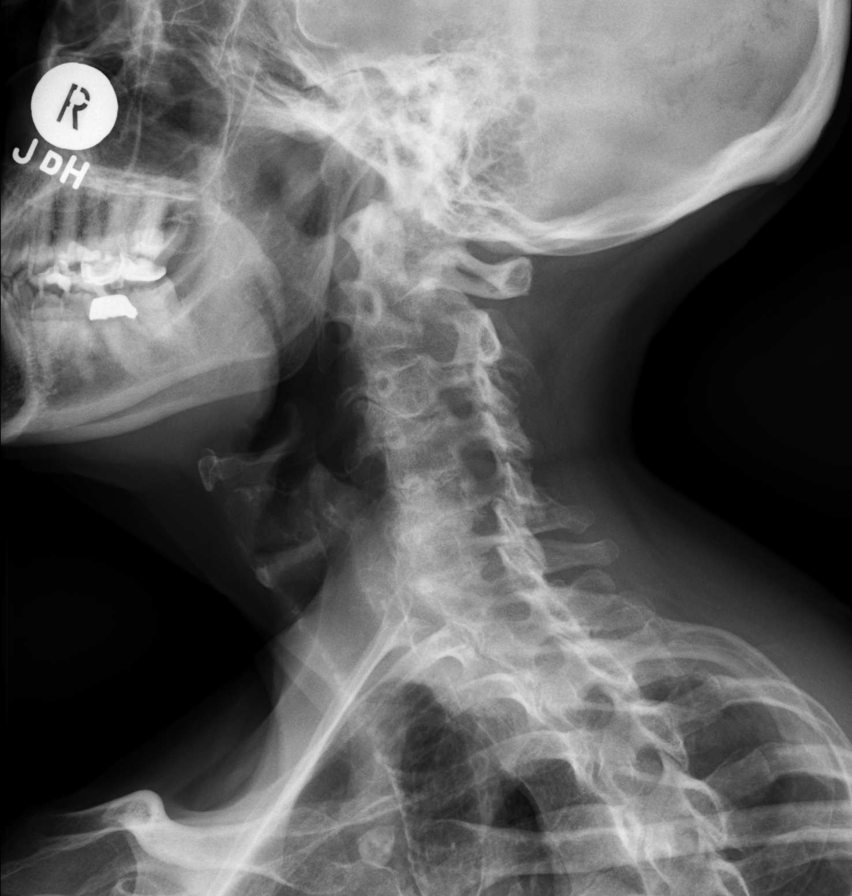

[w cervical spine ap]
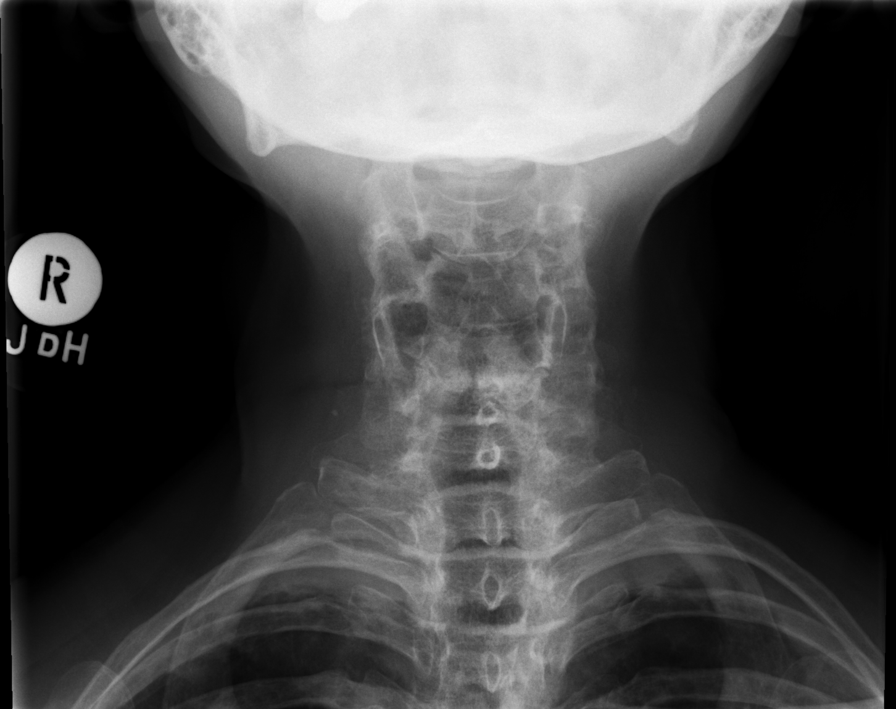

[w cervical spine odontoid]
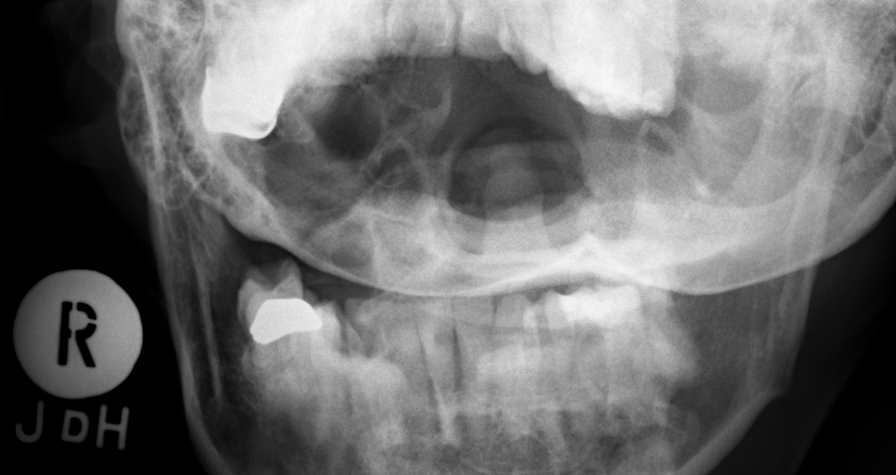

[t cervical spine odontoid (1 of 2)]
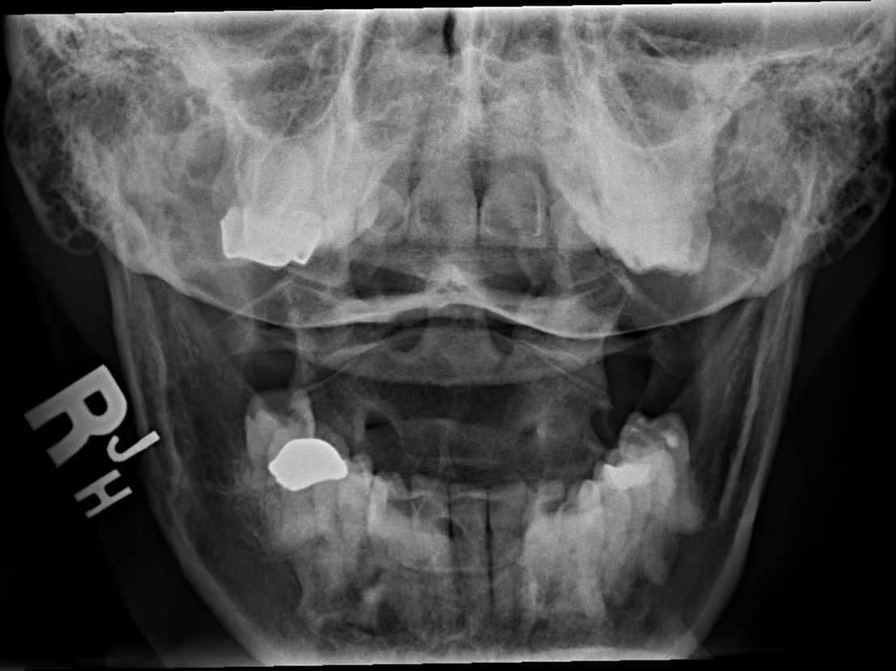

[t cervical spine odontoid (2 of 2)]
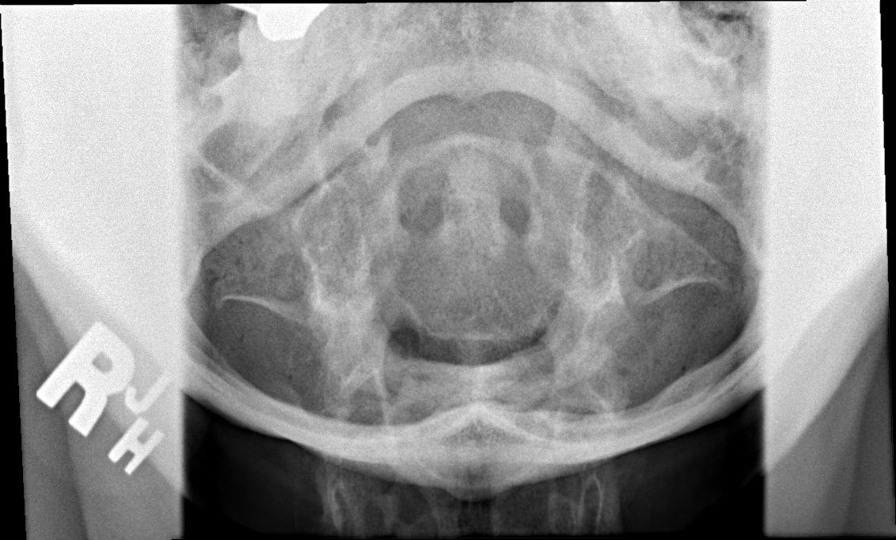

[7 of 7 positions shown; findings below may reference images not displayed]

FINDINGS: Advanced cervical spondylosis noted at C4-5, C5-6 and C6-7. There is
severe disc space narrowing at C5-6 with associated endplate
sclerosis. There is moderate disc space narrowing at C4-5 and C6-7.
Some associated loss of lordosis noted with probable component of
mild retrolisthesis of C5 relative to C6. There also suggestion of
mild anterolisthesis of C7 relative to T1. Oblique views show at
least mild bony foraminal stenosis at all of the affected levels. No
significant spondylosis noted at C2-3 or C3-4.

No acute fracture or evidence of prevertebral soft tissue swelling.
IMPRESSION: Advanced cervical spondylosis, as above. The most severe disc space
narrowing and proliferative changes are at C5-6. There is suggestion
of mild retrolisthesis of C5 on C6 and anterolisthesis of C7 on T1.

## 2017-11-07 DIAGNOSIS — R413 Other amnesia: Secondary | ICD-10-CM | POA: Diagnosis not present

## 2017-11-07 DIAGNOSIS — I10 Essential (primary) hypertension: Secondary | ICD-10-CM | POA: Diagnosis not present

## 2017-11-07 DIAGNOSIS — G6289 Other specified polyneuropathies: Secondary | ICD-10-CM | POA: Diagnosis not present

## 2017-11-07 DIAGNOSIS — Q231 Congenital insufficiency of aortic valve: Secondary | ICD-10-CM | POA: Diagnosis not present

## 2018-01-10 ENCOUNTER — Other Ambulatory Visit: Payer: Self-pay | Admitting: Cardiovascular Disease

## 2018-03-13 DIAGNOSIS — I10 Essential (primary) hypertension: Secondary | ICD-10-CM | POA: Diagnosis not present

## 2018-03-13 DIAGNOSIS — Q231 Congenital insufficiency of aortic valve: Secondary | ICD-10-CM | POA: Diagnosis not present

## 2018-03-13 DIAGNOSIS — R7301 Impaired fasting glucose: Secondary | ICD-10-CM | POA: Diagnosis not present

## 2018-03-13 DIAGNOSIS — F0390 Unspecified dementia without behavioral disturbance: Secondary | ICD-10-CM | POA: Diagnosis not present

## 2018-04-10 DIAGNOSIS — E7849 Other hyperlipidemia: Secondary | ICD-10-CM | POA: Diagnosis not present

## 2018-04-10 DIAGNOSIS — Z7189 Other specified counseling: Secondary | ICD-10-CM | POA: Diagnosis not present

## 2018-04-10 DIAGNOSIS — I10 Essential (primary) hypertension: Secondary | ICD-10-CM | POA: Diagnosis not present

## 2018-04-10 DIAGNOSIS — F0390 Unspecified dementia without behavioral disturbance: Secondary | ICD-10-CM | POA: Diagnosis not present

## 2018-05-08 DIAGNOSIS — I10 Essential (primary) hypertension: Secondary | ICD-10-CM | POA: Diagnosis not present

## 2018-05-08 DIAGNOSIS — F0390 Unspecified dementia without behavioral disturbance: Secondary | ICD-10-CM | POA: Diagnosis not present

## 2018-05-08 DIAGNOSIS — Z7189 Other specified counseling: Secondary | ICD-10-CM | POA: Diagnosis not present

## 2018-05-08 DIAGNOSIS — E7849 Other hyperlipidemia: Secondary | ICD-10-CM | POA: Diagnosis not present

## 2018-05-14 DIAGNOSIS — R110 Nausea: Secondary | ICD-10-CM | POA: Diagnosis not present

## 2018-05-14 DIAGNOSIS — R531 Weakness: Secondary | ICD-10-CM | POA: Diagnosis not present

## 2018-05-14 DIAGNOSIS — E876 Hypokalemia: Secondary | ICD-10-CM | POA: Diagnosis not present

## 2018-05-14 DIAGNOSIS — R5381 Other malaise: Secondary | ICD-10-CM | POA: Diagnosis not present

## 2018-05-17 DIAGNOSIS — E876 Hypokalemia: Secondary | ICD-10-CM | POA: Diagnosis not present

## 2018-05-29 DIAGNOSIS — K52831 Collagenous colitis: Secondary | ICD-10-CM | POA: Diagnosis not present

## 2018-05-29 DIAGNOSIS — Z8371 Family history of colonic polyps: Secondary | ICD-10-CM | POA: Diagnosis not present

## 2018-05-29 DIAGNOSIS — R634 Abnormal weight loss: Secondary | ICD-10-CM | POA: Diagnosis not present

## 2018-05-29 DIAGNOSIS — Z1211 Encounter for screening for malignant neoplasm of colon: Secondary | ICD-10-CM | POA: Diagnosis not present

## 2018-06-04 DIAGNOSIS — Z8371 Family history of colonic polyps: Secondary | ICD-10-CM | POA: Diagnosis not present

## 2018-06-05 DIAGNOSIS — H5213 Myopia, bilateral: Secondary | ICD-10-CM | POA: Diagnosis not present

## 2018-06-17 DIAGNOSIS — I10 Essential (primary) hypertension: Secondary | ICD-10-CM | POA: Diagnosis not present

## 2018-06-17 DIAGNOSIS — F0390 Unspecified dementia without behavioral disturbance: Secondary | ICD-10-CM | POA: Diagnosis not present

## 2018-06-17 DIAGNOSIS — R110 Nausea: Secondary | ICD-10-CM | POA: Diagnosis not present

## 2018-06-17 DIAGNOSIS — E876 Hypokalemia: Secondary | ICD-10-CM | POA: Diagnosis not present

## 2018-07-10 DIAGNOSIS — Z23 Encounter for immunization: Secondary | ICD-10-CM | POA: Diagnosis not present

## 2018-07-16 DIAGNOSIS — I10 Essential (primary) hypertension: Secondary | ICD-10-CM | POA: Diagnosis not present

## 2018-07-16 DIAGNOSIS — R110 Nausea: Secondary | ICD-10-CM | POA: Diagnosis not present

## 2018-07-16 DIAGNOSIS — F0390 Unspecified dementia without behavioral disturbance: Secondary | ICD-10-CM | POA: Diagnosis not present

## 2018-07-16 DIAGNOSIS — Z7189 Other specified counseling: Secondary | ICD-10-CM | POA: Diagnosis not present

## 2018-08-20 ENCOUNTER — Other Ambulatory Visit: Payer: Self-pay | Admitting: Internal Medicine

## 2018-08-20 DIAGNOSIS — Z1231 Encounter for screening mammogram for malignant neoplasm of breast: Secondary | ICD-10-CM

## 2018-08-28 DIAGNOSIS — R82998 Other abnormal findings in urine: Secondary | ICD-10-CM | POA: Diagnosis not present

## 2018-08-28 DIAGNOSIS — E7849 Other hyperlipidemia: Secondary | ICD-10-CM | POA: Diagnosis not present

## 2018-08-28 DIAGNOSIS — M859 Disorder of bone density and structure, unspecified: Secondary | ICD-10-CM | POA: Diagnosis not present

## 2018-08-28 DIAGNOSIS — R7301 Impaired fasting glucose: Secondary | ICD-10-CM | POA: Diagnosis not present

## 2018-08-28 DIAGNOSIS — I10 Essential (primary) hypertension: Secondary | ICD-10-CM | POA: Diagnosis not present

## 2018-09-04 DIAGNOSIS — Z7189 Other specified counseling: Secondary | ICD-10-CM | POA: Diagnosis not present

## 2018-09-04 DIAGNOSIS — Z0000 Encounter for general adult medical examination without abnormal findings: Secondary | ICD-10-CM | POA: Diagnosis not present

## 2018-09-04 DIAGNOSIS — I10 Essential (primary) hypertension: Secondary | ICD-10-CM | POA: Diagnosis not present

## 2018-09-04 DIAGNOSIS — M859 Disorder of bone density and structure, unspecified: Secondary | ICD-10-CM | POA: Diagnosis not present

## 2018-09-04 DIAGNOSIS — Z5181 Encounter for therapeutic drug level monitoring: Secondary | ICD-10-CM | POA: Diagnosis not present

## 2018-09-06 ENCOUNTER — Encounter: Payer: Self-pay | Admitting: Cardiovascular Disease

## 2018-09-06 ENCOUNTER — Ambulatory Visit: Payer: Medicare Other | Admitting: Cardiovascular Disease

## 2018-09-06 VITALS — BP 182/90 | HR 57 | Ht 62.5 in | Wt 104.0 lb

## 2018-09-06 DIAGNOSIS — I10 Essential (primary) hypertension: Secondary | ICD-10-CM

## 2018-09-06 DIAGNOSIS — Q231 Congenital insufficiency of aortic valve: Secondary | ICD-10-CM | POA: Diagnosis not present

## 2018-09-06 MED ORDER — LOSARTAN POTASSIUM 50 MG PO TABS: 50.0000 mg | ORAL_TABLET | Freq: Every day | ORAL | 11 refills | Status: DC

## 2018-09-06 NOTE — Progress Notes (Signed)
Bailey [name] Date of Birth  [birth year]             Problem List: 1. Bicuspid aortic valve / moderate aortic insufficiency 2. Dyslipidemia 3. Hypertension  History of Present Illness:  Bailey [name] is a 77 y.o. female with the above medical problems.  She has had chronic diarrhea and was found to have colitis. She is taking some medications and is getting better.  She is exercising some.  She has been spending a lot of time at the nursing home taking care of her mother. She's not been able to exercise as much she would like.  She's not had any cardiac problems doing her normal daily activities.  May 04, 2014:  Bailey [name] is doing ok.  She has had some fatigue.  Had  colitis.  She had acute renal failure and was very dehydrated.    May 04, 2015: Bailey [name] is doing well .  Still having problems with the colitis.  No CP or dyspnea.   May 04, 2016:  Bailey [name] is dong well  Followed a bicuspid AV with moderate AI .  Has dyslipidemia and  HTN BP is normal today  Some mild , atypical CP   September 06, 2017:  Bailey [name] is doing well.  She is seen back for follow-up of her high blood pressure and bicuspid aortic valve.  Had a skin cancer removed from right side of jaw  No CP or dyspnea. Not as much exercise as she should be getting .  Knows that she would benefit from going   September 06, 2018: Seen today for follow-up of her bicuspid aortic valve. No CP or dyspnea.  Getting some exercise Is losing weight Eats well  Wt today is 104 lbs   ( down from 112 lbs last year )     Current Outpatient Medications on File Prior to Visit  Medication Sig Dispense Refill  . amoxicillin (AMOXIL) 500 MG capsule Take 1 capsule by mouth as needed (AS NEEDED FOR DENTAL PROCEDURES).   0  . donepezil (ARICEPT) 5 MG tablet Take 5 mg by mouth daily.    Marland Kitchen escitalopram (LEXAPRO) 10 MG tablet Take 10 mg by mouth daily.     Marland Kitchen gabapentin (NEURONTIN) 100 MG capsule Take 100 mg by mouth daily.    .  hydrochlorothiazide (HYDRODIURIL) 25 MG tablet TAKE 1 TABLET BY MOUTH ONCE DAILY 90 tablet 2  . metoprolol succinate (TOPROL-XL) 50 MG 24 hr tablet Take by mouth daily. Takes 1/2  10  . Multiple Vitamin (MULITIVITAMIN WITH MINERALS) TABS Take 1 tablet by mouth daily.    . potassium chloride (K-DUR) 10 MEQ tablet Take 1 tablet (10 mEq total) by mouth daily. 30 tablet 0  . rosuvastatin (CRESTOR) 20 MG tablet Take 20 mg by mouth daily.   10  . triamterene-hydrochlorothiazide (MAXZIDE-25) 37.5-25 MG per tablet Take 1 tablet by mouth daily.      No current facility-administered medications on file prior to visit.     Allergies  Allergen Reactions  . Advil [Ibuprofen]     vomiting  . Ibuprofen Nausea And Vomiting    vertigo  . Metronidazole Diarrhea and Nausea And Vomiting  . Ondansetron Nausea And Vomiting    Past Medical History:  Diagnosis Date  . Anxiety   . Aortic insufficiency   . Arthritis    "hands" (01/06/2013)  . Basal cell carcinoma of skin   . Bicuspid aortic valve   . Bicuspid aortic valve   .  Cataract    right  . Colitis, collagenous summer of 2013  . Colon polyp   . DDD (degenerative disc disease) 12/2009  . GERD (gastroesophageal reflux disease)    "/endoscopy; doesn't bother me at all" (01/06/2013)  . Hard of hearing    bilat  . Heart murmur   . Hx of migraines   . Hyperlipidemia   . Hypertension   . Left facial numbness    previous, not current  . Migraine    "used to have once/month; none in years" (01/06/2013)  . Palpitations   . Pneumonia ~ 1944; ~ 2009  . Tinnitus   . Vertigo     Past Surgical History:  Procedure Laterality Date  . CATARACT EXTRACTION EXTRACAPSULAR Bilateral 2015  . COLONOSCOPY N/A 01/07/2013   Procedure: COLONOSCOPY;  Surgeon: Cleotis Nipper, MD;  Location: [facility] ENDOSCOPY;  Service: Endoscopy;  Laterality: N/A;  The colonoscopy will be unprepped  . DILATION AND CURETTAGE OF UTERUS  2002   w/hysteroscopy  .  ESOPHAGOGASTRODUODENOSCOPY N/A 01/07/2013   Procedure: ESOPHAGOGASTRODUODENOSCOPY (EGD);  Surgeon: Cleotis Nipper, MD;  Location: [facility] ENDOSCOPY;  Service: Endoscopy;  Laterality: N/A;  . HYSTEROSCOPY  2002   D&C(sub fibroid)   . HYSTEROSCOPY  2006   resection polyps D&C  . THYROIDECTOMY, PARTIAL     "removed gland from left side of neck that was to go down and past thyroid in front" (01/06/2013)  . TONSILLECTOMY     "when I was little" (01/06/2013)  . TUBAL LIGATION Bilateral 1970's  . TUMOR REMOVAL  1990's & 06/23/2012, 02/2014   "benign; roof of mouth" (01/06/2013)    Social History   Tobacco Use  Smoking Status Never Smoker  Smokeless Tobacco Never Used    Social History   Substance and Sexual Activity  Alcohol Use No  . Alcohol/week: 0.0 standard drinks    Family History  Problem Relation Age of Onset  . Bone cancer Father   . Hypertension Father   . Thyroid disease Father        hypo  . Brain cancer Brother   . Arrhythmia Mother   . Osteoporosis Mother   . Cancer Mother        colon  . Diabetes Mother   . Hypertension Mother     Reviw of Systems:  Reviewed in the HPI.  All other systems are negative.   Physical Exam: Blood pressure (!) 182/90, pulse (!) 57, height 5' 2.5" (1.588 m), weight 104 lb (47.2 kg), last menstrual period 06/23/2010, SpO2 97 %.  GEN:  Well nourished, well developed in no acute distress HEENT: Normal NECK: No JVD; No carotid bruits LYMPHATICS: No lymphadenopathy CARDIAC: RRR   RESPIRATORY:  Clear to auscultation without rales, wheezing or rhonchi  ABDOMEN: Soft, non-tender, non-distended MUSCULOSKELETAL:  No edema; No deformity  SKIN: Warm and dry NEUROLOGIC:  Alert and oriented x 3   ECG: 15, 2019: Sinus bradycardia 57.  Complete right bundle branch block.  Assessment / Plan:   1. Bicuspid aortic valve / moderate aortic insufficiency     has a soft systolic murmur   2. Dyslipidemia -    3. Hypertension -  BP is very  elevated today .   Add Losartan 50 mg a day .    Nurse visit and BMP in 3 months      Mertie Moores, MD  09/06/2018 2:44 PM    [place],  [place], [place]  [address] Pager [place] Phone: [phone]; Fax: [phone]

## 2018-09-06 NOTE — Patient Instructions (Signed)
Medication Instructions:  Your physician has recommended you make the following change in your medication:   START Losartan (Cozaar) 50 mg once daily  If you need a refill on your cardiac medications before your next appointment, please call your pharmacy.   Lab work: Your physician recommends that you return for lab work in: 3 weeks on same day as nurse visit for basic metabolic panel  If you have labs (blood work) drawn today and your tests are completely normal, you will receive your results only by: Marland Kitchen MyChart Message (if you have MyChart) OR . A paper copy in the mail If you have any lab test that is abnormal or we need to change your treatment, we will call you to review the results.  Testing/Procedures: None Ordered   Follow-Up: Your physician recommends that you schedule a follow-up appointment in: 3 weeks for Nurse visit/BP check on a day when Dr. Acie Fredrickson is in the office   At [facility], you and your health needs are our priority.  As part of our continuing mission to provide you with exceptional heart care, we have created designated Provider Care Teams.  These Care Teams include your primary Cardiologist (physician) and Advanced Practice Providers (APPs -  Physician Assistants and Nurse Practitioners) who all work together to provide you with the care you need, when you need it. You will need a follow up appointment in:  3 months. You may see Mertie Moores, MD or one of the following Advanced Practice Providers on your designated Care Team: Richardson Dopp, PA-C [place], [place] . Daune Perch, NP

## 2018-09-09 ENCOUNTER — Other Ambulatory Visit: Payer: Self-pay

## 2018-09-09 ENCOUNTER — Encounter: Payer: Self-pay | Admitting: Obstetrics and Gynecology

## 2018-09-09 ENCOUNTER — Ambulatory Visit: Payer: Medicare Other | Admitting: Obstetrics and Gynecology

## 2018-09-09 VITALS — BP 120/66 | HR 66 | Ht 62.5 in | Wt 103.6 lb

## 2018-09-09 DIAGNOSIS — R300 Dysuria: Secondary | ICD-10-CM | POA: Diagnosis not present

## 2018-09-09 DIAGNOSIS — R319 Hematuria, unspecified: Secondary | ICD-10-CM

## 2018-09-09 LAB — POCT URINALYSIS DIPSTICK
Bilirubin, UA: NEGATIVE
Glucose, UA: NEGATIVE
Ketones, UA: NEGATIVE
Nitrite, UA: NEGATIVE
Protein, UA: NEGATIVE
Urobilinogen, UA: 0.2 E.U./dL
pH, UA: 8 (ref 5.0–8.0)

## 2018-09-09 MED ORDER — PHENAZOPYRIDINE HCL 100 MG PO TABS: 100.0000 mg | ORAL_TABLET | Freq: Three times a day (TID) | ORAL | 0 refills | Status: DC | PRN

## 2018-09-09 MED ORDER — SULFAMETHOXAZOLE-TRIMETHOPRIM 800-160 MG PO TABS: 1.0000 | ORAL_TABLET | Freq: Two times a day (BID) | ORAL | 0 refills | Status: DC

## 2018-09-09 NOTE — Patient Instructions (Signed)

## 2018-09-09 NOTE — Progress Notes (Signed)
GYNECOLOGY  VISIT   HPI: 77 y.o.   Married  Caucasian  female   G2P2002 with Patient's last menstrual period was 06/23/2010 (approximate).   here for dysuria.   States symptoms for one week.  Some urgency.  No hematuria.  No fever.  No back pain.  No nausea or vomiting.   Last UTI was 2016 - E Coli.   Urine Dip:2+WBCs, Mod.RBCs   States she has dementia.   PCP - Dr. Abner Greenspan.  GYNECOLOGIC HISTORY: Patient's last menstrual period was 06/23/2010 (approximate). Contraception: Postmenopausal Menopausal hormone therapy:  none Last mammogram: 09-24-17 neg/density c/Birads Last pap smear: 07-26-16 neg, 06-22-14 Neg        OB History    Gravida  2   Para  2   Term  2   Preterm      AB      Living  2     SAB      TAB      Ectopic      Multiple      Live Births  2              Patient Active Problem List   Diagnosis Date Noted  . Aortic insufficiency 09/06/2017  . Peripheral neuropathy 08/15/2017  . Tinnitus 05/18/2014  . Pleomorphic adenoma of minor salivary gland 01/13/2014  . Diarrhea 01/06/2013  . Nausea with vomiting 01/06/2013  . Abdominal pain, right upper quadrant 01/06/2013  . Palpitations 01/06/2013  . Bright red blood per rectum 01/06/2013  . Dehydration 01/06/2013  . Hypokalemia 01/06/2013  . Hyponatremia 01/06/2013  . Acute renal failure ([place]) 01/06/2013  . Migraine 03/14/2012  . Dizziness 03/14/2012  . Bicuspid aortic valve 02/27/2012  . HTN (hypertension) 02/27/2012  . Hyperlipidemia 02/27/2012    Past Medical History:  Diagnosis Date  . Anxiety   . Aortic insufficiency   . Arthritis    "hands" (01/06/2013)  . Basal cell carcinoma of skin   . Bicuspid aortic valve   . Bicuspid aortic valve   . Cataract    right  . Colitis, collagenous summer of 2013  . Colon polyp   . DDD (degenerative disc disease) 12/2009  . GERD (gastroesophageal reflux disease)    "/endoscopy; doesn't bother me at all" (01/06/2013)  . Hard of hearing     bilat  . Heart murmur   . Hx of migraines   . Hyperlipidemia   . Hypertension   . Left facial numbness    previous, not current  . Migraine    "used to have once/month; none in years" (01/06/2013)  . Palpitations   . Pneumonia ~ 1944; ~ 2009  . Tinnitus   . Vertigo     Past Surgical History:  Procedure Laterality Date  . CATARACT EXTRACTION EXTRACAPSULAR Bilateral 2015  . COLONOSCOPY N/A 01/07/2013   Procedure: COLONOSCOPY;  Surgeon: Cleotis Nipper, MD;  Location: [facility] ENDOSCOPY;  Service: Endoscopy;  Laterality: N/A;  The colonoscopy will be unprepped  . DILATION AND CURETTAGE OF UTERUS  2002   w/hysteroscopy  . ESOPHAGOGASTRODUODENOSCOPY N/A 01/07/2013   Procedure: ESOPHAGOGASTRODUODENOSCOPY (EGD);  Surgeon: Cleotis Nipper, MD;  Location: [facility] ENDOSCOPY;  Service: Endoscopy;  Laterality: N/A;  . HYSTEROSCOPY  2002   D&C(sub fibroid)   . HYSTEROSCOPY  2006   resection polyps D&C  . THYROIDECTOMY, PARTIAL     "removed gland from left side of neck that was to go down and past thyroid in front" (01/06/2013)  . TONSILLECTOMY     "  when I was little" (01/06/2013)  . TUBAL LIGATION Bilateral 1970's  . TUMOR REMOVAL  1990's & 06/23/2012, 02/2014   "benign; roof of mouth" (01/06/2013)    Current Outpatient Medications  Medication Sig Dispense Refill  . amoxicillin (AMOXIL) 500 MG capsule Take 1 capsule by mouth as needed (AS NEEDED FOR DENTAL PROCEDURES).   0  . donepezil (ARICEPT) 5 MG tablet Take 5 mg by mouth daily.    Marland Kitchen escitalopram (LEXAPRO) 10 MG tablet Take 10 mg by mouth daily.     Marland Kitchen gabapentin (NEURONTIN) 100 MG capsule Take 100 mg by mouth daily.    Marland Kitchen galantamine (RAZADYNE ER) 16 MG 24 hr capsule   10  . hydrochlorothiazide (HYDRODIURIL) 25 MG tablet TAKE 1 TABLET BY MOUTH ONCE DAILY 90 tablet 2  . losartan (COZAAR) 50 MG tablet Take 1 tablet (50 mg total) by mouth daily. 30 tablet 11  . metoprolol succinate (TOPROL-XL) 50 MG 24 hr tablet Take by mouth daily. Takes 1/2  10   . Multiple Vitamin (MULITIVITAMIN WITH MINERALS) TABS Take 1 tablet by mouth daily.    . potassium chloride (K-DUR) 10 MEQ tablet Take 1 tablet (10 mEq total) by mouth daily. 30 tablet 0  . rivastigmine (EXELON) 4.6 mg/24hr Place 1 patch onto the skin daily.  1  . rosuvastatin (CRESTOR) 20 MG tablet Take 20 mg by mouth daily.   10  . triamterene-hydrochlorothiazide (MAXZIDE-25) 37.5-25 MG per tablet Take 1 tablet by mouth daily.      No current facility-administered medications for this visit.      ALLERGIES: Advil [ibuprofen]; Ibuprofen; Metronidazole; and Ondansetron  Family History  Problem Relation Age of Onset  . Bone cancer Father   . Hypertension Father   . Thyroid disease Father        hypo  . Brain cancer Brother   . Arrhythmia Mother   . Osteoporosis Mother   . Cancer Mother        colon  . Diabetes Mother   . Hypertension Mother     Social History   Socioeconomic History  . Marital status: Married    Spouse name: Not on file  . Number of children: Not on file  . Years of education: Not on file  . Highest education level: Not on file  Occupational History  . Not on file  Social Needs  . Financial resource strain: Not on file  . Food insecurity:    Worry: Not on file    Inability: Not on file  . Transportation needs:    Medical: Not on file    Non-medical: Not on file  Tobacco Use  . Smoking status: Never Smoker  . Smokeless tobacco: Never Used  Substance and Sexual Activity  . Alcohol use: No    Alcohol/week: 0.0 standard drinks  . Drug use: No  . Sexual activity: Yes    Partners: Male    Birth control/protection: Post-menopausal  Lifestyle  . Physical activity:    Days per week: Not on file    Minutes per session: Not on file  . Stress: Not on file  Relationships  . Social connections:    Talks on phone: Not on file    Gets together: Not on file    Attends religious service: Not on file    Active member of club or organization: Not on file     Attends meetings of clubs or organizations: Not on file    Relationship status: Not on file  .  Intimate partner violence:    Fear of current or ex partner: Not on file    Emotionally abused: Not on file    Physically abused: Not on file    Forced sexual activity: Not on file  Other Topics Concern  . Not on file  Social History Narrative  . Not on file    Review of Systems  PHYSICAL EXAMINATION:    BP 120/66 (BP Location: Right Arm, Patient Position: Sitting, Cuff Size: Normal)   Pulse 66   Ht 5' 2.5" (1.588 m)   Wt 103 lb 9.6 oz (47 kg)   LMP 06/23/2010 (Approximate)   BMI 18.65 kg/m     General appearance: alert, cooperative and appears stated age Head: Normocephalic, without obvious abnormality, atraumatic Lungs: clear to auscultation bilaterally Heart: regular rate and rhythm.  Systolic murmur noted.  Abdomen: soft, non-tender, no masses,  no organomegaly Back:  No CVA tenderness.  Pelvic:  deferred.   Chaperone was present for exam.  ASSESSMENT  UTI.  PLAN  Urine culture.  Bactrim DS po bid x 3 days.  Pyridium 100 mg po tid prn for 3 days.  Hydrate well.  Return if not improved in 48 hours.    An After Visit Summary was printed and given to the patient.  __15____ minutes face to face time of which over 50% was spent in counseling.

## 2018-09-11 LAB — URINE CULTURE

## 2018-09-12 ENCOUNTER — Telehealth: Payer: Self-pay | Admitting: *Deleted

## 2018-09-12 NOTE — Telephone Encounter (Signed)
-----   Message from Nunzio Cobbs, MD sent at 09/11/2018  5:08 PM EST ----- Please report results of UC showing E Coli.  The Bactrim DS should treat this well.  Please let me know if she is not improving.

## 2018-09-12 NOTE — Telephone Encounter (Signed)
Notes recorded by Burnice Logan, RN on 09/12/2018 at 11:33 AM EST Left message on mobile number to call Sharee Pimple, RN at [place].  Call to home number, no answer, voicemail did not pick up.

## 2018-09-13 ENCOUNTER — Other Ambulatory Visit (HOSPITAL_COMMUNITY): Payer: Self-pay | Admitting: *Deleted

## 2018-09-13 NOTE — Telephone Encounter (Signed)
Spoke with patient, advised as seen below per Dr. Quincy Simmonds. Patient reports symptoms have resolved. Patient verbalizes understanding and is agreeable. Encounter Closed.

## 2018-09-16 ENCOUNTER — Telehealth: Payer: Self-pay | Admitting: *Deleted

## 2018-09-16 ENCOUNTER — Ambulatory Visit (HOSPITAL_COMMUNITY)
Admission: RE | Admit: 2018-09-16 | Discharge: 2018-09-16 | Disposition: A | Discharge status: Home / Self Care | Payer: Medicare Other | Source: Ambulatory Visit | Attending: Internal Medicine | Admitting: Internal Medicine

## 2018-09-16 DIAGNOSIS — M810 Age-related osteoporosis without current pathological fracture: Secondary | ICD-10-CM | POA: Insufficient documentation

## 2018-09-16 MED ORDER — ZOLEDRONIC ACID 5 MG/100ML IV SOLN
INTRAVENOUS | Status: AC
  Administered 2018-09-16: 5 mg via INTRAVENOUS
  Filled 2018-09-16: qty 100

## 2018-09-16 MED ORDER — ZOLEDRONIC ACID 5 MG/100ML IV SOLN
5.0000 mg | Freq: Once | INTRAVENOUS | Status: AC
  Administered 2018-09-16: 5 mg via INTRAVENOUS

## 2018-09-16 NOTE — Telephone Encounter (Signed)
Left message to call Sharee Pimple, RN at [place].    Call to check on patient.   Refill request received from [place] for Bactrim DS.   09/09/18 Urine culture E Coli. Patient treated, patient reported symptoms resolved. See result note.

## 2018-09-16 NOTE — Discharge Instructions (Signed)

## 2018-09-23 DIAGNOSIS — G629 Polyneuropathy, unspecified: Secondary | ICD-10-CM | POA: Diagnosis not present

## 2018-09-23 DIAGNOSIS — E876 Hypokalemia: Secondary | ICD-10-CM | POA: Diagnosis not present

## 2018-09-23 DIAGNOSIS — R110 Nausea: Secondary | ICD-10-CM | POA: Diagnosis not present

## 2018-09-23 DIAGNOSIS — F0390 Unspecified dementia without behavioral disturbance: Secondary | ICD-10-CM | POA: Diagnosis not present

## 2018-09-27 ENCOUNTER — Ambulatory Visit: Payer: Medicare Other | Admitting: Certified Nurse Midwife

## 2018-10-01 ENCOUNTER — Ambulatory Visit
Admission: RE | Admit: 2018-10-01 | Discharge: 2018-10-01 | Disposition: A | Discharge status: Home / Self Care | Payer: Medicare Other | Source: Ambulatory Visit | Attending: Internal Medicine | Admitting: Internal Medicine

## 2018-10-01 DIAGNOSIS — Z1231 Encounter for screening mammogram for malignant neoplasm of breast: Secondary | ICD-10-CM | POA: Diagnosis not present

## 2018-10-02 ENCOUNTER — Ambulatory Visit (INDEPENDENT_AMBULATORY_CARE_PROVIDER_SITE_OTHER): Payer: Medicare Other | Admitting: Certified Nurse Midwife

## 2018-10-02 ENCOUNTER — Encounter: Payer: Self-pay | Admitting: Certified Nurse Midwife

## 2018-10-02 ENCOUNTER — Other Ambulatory Visit: Payer: Self-pay

## 2018-10-02 VITALS — BP 120/80 | HR 68 | Resp 16 | Ht 62.5 in | Wt 100.0 lb

## 2018-10-02 DIAGNOSIS — Z124 Encounter for screening for malignant neoplasm of cervix: Secondary | ICD-10-CM

## 2018-10-02 DIAGNOSIS — Z01419 Encounter for gynecological examination (general) (routine) without abnormal findings: Secondary | ICD-10-CM

## 2018-10-02 DIAGNOSIS — Z0000 Encounter for general adult medical examination without abnormal findings: Secondary | ICD-10-CM | POA: Diagnosis not present

## 2018-10-02 DIAGNOSIS — Z780 Asymptomatic menopausal state: Secondary | ICD-10-CM

## 2018-10-02 LAB — POCT URINALYSIS DIPSTICK
Bilirubin, UA: NEGATIVE
Blood, UA: NEGATIVE
Glucose, UA: NEGATIVE
KETONES UA: NEGATIVE
Leukocytes, UA: NEGATIVE
Nitrite, UA: NEGATIVE
PH UA: 5 (ref 5.0–8.0)
Protein, UA: NEGATIVE
UROBILINOGEN UA: NEGATIVE U/dL — AB

## 2018-10-02 NOTE — Telephone Encounter (Signed)
Patient seen in office today, 10/02/18, for AEX.    Encounter closed.

## 2018-10-02 NOTE — Progress Notes (Signed)
77 y.o. G63P2002 Married  Caucasian Fe here for annual exam. Post menopausal. Denies vaginal bleeding or vaginal dryness. Sees Dr. Abner Greenspan for aex exam, labs, anxiety, cholesterol, hypertension and memory changes. Eating less and has lost weight over the past year. Forgets to eat sometimes, but spouse is helpful with meals. No other concerns today.  Patient's last menstrual period was 06/23/2010 (approximate).          Sexually active: No.  The current method of family planning is post menopausal status.    Exercising: No.  exercise Smoker:  no  Review of Systems  Constitutional: Negative.   HENT: Negative.   Eyes: Negative.   Respiratory: Negative.   Cardiovascular: Negative.   Gastrointestinal: Negative.   Genitourinary: Negative.   Musculoskeletal: Negative.   Skin: Negative.   Neurological: Negative.   Endo/Heme/Allergies: Negative.   Psychiatric/Behavioral: Negative.     Health Maintenance: Pap:  07-26-16 neg History of Abnormal Pap: no MMG:  10-01-18 category c density birads 1:neg Self Breast exams: yes Colonoscopy:  2014 f/u 2019 BMD:   2017 pcp manages TDaP:  With PCP Shingles: 2018 Pneumonia: had done Hep C and HIV: not done Labs: poct urine-neg   reports that she has never smoked. She has never used smokeless tobacco. She reports that she does not drink alcohol or use drugs.  Past Medical History:  Diagnosis Date  . Anxiety   . Aortic insufficiency   . Arthritis    "hands" (01/06/2013)  . Basal cell carcinoma of skin   . Bicuspid aortic valve   . Bicuspid aortic valve   . Cataract    right  . Colitis, collagenous summer of 2013  . Colon polyp   . DDD (degenerative disc disease) 12/2009  . GERD (gastroesophageal reflux disease)    "/endoscopy; doesn't bother me at all" (01/06/2013)  . Hard of hearing    bilat  . Heart murmur   . Hx of migraines   . Hyperlipidemia   . Hypertension   . Left facial numbness    previous, not current  . Migraine    "used  to have once/month; none in years" (01/06/2013)  . Palpitations   . Pneumonia ~ 1944; ~ 2009  . Tinnitus   . Vertigo     Past Surgical History:  Procedure Laterality Date  . CATARACT EXTRACTION EXTRACAPSULAR Bilateral 2015  . COLONOSCOPY N/A 01/07/2013   Procedure: COLONOSCOPY;  Surgeon: Cleotis Nipper, MD;  Location: [facility] ENDOSCOPY;  Service: Endoscopy;  Laterality: N/A;  The colonoscopy will be unprepped  . DILATION AND CURETTAGE OF UTERUS  2002   w/hysteroscopy  . ESOPHAGOGASTRODUODENOSCOPY N/A 01/07/2013   Procedure: ESOPHAGOGASTRODUODENOSCOPY (EGD);  Surgeon: Cleotis Nipper, MD;  Location: [facility] ENDOSCOPY;  Service: Endoscopy;  Laterality: N/A;  . HYSTEROSCOPY  2002   D&C(sub fibroid)   . HYSTEROSCOPY  2006   resection polyps D&C  . THYROIDECTOMY, PARTIAL     "removed gland from left side of neck that was to go down and past thyroid in front" (01/06/2013)  . TONSILLECTOMY     "when I was little" (01/06/2013)  . TUBAL LIGATION Bilateral 1970's  . TUMOR REMOVAL  1990's & 06/23/2012, 02/2014   "benign; roof of mouth" (01/06/2013)    Current Outpatient Medications  Medication Sig Dispense Refill  . amoxicillin (AMOXIL) 500 MG capsule Take 1 capsule by mouth as needed (AS NEEDED FOR DENTAL PROCEDURES).   0  . donepezil (ARICEPT) 5 MG tablet Take 5 mg by mouth daily.    Marland Kitchen  escitalopram (LEXAPRO) 10 MG tablet Take 10 mg by mouth daily.     Marland Kitchen gabapentin (NEURONTIN) 100 MG capsule Take 100 mg by mouth daily.    Marland Kitchen galantamine (RAZADYNE ER) 16 MG 24 hr capsule   10  . hydrochlorothiazide (HYDRODIURIL) 25 MG tablet TAKE 1 TABLET BY MOUTH ONCE DAILY 90 tablet 2  . losartan (COZAAR) 50 MG tablet Take 1 tablet (50 mg total) by mouth daily. 30 tablet 11  . metoprolol succinate (TOPROL-XL) 50 MG 24 hr tablet Take by mouth daily. Takes 1/2  10  . Multiple Vitamin (MULITIVITAMIN WITH MINERALS) TABS Take 1 tablet by mouth daily.    . phenazopyridine (PYRIDIUM) 100 MG tablet Take 1 tablet (100 mg total)  by mouth 3 (three) times daily as needed for pain. Do not take for more than 3 days. 10 tablet 0  . potassium chloride (K-DUR) 10 MEQ tablet Take 1 tablet (10 mEq total) by mouth daily. 30 tablet 0  . rivastigmine (EXELON) 4.6 mg/24hr Place 1 patch onto the skin daily.  1  . rosuvastatin (CRESTOR) 20 MG tablet Take 20 mg by mouth daily.   10  . sulfamethoxazole-trimethoprim (BACTRIM DS) 800-160 MG tablet Take 1 tablet by mouth 2 (two) times daily. One PO BID x 3 days 6 tablet 0  . triamterene-hydrochlorothiazide (MAXZIDE-25) 37.5-25 MG per tablet Take 1 tablet by mouth daily.      No current facility-administered medications for this visit.     Family History  Problem Relation Age of Onset  . Bone cancer Father   . Hypertension Father   . Thyroid disease Father        hypo  . Brain cancer Brother   . Arrhythmia Mother   . Osteoporosis Mother   . Cancer Mother        colon  . Diabetes Mother   . Hypertension Mother     ROS:  Pertinent items are noted in HPI.  Otherwise, a comprehensive ROS was negative.  Exam:   LMP 06/23/2010 (Approximate)    Ht Readings from Last 3 Encounters:  09/16/18 5\' 3"  (1.6 m)  09/09/18 5' 2.5" (1.588 m)  09/06/18 5' 2.5" (1.588 m)    General appearance: alert, cooperative and appears stated age Head: Normocephalic, without obvious abnormality, atraumatic Neck: no adenopathy, supple, symmetrical, trachea midline and thyroid normal to inspection and palpation Lungs: clear to auscultation bilaterally Breasts: normal appearance, no masses or tenderness, No nipple retraction or dimpling, No nipple discharge or bleeding, No axillary or supraclavicular adenopathy Heart: regular rate and rhythm Abdomen: soft, non-tender; no masses,  no organomegaly Extremities: extremities normal, atraumatic, no cyanosis or edema Skin: Skin color, texture, turgor normal. No rashes or lesions Lymph nodes: Cervical, supraclavicular, and axillary nodes normal. No abnormal  inguinal nodes palpated Neurologic: Grossly normal   Pelvic: External genitalia:  no lesions              Urethra:  normal appearing urethra with no masses, tenderness or lesions              Bartholin's and Skene's: normal                 Vagina: normal appearing vagina with normal color and discharge, no lesions              Cervix: no cervical motion tenderness, no lesions and normal appearance              Pap taken: No. Bimanual Exam:  Uterus:  normal size, contour, position, consistency, mobility, non-tender and anteverted              Adnexa: normal adnexa and no mass, fullness, tenderness               Rectovaginal: Confirms               Anus:  normal sphincter tone, no lesions  Chaperone present: yes  A:  Well Woman with normal exam  Post menopausal  Memory change per patient  Weight loss of 12 pounds since last exam here  P:   Reviewed health and wellness pertinent to exam  Aware to advise if vaginal bleeding  Discussed memory challenges daily  Given ideas to help with food intake daily, to help maintain.  Pap smear: no   counseled on breast self exam, mammography screening, feminine hygiene, adequate intake of calcium and vitamin D, diet and exercise  return annually or prn  An After Visit Summary was printed and given to the patient.

## 2018-10-02 NOTE — Patient Instructions (Signed)

## 2018-10-07 DIAGNOSIS — R110 Nausea: Secondary | ICD-10-CM | POA: Diagnosis not present

## 2018-10-07 DIAGNOSIS — Z7189 Other specified counseling: Secondary | ICD-10-CM | POA: Diagnosis not present

## 2018-10-07 DIAGNOSIS — Z5181 Encounter for therapeutic drug level monitoring: Secondary | ICD-10-CM | POA: Diagnosis not present

## 2018-10-08 ENCOUNTER — Other Ambulatory Visit: Payer: Self-pay

## 2018-10-08 NOTE — Patient Outreach (Signed)
[place] [facility]) Care Management  10/08/2018  Bailey [name] 1941/07/29 [id number]   Telephone Screen  Referral Date: 10/08/18 Referral Source: MD office Referral Reason: " medication reconciliation including printout from pharmacy and med rec from PCP office" Insurance: [facility]   Outreach attempt # 1 to patient. Spoke with patient who reported she was "at the shop" and unable to talk at present. Advised that RN CM would call patient back another time.       Plan: RN CM will make outreach attempt to patient within 3-4 business days.   Enzo Montgomery, RN,BSN,CCM [place] Management Telephonic Care Management Coordinator Direct Phone: [phone] Toll Free: [phone] Fax: [phone]

## 2018-10-10 ENCOUNTER — Other Ambulatory Visit: Payer: Self-pay

## 2018-10-10 NOTE — Patient Outreach (Signed)
[place] [facility]) Care Management  10/10/2018  Bailey [name] [birth year] [id number]   Telephone Screen  Referral Date: 10/08/18 Referral Source: MD office Referral Reason: " medication reconciliation including printout from pharmacy and med rec from PCP office" Insurance: [facility]   Outreach attempt #2 to patient. Spoke with both patient and spouse. Discussed and reviewed referral source and reason with them. Patient states she was unaware of MD placing referral but spouse states it was mentioned at last MD appt. Patient voices that she is on eight prescriptions meds and takes OTC multivitamin. They both deny any issues as far as affording meds. Spouse states that MD wanted a pharmacy to review meds to ensure patient taking meds as ordered and taking all meds prescribed. He voices that he assists spouse in managing meds. Patient is independent with ADLs/IADLs. No recent falls and no assistive devices used. Spouse takes patient to appts. Per chart review, patient has PMH that includes HLD, migraines,HTN, osteopenia, stress urinary incontinence, dementia, anxiety, neck pain, peripheral neuropathy and insomnia. Patient denies needing any assistance or education in managing chronic conditions. RN CM reviewed and discussed [facility] services with spouse and patient. Verbal consent for [facility] services given. They voice no further needs or concerns at this time other than pharmacy assistance.       Plan: RN CM will send [place] referral for med reconciliation and possible assistance with med mgmt.   Enzo Montgomery, RN,BSN,CCM [place] Management Telephonic Care Management Coordinator Direct Phone: [phone] Toll Free: [phone] Fax: [phone]

## 2018-10-11 ENCOUNTER — Ambulatory Visit: Payer: Medicare Other

## 2018-10-11 ENCOUNTER — Other Ambulatory Visit: Payer: Medicare Other

## 2018-10-14 ENCOUNTER — Other Ambulatory Visit: Payer: Self-pay | Admitting: Pharmacist

## 2018-10-14 NOTE — Patient Outreach (Addendum)
[place] [facility]) Care Management  [place]   10/14/2018  Bailey [name] August 21, 1941 [id number]  Reason for referral: Medication Management, Medication Reconciliation  Referral source: Dr. Joylene Draft Current insurance:[employer] [facility]  PMHx includes but not limited to:  HTN, HLD, dizziness, Alzheimer's dementia, Crohn's  Outreach:  Successful telephone call with Bailey [name].  HIPAA identifiers verified.   Subjective:  Patient agreeable to review medications.  She reports she uses pillbottles at home but has a pillbox for when she travels.  She states she is currently picking up 30 day supply.  She asks about side effects from Exelon patch and states she doesn't feel "quite right" after starting it, reporting tremors.    Objective: Lab Results  Component Value Date   CREATININE 1.07 (H) 08/15/2017   CREATININE 0.97 01/09/2013   CREATININE 1.19 (H) 01/08/2013    Lab Results  Component Value Date   HGBA1C 5.1 08/15/2017    Lipid Panel  No results found for: CHOL, TRIG, HDL, CHOLHDL, VLDL, LDLCALC, LDLDIRECT  BP Readings from Last 3 Encounters:  10/02/18 120/80  09/16/18 (!) 151/66  09/09/18 120/66    Allergies  Allergen Reactions  . Advil [Ibuprofen]     vomiting  . Ibuprofen Nausea And Vomiting    vertigo  . Metronidazole Diarrhea and Nausea And Vomiting  . Ondansetron Nausea And Vomiting    Medications Reviewed Today    Reviewed by Regina Eck, CNM (Certified Nurse Midwife) on 10/02/18 at 1522  Med List Status: <None>  Medication Order Taking? Sig Documenting Provider Last Dose Status Informant  amoxicillin (AMOXIL) 500 MG capsule [id number] No Take 1 capsule by mouth as needed (AS NEEDED FOR DENTAL PROCEDURES).  [provider] Not Taking Active            Med Note Olena Heckle, MICHELE   Thu Sep 06, 2017 10:12 AM)    budesonide (ENTOCORT EC) 3 MG 24 hr capsule [id number] Yes  [provider] Taking Active    donepezil (ARICEPT) 5 MG tablet [id number] Yes Take 5 mg by mouth daily. [provider] Taking Active   escitalopram (LEXAPRO) 10 MG tablet [id number] Yes Take 10 mg by mouth daily.  [provider] Taking Active   gabapentin (NEURONTIN) 100 MG capsule [id number] Yes Take 100 mg by mouth daily. [provider] Taking Active Self  galantamine (RAZADYNE ER) 16 MG 24 hr capsule [id number] Yes  [provider] Taking Active   hydrochlorothiazide (HYDRODIURIL) 25 MG tablet [id number] Yes TAKE 1 TABLET BY MOUTH ONCE DAILY Nahser, Wonda Cheng, MD Taking Active   losartan (COZAAR) 50 MG tablet [id number] Yes Take 1 tablet (50 mg total) by mouth daily. Nahser, Wonda Cheng, MD Taking Active   metoprolol succinate (TOPROL-XL) 50 MG 24 hr tablet [id number] Yes Take by mouth daily. Takes 1/2 [provider] Taking Active            Med Note Darryll Capers Sep 06, 2017 10:12 AM)    Multiple Vitamin (MULITIVITAMIN WITH MINERALS) TABS [id number] Yes Take 1 tablet by mouth daily. [provider] Taking Active Self  rivastigmine (EXELON) 9.5 mg/24hr [id number] Yes  [provider] Taking Active   rosuvastatin (CRESTOR) 20 MG tablet [id number] Yes Take 20 mg by mouth daily.  [provider] Taking Active   triamterene-hydrochlorothiazide (MAXZIDE-25) 37.5-25 MG per tablet [id number] Yes Take 1 tablet by mouth daily.  [provider] Taking Active  Assessment:  Drugs sorted by system:  Neurologic/Psychologic:escitalopram, rivastigmine  Cardiovascular:HCTZ, losartan, metoprolol succinate, rosuvastatin  Gastrointestinal:budesonide  Infectious Diseases: PRN amoxicillin (dental procedures)  Vitamins/Minerals/Supplements:MVI, potassium  Medication Review Findings:  -No longer taking:  Donepezil, gabapentin, galantamine, triamterene-HCTZ.  Removed these medications from patient's profile.   -Discussed side effects from  transdermal exelon patch (tremor 7% with transdermal).    Medication Assistance Findings:  No medication assistance needs identified   Plan: Will contact Dr. Joylene Draft regarding rivastigmine and to confirm medication list. .    Ralene Bathe, PharmD, [place] [phone]   Addendum: RN [name] from Dr. Silvestre Mesi office returned call.  Reviewed medication list from office with medications stated by patient.  Per office, active medications that are missing: Vitamin B12, Vitamin D, gabapentin PRN, biotin.  RN will send message to Dr. Joylene Draft for assessment on medications and tremor possibly related to Exelon patch.   Incoming voicemail from Dr. Silvestre Mesi office: Per MD, ok to stay off gabapentin, Vitamin B12, Vitamin D.  Refills called in for several medications.  Prefers for patient to stay on Exelon patch however if patient cannot tolerate, ok to stop.  Ok to continue on Exelon, metoprolol, losartan, MVI.

## 2018-10-15 ENCOUNTER — Other Ambulatory Visit: Payer: Self-pay | Admitting: Pharmacist

## 2018-10-15 NOTE — Patient Outreach (Addendum)
[place] [name]) Care Management  [place] 10/15/2018  Bailey [name] December 04, 1940 [id number]  Reason for call: F/u call to Dr. Silvestre Mesi office   Message left to clarify: If patient cannot tolerate Exelon patch at current dose 9.5mg /24h, option to decrease strength? 4 medications noted to continue, but HCTZ, escitalopram, budesonide, potassium, rosuvastatin not mentioned.  Are these still current or prescribed by other providers?   Will wait to hear back from Dr. Wilnette Kales, PharmD, [place] [phone]   Addendum Incoming call from provider office.  RN states Dr. Joylene Draft is out of the office until Jan 1st.  Unclear if HCTZ, escitalopram, budesonide, potassium, rosuvastatin are to be continued.  RN will leave another message to clarify.  Exelon patch dose decrease not an option.    Call placed to Bailey [name] to provide update and that I will follow-up again with her on Jan 2nd to make sure provider has clarified her medication list.  Patient voiced understanding.   Plan: F/u with patient on Jan 2nd 2020.   Ralene Bathe, PharmD, [place] [phone]

## 2018-10-24 ENCOUNTER — Telehealth: Payer: Self-pay | Admitting: Nurse Practitioner

## 2018-10-24 ENCOUNTER — Other Ambulatory Visit: Payer: Self-pay | Admitting: Pharmacist

## 2018-10-24 ENCOUNTER — Ambulatory Visit: Payer: Self-pay | Admitting: Pharmacist

## 2018-10-24 NOTE — Patient Outreach (Signed)
[place] [facility]) Care Management  [place]  10/24/2018  Bailey [name] 21-Apr-1941 [id number]   Reason for call: f/u on medication list from provider  Unsuccessful telephone call attempt #1 to patient.   HIPAA compliant voicemail left requesting a return call  Plan:  I will make another outreach attempt to patient within 3-4 business days   Ralene Bathe, PharmD, [place] [phone]

## 2018-10-24 NOTE — Telephone Encounter (Signed)
Patient was scheduled for nurse visit and lab work on 12/20 for evaluation of BP and bmet after starting Losartan 50 mg on 11/15; she did not show up. I left a message for her to call back to discuss follow-up

## 2018-10-28 ENCOUNTER — Other Ambulatory Visit: Payer: Self-pay | Admitting: Pharmacist

## 2018-10-28 NOTE — Patient Outreach (Signed)
[place] [facility]) Care Management  [place] 10/28/2018  Bailey [name] [birth year] [id number]  Incoming call received from Dr. Silvestre Mesi office.  RN states that MD has verified patient to stop the following: Lexapro, HCTZ, and Crestor.  She may continue taking: Losartan, Metoprolol, Exelon patch, and MVI.    Successful call to [name] today.  I updated patient on instructions from Dr. Silvestre Mesi office.  Patient stated she will discard HCTZ and Crestor immediately and will taper off Lexapro over 2 weeks by going down to 1 tablet every other day.  She stated she had stopped taking the Exelon patch but did not remember why.  She will start taking it again and will monitor for the return of tremors.  She is aware she should discuss any further changes with the Exelon patch directly with Dr. Joylene Draft.  No further medication questions or concerns from patient at this time.    Plan: I will close [name] pharmacy case at this time. Patient has been provided [name] CM contact information if assistance needed in the future.    Thank you for allowing [name] pharmacy to be involved in this patient's care.    Ralene Bathe, PharmD, [place] [phone]

## 2018-10-29 ENCOUNTER — Ambulatory Visit: Payer: Self-pay | Admitting: Pharmacist

## 2018-11-04 DIAGNOSIS — Z7189 Other specified counseling: Secondary | ICD-10-CM | POA: Diagnosis not present

## 2018-11-04 DIAGNOSIS — K2970 Gastritis, unspecified, without bleeding: Secondary | ICD-10-CM | POA: Diagnosis not present

## 2018-11-04 DIAGNOSIS — Z681 Body mass index (BMI) 19 or less, adult: Secondary | ICD-10-CM | POA: Diagnosis not present

## 2018-11-04 DIAGNOSIS — R110 Nausea: Secondary | ICD-10-CM | POA: Diagnosis not present

## 2018-11-11 ENCOUNTER — Other Ambulatory Visit: Payer: Self-pay | Admitting: Pharmacist

## 2018-11-11 NOTE — Patient Outreach (Signed)
[facility]) Care Management  [place] 11/11/2018  Bailey [name] May 26, 1941 [id number]  Reason for call: f/u that patient has made recommended changes by Dr. Joylene Draft to medications  Successful call to Bailey [name].  Patient reports she has stopped HCTZ and Crestor but has continued to take Lexapro.  I reviewed with her again that Dr. Joylene Draft recommended to stop taking this.  Patient reports she now remembers this and will put it "away."  We reviewed the rest of her medications as well.  Patient has appt with GI this week and with Dr. Joylene Draft in the next few weeks and will bring all medications to both appointments to make sure she is taking all prescribed medications.  No further questions from patient.   Plan: Will keep [facility] case closed.  Am happy to assist in the future as needed.   Ralene Bathe, PharmD, [place] [phone]

## 2018-11-12 NOTE — Telephone Encounter (Signed)
I spoke with patient and asked how she is doing since starting Losartan 50 mg as advised by Dr. Acie Fredrickson at office visit on 11/15. Patient states she is not taking the medications and does not have the pill bottle at home with her. I advised I will call her pharmacy to verify receipt. I spoke with [facility] and they advised patient has not picked up Losartan 50 mg. I returned call to patient and she states she found the pill bottle turned upside down on her counter. I asked her to verify that this is the Losartan 50 mg and if not, to pick up the medication at [place] and start therapy today. Patient has an office visit scheduled with Dr. Acie Fredrickson on 2/17 and is aware we will follow-up on this therapy at that time. She verbalized understanding and agreement and thanked me for the call.

## 2018-11-13 ENCOUNTER — Other Ambulatory Visit: Payer: Self-pay | Admitting: Physician Assistant

## 2018-11-13 DIAGNOSIS — R630 Anorexia: Secondary | ICD-10-CM | POA: Diagnosis not present

## 2018-11-13 DIAGNOSIS — R634 Abnormal weight loss: Secondary | ICD-10-CM | POA: Diagnosis not present

## 2018-11-13 DIAGNOSIS — R110 Nausea: Secondary | ICD-10-CM | POA: Diagnosis not present

## 2018-11-13 DIAGNOSIS — K9089 Other intestinal malabsorption: Secondary | ICD-10-CM | POA: Diagnosis not present

## 2018-11-22 ENCOUNTER — Other Ambulatory Visit: Payer: Medicare Other

## 2018-11-29 ENCOUNTER — Ambulatory Visit
Admission: RE | Admit: 2018-11-29 | Discharge: 2018-11-29 | Disposition: A | Discharge status: Home / Self Care | Payer: Medicare Other | Source: Ambulatory Visit | Attending: Physician Assistant | Admitting: Physician Assistant

## 2018-11-29 DIAGNOSIS — K2930 Chronic superficial gastritis without bleeding: Secondary | ICD-10-CM | POA: Diagnosis not present

## 2018-11-29 DIAGNOSIS — R110 Nausea: Secondary | ICD-10-CM | POA: Diagnosis not present

## 2018-11-29 DIAGNOSIS — K210 Gastro-esophageal reflux disease with esophagitis: Secondary | ICD-10-CM | POA: Diagnosis not present

## 2018-11-29 DIAGNOSIS — K7689 Other specified diseases of liver: Secondary | ICD-10-CM | POA: Diagnosis not present

## 2018-11-29 DIAGNOSIS — R634 Abnormal weight loss: Secondary | ICD-10-CM

## 2018-11-29 DIAGNOSIS — K2980 Duodenitis without bleeding: Secondary | ICD-10-CM | POA: Diagnosis not present

## 2018-12-09 ENCOUNTER — Ambulatory Visit: Payer: Medicare Other | Admitting: Cardiovascular Disease

## 2018-12-10 ENCOUNTER — Encounter: Payer: Self-pay | Admitting: Cardiovascular Disease

## 2018-12-10 DIAGNOSIS — K210 Gastro-esophageal reflux disease with esophagitis: Secondary | ICD-10-CM | POA: Diagnosis not present

## 2018-12-10 DIAGNOSIS — K2930 Chronic superficial gastritis without bleeding: Secondary | ICD-10-CM | POA: Diagnosis not present

## 2018-12-10 DIAGNOSIS — K2980 Duodenitis without bleeding: Secondary | ICD-10-CM | POA: Diagnosis not present

## 2018-12-16 DIAGNOSIS — K2970 Gastritis, unspecified, without bleeding: Secondary | ICD-10-CM | POA: Diagnosis not present

## 2018-12-16 DIAGNOSIS — F0390 Unspecified dementia without behavioral disturbance: Secondary | ICD-10-CM | POA: Diagnosis not present

## 2018-12-16 DIAGNOSIS — I10 Essential (primary) hypertension: Secondary | ICD-10-CM | POA: Diagnosis not present

## 2018-12-16 DIAGNOSIS — Z7189 Other specified counseling: Secondary | ICD-10-CM | POA: Diagnosis not present

## 2018-12-19 ENCOUNTER — Other Ambulatory Visit: Payer: Self-pay | Admitting: *Deleted

## 2018-12-19 ENCOUNTER — Encounter: Payer: Self-pay | Admitting: Cardiovascular Disease

## 2018-12-19 ENCOUNTER — Ambulatory Visit (INDEPENDENT_AMBULATORY_CARE_PROVIDER_SITE_OTHER): Payer: Medicare Other | Admitting: Cardiovascular Disease

## 2018-12-19 VITALS — BP 125/72 | HR 53 | Ht 63.5 in | Wt 99.8 lb

## 2018-12-19 DIAGNOSIS — Q231 Congenital insufficiency of aortic valve: Secondary | ICD-10-CM | POA: Diagnosis not present

## 2018-12-19 DIAGNOSIS — I351 Nonrheumatic aortic (valve) insufficiency: Secondary | ICD-10-CM

## 2018-12-19 DIAGNOSIS — I10 Essential (primary) hypertension: Secondary | ICD-10-CM | POA: Diagnosis not present

## 2018-12-19 NOTE — Progress Notes (Signed)
Bailey [name] Date of Birth  1941/02/09             Problem List: 1. Bicuspid aortic valve / moderate aortic insufficiency 2. Dyslipidemia 3. Hypertension  History of Present Illness:  Bailey [name] is a 78 y.o. female with the above medical problems.  She has had chronic diarrhea and was found to have colitis. She is taking some medications and is getting better.  She is exercising some.  She has been spending a lot of time at the nursing home taking care of her mother. She's not been able to exercise as much she would like.  She's not had any cardiac problems doing her normal daily activities.  May 04, 2014:  Bailey [name] is doing ok.  She has had some fatigue.  Had  colitis.  She had acute renal failure and was very dehydrated.    May 04, 2015: Bailey [name] is doing well .  Still having problems with the colitis.  No CP or dyspnea.   May 04, 2016:  Bailey [name] is dong well  Followed a bicuspid AV with moderate AI .  Has dyslipidemia and  HTN BP is normal today  Some mild , atypical CP   September 06, 2017:  Bailey [name] is doing well.  She is seen back for follow-up of her high blood pressure and bicuspid aortic valve.  Had a skin cancer removed from right side of jaw  No CP or dyspnea. Not as much exercise as she should be getting .  Knows that she would benefit from going   September 06, 2018: Seen today for follow-up of her bicuspid aortic valve. No CP or dyspnea.  Getting some exercise Is losing weight Eats well  Wt today is 104 lbs   ( down from 112 lbs last year )   December 19, 2018:    Bailey [name] is seen today for a follow-up visit.  She has a history of bicuspid aortic valve with aortic insufficiency.  She continues to lose weight.  Her weight is down 5 pounds from her previous visit 3 months ago. She says she eats well  No CP or dyspnea    She is very pleasant.   It seems that she is having some memory issues   Current Outpatient Medications on File Prior to Visit    Medication Sig Dispense Refill  . amoxicillin (AMOXIL) 500 MG capsule Take 1 capsule by mouth as needed (AS NEEDED FOR DENTAL PROCEDURES).   0  . budesonide (ENTOCORT EC) 3 MG 24 hr capsule Take 9 mg by mouth daily.   0  . metoprolol succinate (TOPROL-XL) 50 MG 24 hr tablet Take 50 mg by mouth daily.   10  . Multiple Vitamin (MULITIVITAMIN WITH MINERALS) TABS Take 1 tablet by mouth daily.    . potassium chloride SA (K-DUR,KLOR-CON) 20 MEQ tablet Take 20 mEq by mouth daily.    . rivastigmine (EXELON) 9.5 mg/24hr   0   No current facility-administered medications on file prior to visit.     Allergies  Allergen Reactions  . Advil [Ibuprofen]     vomiting  . Ibuprofen Nausea And Vomiting    vertigo  . Metronidazole Diarrhea and Nausea And Vomiting  . Ondansetron Nausea And Vomiting    Past Medical History:  Diagnosis Date  . Anxiety   . Aortic insufficiency   . Arthritis    "hands" (01/06/2013)  . Basal cell carcinoma of skin   . Bicuspid aortic valve   . Bicuspid  aortic valve   . Cataract    right  . Colitis, collagenous summer of 2013  . Colon polyp   . DDD (degenerative disc disease) 12/2009  . GERD (gastroesophageal reflux disease)    "/endoscopy; doesn't bother me at all" (01/06/2013)  . Hard of hearing    bilat  . Heart murmur   . Hx of migraines   . Hyperlipidemia   . Hypertension   . Left facial numbness    previous, not current  . Migraine    "used to have once/month; none in years" (01/06/2013)  . Palpitations   . Pneumonia ~ 1944; ~ 2009  . Tinnitus   . Vertigo     Past Surgical History:  Procedure Laterality Date  . CATARACT EXTRACTION EXTRACAPSULAR Bilateral 2015  . COLONOSCOPY N/A 01/07/2013   Procedure: COLONOSCOPY;  Surgeon: Cleotis Nipper, MD;  Location: [facility] ENDOSCOPY;  Service: Endoscopy;  Laterality: N/A;  The colonoscopy will be unprepped  . DILATION AND CURETTAGE OF UTERUS  2002   w/hysteroscopy  . ESOPHAGOGASTRODUODENOSCOPY N/A 01/07/2013    Procedure: ESOPHAGOGASTRODUODENOSCOPY (EGD);  Surgeon: Cleotis Nipper, MD;  Location: [facility] ENDOSCOPY;  Service: Endoscopy;  Laterality: N/A;  . HYSTEROSCOPY  2002   D&C(sub fibroid)   . HYSTEROSCOPY  2006   resection polyps D&C  . THYROIDECTOMY, PARTIAL     "removed gland from left side of neck that was to go down and past thyroid in front" (01/06/2013)  . TONSILLECTOMY     "when I was little" (01/06/2013)  . TUBAL LIGATION Bilateral 1970's  . TUMOR REMOVAL  1990's & 06/23/2012, 02/2014   "benign; roof of mouth" (01/06/2013)    Social History   Tobacco Use  Smoking Status Never Smoker  Smokeless Tobacco Never Used    Social History   Substance and Sexual Activity  Alcohol Use No  . Alcohol/week: 0.0 standard drinks    Family History  Problem Relation Age of Onset  . Bone cancer Father   . Hypertension Father   . Thyroid disease Father        hypo  . Brain cancer Brother   . Arrhythmia Mother   . Osteoporosis Mother   . Cancer Mother        colon  . Diabetes Mother   . Hypertension Mother     Reviw of Systems:  Reviewed in the HPI.  All other systems are negative.   Physical Exam: Blood pressure 125/72, pulse (!) 53, height 5' 3.5" (1.613 m), weight 99 lb 12.8 oz (45.3 kg), last menstrual period 06/23/2010, SpO2 98 %.  GEN:  Well nourished, well developed in no acute distress HEENT: Normal NECK: No JVD; No carotid bruits LYMPHATICS: No lymphadenopathy CARDIAC: RRR ,  Soft systolic and diastolic murmur  RESPIRATORY:  Clear to auscultation without rales, wheezing or rhonchi  ABDOMEN: Soft, non-tender, non-distended MUSCULOSKELETAL:  No edema; No deformity  SKIN: Warm and dry NEUROLOGIC:  Alert and oriented x 3   ECG:    Assessment / Plan:   1. Bicuspid aortic valve / moderate aortic insufficiency    This seems to be stable     2. Dyslipidemia -    3. Hypertension -blood pressure is well controlled.  Continue current medications.  4.  Memory issues:    It seems like Bailey [name] is having some memory issues .  Will have Dr. Joylene Draft evaluate   Mertie Moores, MD  12/19/2018 5:28 PM    [place]  [address],  [place], [place]  [address] Pager [phone] Phone: [phone]; Fax: [phone]

## 2018-12-19 NOTE — Patient Instructions (Signed)

## 2018-12-26 DIAGNOSIS — R110 Nausea: Secondary | ICD-10-CM | POA: Diagnosis not present

## 2018-12-26 DIAGNOSIS — R630 Anorexia: Secondary | ICD-10-CM | POA: Diagnosis not present

## 2018-12-26 DIAGNOSIS — K9089 Other intestinal malabsorption: Secondary | ICD-10-CM | POA: Diagnosis not present

## 2018-12-26 DIAGNOSIS — R634 Abnormal weight loss: Secondary | ICD-10-CM | POA: Diagnosis not present

## 2019-01-03 DIAGNOSIS — I10 Essential (primary) hypertension: Secondary | ICD-10-CM | POA: Diagnosis not present

## 2019-01-03 DIAGNOSIS — Q231 Congenital insufficiency of aortic valve: Secondary | ICD-10-CM | POA: Diagnosis not present

## 2019-01-03 DIAGNOSIS — F0390 Unspecified dementia without behavioral disturbance: Secondary | ICD-10-CM | POA: Diagnosis not present

## 2019-01-03 DIAGNOSIS — K2970 Gastritis, unspecified, without bleeding: Secondary | ICD-10-CM | POA: Diagnosis not present

## 2019-04-02 DIAGNOSIS — Z7189 Other specified counseling: Secondary | ICD-10-CM | POA: Diagnosis not present

## 2019-04-02 DIAGNOSIS — R634 Abnormal weight loss: Secondary | ICD-10-CM | POA: Diagnosis not present

## 2019-05-22 DIAGNOSIS — F0390 Unspecified dementia without behavioral disturbance: Secondary | ICD-10-CM | POA: Diagnosis not present

## 2019-05-22 DIAGNOSIS — K2970 Gastritis, unspecified, without bleeding: Secondary | ICD-10-CM | POA: Diagnosis not present

## 2019-05-22 DIAGNOSIS — E876 Hypokalemia: Secondary | ICD-10-CM | POA: Diagnosis not present

## 2019-05-22 DIAGNOSIS — R634 Abnormal weight loss: Secondary | ICD-10-CM | POA: Diagnosis not present

## 2019-05-23 DIAGNOSIS — E876 Hypokalemia: Secondary | ICD-10-CM | POA: Diagnosis not present

## 2019-05-30 DIAGNOSIS — R634 Abnormal weight loss: Secondary | ICD-10-CM | POA: Diagnosis not present

## 2019-05-30 DIAGNOSIS — R110 Nausea: Secondary | ICD-10-CM | POA: Diagnosis not present

## 2019-05-30 DIAGNOSIS — K9089 Other intestinal malabsorption: Secondary | ICD-10-CM | POA: Diagnosis not present

## 2019-06-09 DIAGNOSIS — H5213 Myopia, bilateral: Secondary | ICD-10-CM | POA: Diagnosis not present

## 2019-07-07 NOTE — Progress Notes (Signed)
Bailey [name] Date of Birth  01-13-1941             Problem List: 1. Bicuspid aortic valve / moderate aortic insufficiency 2. Dyslipidemia 3. Hypertension    Previous notes:  Bailey [name] is a 78 y.o. female with the above medical problems.  She has had chronic diarrhea and was found to have colitis. She is taking some medications and is getting better.  She is exercising some.  She has been spending a lot of time at the nursing home taking care of her mother. She's not been able to exercise as much she would like.  She's not had any cardiac problems doing her normal daily activities.  May 04, 2014:  Bailey [name] is doing ok.  She has had some fatigue.  Had  colitis.  She had acute renal failure and was very dehydrated.    May 04, 2015: Bailey [name] is doing well .  Still having problems with the colitis.  No CP or dyspnea.   May 04, 2016:  Bailey [name] is dong well  Followed a bicuspid AV with moderate AI .  Has dyslipidemia and  HTN BP is normal today  Some mild , atypical CP   September 06, 2017:  Bailey [name] is doing well.  She is seen back for follow-up of her high blood pressure and bicuspid aortic valve.  Had a skin cancer removed from right side of jaw  No CP or dyspnea. Not as much exercise as she should be getting .  Knows that she would benefit from going   September 06, 2018: Seen today for follow-up of her bicuspid aortic valve. No CP or dyspnea.  Getting some exercise Is losing weight Eats well  Wt today is 104 lbs   ( down from 112 lbs last year )   December 19, 2018:    Bailey [name] is seen today for a follow-up visit.  She has a history of bicuspid aortic valve with aortic insufficiency.  She continues to lose weight.  Her weight is down 5 pounds from her previous visit 3 months ago. She says she eats well  No CP or dyspnea    She is very pleasant.   It seems that she is having some memory issues   Sept. 15, 2020 Bailey [name] is seen today for follow up of her aortic  insufficiency and bicuspid aortic valve  Doing well.  Not exercising much.  No CP or dyspnea.    Current Outpatient Medications on File Prior to Visit  Medication Sig Dispense Refill  . amoxicillin (AMOXIL) 500 MG capsule Take 1 capsule by mouth as needed (AS NEEDED FOR DENTAL PROCEDURES).   0  . budesonide (ENTOCORT EC) 3 MG 24 hr capsule Take 9 mg by mouth daily.   0  . escitalopram (LEXAPRO) 10 MG tablet Take 10 mg by mouth daily.     Marland Kitchen losartan (COZAAR) 50 MG tablet Take 1 tablet by mouth daily.    . Multiple Vitamin (MULITIVITAMIN WITH MINERALS) TABS Take 1 tablet by mouth daily.    . potassium chloride SA (K-DUR,KLOR-CON) 20 MEQ tablet Take 20 mEq by mouth daily.    . rivastigmine (EXELON) 9.5 mg/24hr   0   No current facility-administered medications on file prior to visit.     Allergies  Allergen Reactions  . Advil [Ibuprofen]     vomiting  . Ibuprofen Nausea And Vomiting    vertigo  . Metronidazole Diarrhea and Nausea And Vomiting  . Ondansetron Nausea And  Vomiting    Past Medical History:  Diagnosis Date  . Anxiety   . Aortic insufficiency   . Arthritis    "hands" (01/06/2013)  . Basal cell carcinoma of skin   . Bicuspid aortic valve   . Bicuspid aortic valve   . Cataract    right  . Colitis, collagenous summer of 2013  . Colon polyp   . DDD (degenerative disc disease) 12/2009  . GERD (gastroesophageal reflux disease)    "/endoscopy; doesn't bother me at all" (01/06/2013)  . Hard of hearing    bilat  . Heart murmur   . Hx of migraines   . Hyperlipidemia   . Hypertension   . Left facial numbness    previous, not current  . Migraine    "used to have once/month; none in years" (01/06/2013)  . Palpitations   . Pneumonia ~ 1944; ~ 2009  . Tinnitus   . Vertigo     Past Surgical History:  Procedure Laterality Date  . CATARACT EXTRACTION EXTRACAPSULAR Bilateral 2015  . COLONOSCOPY N/A 01/07/2013   Procedure: COLONOSCOPY;  Surgeon: Cleotis Nipper, MD;   Location: [facility] ENDOSCOPY;  Service: Endoscopy;  Laterality: N/A;  The colonoscopy will be unprepped  . DILATION AND CURETTAGE OF UTERUS  2002   w/hysteroscopy  . ESOPHAGOGASTRODUODENOSCOPY N/A 01/07/2013   Procedure: ESOPHAGOGASTRODUODENOSCOPY (EGD);  Surgeon: Cleotis Nipper, MD;  Location: [facility] ENDOSCOPY;  Service: Endoscopy;  Laterality: N/A;  . HYSTEROSCOPY  2002   D&C(sub fibroid)   . HYSTEROSCOPY  2006   resection polyps D&C  . THYROIDECTOMY, PARTIAL     "removed gland from left side of neck that was to go down and past thyroid in front" (01/06/2013)  . TONSILLECTOMY     "when I was little" (01/06/2013)  . TUBAL LIGATION Bilateral 1970's  . TUMOR REMOVAL  1990's & 06/23/2012, 02/2014   "benign; roof of mouth" (01/06/2013)    Social History   Tobacco Use  Smoking Status Never Smoker  Smokeless Tobacco Never Used    Social History   Substance and Sexual Activity  Alcohol Use No  . Alcohol/week: 0.0 standard drinks    Family History  Problem Relation Age of Onset  . Bone cancer Father   . Hypertension Father   . Thyroid disease Father        hypo  . Brain cancer Brother   . Arrhythmia Mother   . Osteoporosis Mother   . Cancer Mother        colon  . Diabetes Mother   . Hypertension Mother     Reviw of Systems:  Reviewed in the HPI.  All other systems are negative.  Physical Exam: Blood pressure 130/80, pulse (!) 51, height 5' 3.5" (1.613 m), weight 97 lb 6.4 oz (44.2 kg), last menstrual period 06/23/2010, SpO2 97 %.  GEN:  Well nourished, well developed in no acute distress HEENT: Normal NECK: No JVD; No carotid bruits LYMPHATICS: No lymphadenopathy CARDIAC: RRR .  Soft systolic and diastolic murmur  RESPIRATORY:  Clear to auscultation without rales, wheezing or rhonchi  ABDOMEN: Soft, non-tender, non-distended MUSCULOSKELETAL:  No edema; No deformity  SKIN: Warm and dry NEUROLOGIC:  Alert and oriented x 3    ECG: July 08, 2019: Sinus bradycardia 51  beats minute.  Occasional PACs.  Incomplete right bundle branch block.  Left anterior fascicular block.  Assessment / Plan:   1. Bicuspid aortic valve / moderate aortic insufficiency    AV appears to be bicuspid (  or at least functionaly bicuspid.  Overall doing well for age 31.   At this point, I doubt she will need to have this valve replaced   2. Dyslipidemia -   Managed by Dr. Joylene Draft   3. Hypertension - well controlled.   HR is slow.  Will decrease metoprolol to 25 mg a day   4.  Memory issues:  Seems to be stable    Mertie Moores, MD  07/08/2019 3:40 PM    [facility] [place],  [place], [place]  [address] Pager [phone] Phone: [phone]; Fax: [phone]

## 2019-07-08 ENCOUNTER — Encounter (INDEPENDENT_AMBULATORY_CARE_PROVIDER_SITE_OTHER): Payer: Self-pay

## 2019-07-08 ENCOUNTER — Other Ambulatory Visit: Payer: Self-pay

## 2019-07-08 ENCOUNTER — Encounter: Payer: Self-pay | Admitting: Cardiovascular Disease

## 2019-07-08 ENCOUNTER — Ambulatory Visit (INDEPENDENT_AMBULATORY_CARE_PROVIDER_SITE_OTHER): Payer: Medicare Other | Admitting: Cardiovascular Disease

## 2019-07-08 VITALS — BP 130/80 | HR 51 | Ht 63.5 in | Wt 97.4 lb

## 2019-07-08 DIAGNOSIS — I10 Essential (primary) hypertension: Secondary | ICD-10-CM | POA: Diagnosis not present

## 2019-07-08 DIAGNOSIS — Q231 Congenital insufficiency of aortic valve: Secondary | ICD-10-CM

## 2019-07-08 MED ORDER — METOPROLOL SUCCINATE ER 25 MG PO TB24: 25.0000 mg | ORAL_TABLET | Freq: Every day | ORAL | 3 refills | Status: DC

## 2019-07-08 NOTE — Patient Instructions (Signed)
Medication Instructions:  1) DECREASE Metoprolol Succinate to 25mg  once daily.  If you need a refill on your cardiac medications before your next appointment, please call your pharmacy.   Lab work: None If you have labs (blood work) drawn today and your tests are completely normal, you will receive your results only by: Marland Kitchen MyChart Message (if you have MyChart) OR . A paper copy in the mail If you have any lab test that is abnormal or we need to change your treatment, we will call you to review the results.  Testing/Procedures: None  Follow-Up: At [facility], you and your health needs are our priority.  As part of our continuing mission to provide you with exceptional heart care, we have created designated Provider Care Teams.  These Care Teams include your primary Cardiologist (physician) and Advanced Practice Providers (APPs -  Physician Assistants and Nurse Practitioners) who all work together to provide you with the care you need, when you need it. You will need a follow up appointment in:  12 months.  Please call our office 2 months in advance to schedule this appointment.  You may see Mertie Moores, MD or one of the following Advanced Practice Providers on your designated Care Team: Richardson Dopp, PA-C [place], [place] . Daune Perch, NP  Any Other Special Instructions Will Be Listed Below (If Applicable).

## 2019-07-16 DIAGNOSIS — L821 Other seborrheic keratosis: Secondary | ICD-10-CM | POA: Diagnosis not present

## 2019-07-16 DIAGNOSIS — Z85828 Personal history of other malignant neoplasm of skin: Secondary | ICD-10-CM | POA: Diagnosis not present

## 2019-07-16 DIAGNOSIS — L820 Inflamed seborrheic keratosis: Secondary | ICD-10-CM | POA: Diagnosis not present

## 2019-07-16 DIAGNOSIS — D692 Other nonthrombocytopenic purpura: Secondary | ICD-10-CM | POA: Diagnosis not present

## 2019-08-11 DIAGNOSIS — Z23 Encounter for immunization: Secondary | ICD-10-CM | POA: Diagnosis not present

## 2019-10-02 ENCOUNTER — Telehealth: Payer: Self-pay | Admitting: Certified Nurse Midwife

## 2019-10-02 ENCOUNTER — Other Ambulatory Visit: Payer: Self-pay

## 2019-10-07 ENCOUNTER — Encounter: Payer: Self-pay | Admitting: Certified Nurse Midwife

## 2019-10-07 ENCOUNTER — Other Ambulatory Visit (HOSPITAL_COMMUNITY)
Admission: RE | Admit: 2019-10-07 | Discharge: 2019-10-07 | Disposition: A | Discharge status: Home / Self Care | Payer: Medicare Other | Source: Ambulatory Visit | Attending: Certified Nurse Midwife | Admitting: Certified Nurse Midwife

## 2019-10-07 ENCOUNTER — Other Ambulatory Visit: Payer: Self-pay

## 2019-10-07 ENCOUNTER — Ambulatory Visit (INDEPENDENT_AMBULATORY_CARE_PROVIDER_SITE_OTHER): Payer: Medicare Other | Admitting: Certified Nurse Midwife

## 2019-10-07 VITALS — BP 124/80 | HR 70 | Temp 97.1°F | Resp 16 | Ht 62.25 in | Wt 101.0 lb

## 2019-10-07 DIAGNOSIS — R413 Other amnesia: Secondary | ICD-10-CM

## 2019-10-07 DIAGNOSIS — Z124 Encounter for screening for malignant neoplasm of cervix: Secondary | ICD-10-CM | POA: Diagnosis not present

## 2019-10-07 DIAGNOSIS — Z01419 Encounter for gynecological examination (general) (routine) without abnormal findings: Secondary | ICD-10-CM | POA: Diagnosis not present

## 2019-10-07 DIAGNOSIS — Z1151 Encounter for screening for human papillomavirus (HPV): Secondary | ICD-10-CM | POA: Insufficient documentation

## 2019-10-07 DIAGNOSIS — R634 Abnormal weight loss: Secondary | ICD-10-CM

## 2019-10-07 NOTE — Patient Instructions (Signed)

## 2019-10-07 NOTE — Progress Notes (Signed)
78 y.o. G20P2002 Married  Caucasian Fe here for annual exam. Post menopausal, no vaginal bleeding or vaginal dryness. Spouse doing well and helpful per patient. She is still driving. Aware she had lost 12 pounds last year. Has been "eating".  Denies incontinence. Sees Dr Joylene Draft for aex and medication management of hypertension, depression.Marland Kitchen Has not had mammogram that she is aware of. No other health issues today.  Patient's last menstrual period was 06/23/2010 (approximate).          Sexually active: Yes.    The current method of family planning is post menopausal status.    Exercising: No.  exercise Smoker:  no  Review of Systems  Constitutional: Negative.   HENT: Negative.   Eyes: Negative.   Respiratory: Negative.   Cardiovascular: Negative.   Gastrointestinal: Negative.   Genitourinary: Negative.   Musculoskeletal: Negative.   Skin: Negative.   Neurological: Negative.   Endo/Heme/Allergies: Negative.   Psychiatric/Behavioral: Negative.     Health Maintenance: Pap:  07-26-16 neg History of Abnormal Pap: no MMG:  10-01-18 category c density birads 1:neg, Self Breast exams: yes Colonoscopy:  2014 f/u 2019 (unsure if done) BMD:   2017 pcp manages TDaP:  With pcp Shingles: 2018 Pneumonia: had done Hep C and HIV: not done Labs: if needed   reports that she has never smoked. She has never used smokeless tobacco. She reports that she does not drink alcohol or use drugs.  Past Medical History:  Diagnosis Date  . Anxiety   . Aortic insufficiency   . Arthritis    "hands" (01/06/2013)  . Basal cell carcinoma of skin   . Bicuspid aortic valve   . Bicuspid aortic valve   . Cataract    right  . Colitis, collagenous summer of 2013  . Colon polyp   . DDD (degenerative disc disease) 12/2009  . GERD (gastroesophageal reflux disease)    "/endoscopy; doesn't bother me at all" (01/06/2013)  . Hard of hearing    bilat  . Heart murmur   . Hx of migraines   . Hyperlipidemia   .  Hypertension   . Left facial numbness    previous, not current  . Migraine    "used to have once/month; none in years" (01/06/2013)  . Palpitations   . Pneumonia ~ 1944; ~ 2009  . Tinnitus   . Vertigo     Past Surgical History:  Procedure Laterality Date  . CATARACT EXTRACTION EXTRACAPSULAR Bilateral 2015  . COLONOSCOPY N/A 01/07/2013   Procedure: COLONOSCOPY;  Surgeon: Cleotis Nipper, MD;  Location[facility] ENDOSCOPY;  Service: Endoscopy;  Laterality: N/A;  The colonoscopy will be unprepped  . DILATION AND CURETTAGE OF UTERUS  2002   w/hysteroscopy  . ESOPHAGOGASTRODUODENOSCOPY N/A 01/07/2013   Procedure: ESOPHAGOGASTRODUODENOSCOPY (EGD);  Surgeon: Cleotis Nipper, MD;  Location: [facility] ENDOSCOPY;  Service: Endoscopy;  Laterality: N/A;  . HYSTEROSCOPY  2002   D&C(sub fibroid)   . HYSTEROSCOPY  2006   resection polyps D&C  . THYROIDECTOMY, PARTIAL     "removed gland from left side of neck that was to go down and past thyroid in front" (01/06/2013)  . TONSILLECTOMY     "when I was little" (01/06/2013)  . TUBAL LIGATION Bilateral 1970's  . TUMOR REMOVAL  1990's & 06/23/2012, 02/2014   "benign; roof of mouth" (01/06/2013)    Current Outpatient Medications  Medication Sig Dispense Refill  . amoxicillin (AMOXIL) 500 MG capsule Take 1 capsule by mouth as needed (AS NEEDED FOR  DENTAL PROCEDURES).   0  . budesonide (ENTOCORT EC) 3 MG 24 hr capsule Take 9 mg by mouth daily.   0  . escitalopram (LEXAPRO) 10 MG tablet Take 10 mg by mouth daily.     Marland Kitchen losartan (COZAAR) 50 MG tablet Take 1 tablet by mouth daily.    . metoprolol succinate (TOPROL XL) 25 MG 24 hr tablet Take 1 tablet (25 mg total) by mouth daily. 90 tablet 3  . Multiple Vitamin (MULITIVITAMIN WITH MINERALS) TABS Take 1 tablet by mouth daily.    . potassium chloride SA (K-DUR,KLOR-CON) 20 MEQ tablet Take 20 mEq by mouth daily.    . rivastigmine (EXELON) 9.5 mg/24hr   0   No current facility-administered medications for this visit.     Family History  Problem Relation Age of Onset  . Bone cancer Father   . Hypertension Father   . Thyroid disease Father        hypo  . Brain cancer Brother   . Arrhythmia Mother   . Osteoporosis Mother   . Cancer Mother        colon  . Diabetes Mother   . Hypertension Mother     ROS:  Pertinent items are noted in HPI.  Otherwise, a comprehensive ROS was negative.  Exam:   LMP 06/23/2010 (Approximate)    Ht Readings from Last 3 Encounters:  07/08/19 5' 3.5" (1.613 m)  12/19/18 5' 3.5" (1.613 m)  10/02/18 5' 2.5" (1.588 m)    General appearance: alert, cooperative and appears stated age Head: Normocephalic, without obvious abnormality, atraumatic Neck: no adenopathy, supple, symmetrical, trachea midline and thyroid normal to inspection and palpation Lungs: clear to auscultation bilaterally Breasts: normal appearance, no masses or tenderness, No nipple retraction or dimpling, No nipple discharge or bleeding, No axillary or supraclavicular adenopathy Heart: regular rate and rhythm Abdomen: soft, non-tender; no masses,  no organomegaly Extremities: extremities normal, atraumatic, no cyanosis or edema Skin: Skin color, texture, turgor normal. No rashes or lesions Lymph nodes: Cervical, supraclavicular, and axillary nodes normal. No abnormal inguinal nodes palpated Neurologic: Grossly normal   Pelvic: External genitalia:  no lesions              Urethra:  normal appearing urethra with no masses, tenderness or lesions              Bartholin's and Skene's: normal                 Vagina: normal appearing vagina with normal color and discharge, no lesions              Cervix: no cervical motion tenderness, no lesions and normal appearance              Pap taken: Yes.   Bimanual Exam:  Uterus:  normal size, contour, position, consistency, mobility, non-tender and anteverted              Adnexa: normal adnexa and no mass, fullness, tenderness               Rectovaginal:  Confirms               Anus:  normal sphincter tone, no lesions  Chaperone present: yes  A:  Well Woman with normal exam  Post menopausal no HRT  Memory changes  Mammogram due  Weight loss of 12 pounds last year with one pound gain, eating less  P:   Reviewed health and wellness pertinent to exam  Discussed  if vaginal bleeding she needs to call.  Discussed having spouse help with scheduling appointments. Will give her written information.  Discussed eating small frequent meals to help with fatigue and weight maintenance. Questions answered.  Pap smear: yes  counseled on breast self exam, mammography screening, feminine hygiene, adequate intake of calcium and vitamin D, diet and exercise return annually or prn  An After Visit Summary was printed and given to the patient.

## 2019-10-09 LAB — CYTOLOGY - PAP
Comment: NEGATIVE
Diagnosis: NEGATIVE
High risk HPV: NEGATIVE

## 2019-10-28 DIAGNOSIS — R7301 Impaired fasting glucose: Secondary | ICD-10-CM | POA: Diagnosis not present

## 2019-10-28 DIAGNOSIS — E7849 Other hyperlipidemia: Secondary | ICD-10-CM | POA: Diagnosis not present

## 2019-10-28 DIAGNOSIS — M859 Disorder of bone density and structure, unspecified: Secondary | ICD-10-CM | POA: Diagnosis not present

## 2019-11-04 DIAGNOSIS — Z0000 Encounter for general adult medical examination without abnormal findings: Secondary | ICD-10-CM | POA: Diagnosis not present

## 2019-11-04 DIAGNOSIS — R634 Abnormal weight loss: Secondary | ICD-10-CM | POA: Diagnosis not present

## 2019-11-04 DIAGNOSIS — R82998 Other abnormal findings in urine: Secondary | ICD-10-CM | POA: Diagnosis not present

## 2019-11-04 DIAGNOSIS — Z5181 Encounter for therapeutic drug level monitoring: Secondary | ICD-10-CM | POA: Diagnosis not present

## 2019-11-04 DIAGNOSIS — Z7189 Other specified counseling: Secondary | ICD-10-CM | POA: Diagnosis not present

## 2019-11-12 ENCOUNTER — Other Ambulatory Visit: Payer: Self-pay | Admitting: Internal Medicine

## 2019-11-12 DIAGNOSIS — Z1231 Encounter for screening mammogram for malignant neoplasm of breast: Secondary | ICD-10-CM

## 2019-12-19 ENCOUNTER — Ambulatory Visit: Payer: Medicare Other

## 2020-01-01 ENCOUNTER — Ambulatory Visit: Payer: Medicare Other

## 2020-01-14 ENCOUNTER — Encounter: Payer: Self-pay | Admitting: Certified Nurse Midwife

## 2020-01-16 ENCOUNTER — Ambulatory Visit: Payer: Medicare Other

## 2020-01-27 ENCOUNTER — Ambulatory Visit: Payer: Medicare Other

## 2020-02-17 ENCOUNTER — Ambulatory Visit
Admission: RE | Admit: 2020-02-17 | Discharge: 2020-02-17 | Disposition: A | Discharge status: Home / Self Care | Payer: Medicare Other | Source: Ambulatory Visit | Attending: Internal Medicine | Admitting: Internal Medicine

## 2020-02-17 ENCOUNTER — Other Ambulatory Visit: Payer: Self-pay

## 2020-02-17 DIAGNOSIS — Z1231 Encounter for screening mammogram for malignant neoplasm of breast: Secondary | ICD-10-CM | POA: Diagnosis not present

## 2020-03-04 DIAGNOSIS — F0390 Unspecified dementia without behavioral disturbance: Secondary | ICD-10-CM | POA: Diagnosis not present

## 2020-03-04 DIAGNOSIS — I10 Essential (primary) hypertension: Secondary | ICD-10-CM | POA: Diagnosis not present

## 2020-03-04 DIAGNOSIS — R42 Dizziness and giddiness: Secondary | ICD-10-CM | POA: Diagnosis not present

## 2020-03-18 DIAGNOSIS — R634 Abnormal weight loss: Secondary | ICD-10-CM | POA: Diagnosis not present

## 2020-03-18 DIAGNOSIS — F0390 Unspecified dementia without behavioral disturbance: Secondary | ICD-10-CM | POA: Diagnosis not present

## 2020-03-18 DIAGNOSIS — K2970 Gastritis, unspecified, without bleeding: Secondary | ICD-10-CM | POA: Diagnosis not present

## 2020-03-18 DIAGNOSIS — E876 Hypokalemia: Secondary | ICD-10-CM | POA: Diagnosis not present

## 2020-05-11 DIAGNOSIS — K9089 Other intestinal malabsorption: Secondary | ICD-10-CM | POA: Diagnosis not present

## 2020-06-03 DIAGNOSIS — E785 Hyperlipidemia, unspecified: Secondary | ICD-10-CM | POA: Diagnosis not present

## 2020-06-15 DIAGNOSIS — Z961 Presence of intraocular lens: Secondary | ICD-10-CM | POA: Diagnosis not present

## 2020-06-15 DIAGNOSIS — H02055 Trichiasis without entropian left lower eyelid: Secondary | ICD-10-CM | POA: Diagnosis not present

## 2020-06-23 DIAGNOSIS — E785 Hyperlipidemia, unspecified: Secondary | ICD-10-CM | POA: Diagnosis not present

## 2020-07-28 DIAGNOSIS — I10 Essential (primary) hypertension: Secondary | ICD-10-CM | POA: Diagnosis not present

## 2020-07-28 DIAGNOSIS — F0390 Unspecified dementia without behavioral disturbance: Secondary | ICD-10-CM | POA: Diagnosis not present

## 2020-07-28 DIAGNOSIS — M4302 Spondylolysis, cervical region: Secondary | ICD-10-CM | POA: Diagnosis not present

## 2020-07-28 DIAGNOSIS — R7301 Impaired fasting glucose: Secondary | ICD-10-CM | POA: Diagnosis not present

## 2020-09-20 DIAGNOSIS — F0390 Unspecified dementia without behavioral disturbance: Secondary | ICD-10-CM | POA: Diagnosis not present

## 2020-09-20 DIAGNOSIS — K9089 Other intestinal malabsorption: Secondary | ICD-10-CM | POA: Diagnosis not present

## 2020-10-08 ENCOUNTER — Ambulatory Visit: Payer: Medicare Other | Admitting: Certified Nurse Midwife

## 2020-10-20 DIAGNOSIS — D225 Melanocytic nevi of trunk: Secondary | ICD-10-CM | POA: Diagnosis not present

## 2020-10-20 DIAGNOSIS — Z85828 Personal history of other malignant neoplasm of skin: Secondary | ICD-10-CM | POA: Diagnosis not present

## 2020-10-20 DIAGNOSIS — D692 Other nonthrombocytopenic purpura: Secondary | ICD-10-CM | POA: Diagnosis not present

## 2020-10-20 DIAGNOSIS — D2262 Melanocytic nevi of left upper limb, including shoulder: Secondary | ICD-10-CM | POA: Diagnosis not present

## 2020-11-12 DIAGNOSIS — R7301 Impaired fasting glucose: Secondary | ICD-10-CM | POA: Diagnosis not present

## 2020-11-12 DIAGNOSIS — E785 Hyperlipidemia, unspecified: Secondary | ICD-10-CM | POA: Diagnosis not present

## 2020-11-15 DIAGNOSIS — K9089 Other intestinal malabsorption: Secondary | ICD-10-CM | POA: Diagnosis not present

## 2020-11-15 DIAGNOSIS — F0390 Unspecified dementia without behavioral disturbance: Secondary | ICD-10-CM | POA: Diagnosis not present

## 2020-11-15 DIAGNOSIS — K52831 Collagenous colitis: Secondary | ICD-10-CM | POA: Diagnosis not present

## 2020-11-19 DIAGNOSIS — Z0000 Encounter for general adult medical examination without abnormal findings: Secondary | ICD-10-CM | POA: Diagnosis not present

## 2020-11-19 DIAGNOSIS — D649 Anemia, unspecified: Secondary | ICD-10-CM | POA: Diagnosis not present

## 2020-11-19 DIAGNOSIS — E785 Hyperlipidemia, unspecified: Secondary | ICD-10-CM | POA: Diagnosis not present

## 2020-11-19 DIAGNOSIS — F0390 Unspecified dementia without behavioral disturbance: Secondary | ICD-10-CM | POA: Diagnosis not present

## 2020-11-25 ENCOUNTER — Ambulatory Visit: Payer: Medicare Other | Attending: Internal Medicine

## 2020-11-25 DIAGNOSIS — Z23 Encounter for immunization: Secondary | ICD-10-CM

## 2020-11-25 NOTE — Progress Notes (Signed)
   Covid-19 Vaccination Clinic  Name:  Bailey [name]    MRN: [id number] DOB: 1941-04-04  11/25/2020  Bailey [name] was observed post Covid-19 immunization for 30 minutes based on pre-vaccination screening without incident. She was provided with Vaccine Information Sheet and instruction to access the V-Safe system.   Bailey [name] was instructed to call 911 with any severe reactions post vaccine: Marland Kitchen Difficulty breathing  . Swelling of face and throat  . A fast heartbeat  . A bad rash all over body  . Dizziness and weakness   Immunizations Administered    Name Date Dose VIS Date Route   Moderna Covid-19 Booster Vaccine 11/25/2020  3:08 PM 0.25 mL 08/11/2020 Intramuscular   Manufacturer: Moderna   Lot: [id number]   [place]: [id number]

## 2021-02-28 DIAGNOSIS — R82998 Other abnormal findings in urine: Secondary | ICD-10-CM | POA: Diagnosis not present

## 2021-05-03 ENCOUNTER — Other Ambulatory Visit (HOSPITAL_BASED_OUTPATIENT_CLINIC_OR_DEPARTMENT_OTHER): Payer: Self-pay

## 2021-05-03 ENCOUNTER — Other Ambulatory Visit: Payer: Self-pay

## 2021-05-03 ENCOUNTER — Ambulatory Visit: Payer: Medicare Other | Attending: Internal Medicine

## 2021-05-03 DIAGNOSIS — Z23 Encounter for immunization: Secondary | ICD-10-CM

## 2021-05-03 MED ORDER — COVID-19 MRNA VACC (MODERNA) 100 MCG/0.5ML IM SUSP
INTRAMUSCULAR | 0 refills | Status: DC
  Filled 2021-05-03: qty 0.25, 1d supply, fill #0

## 2021-05-03 NOTE — Progress Notes (Signed)
   Covid-19 Vaccination Clinic  Name:  Bailey [name]    MRN: [id number] DOB: 1941-08-23  05/03/2021  [name] was observed post Covid-19 immunization for 15 minutes without incident. She was provided with Vaccine Information Sheet and instruction to access the V-Safe system.   [name] was instructed to call 911 with any severe reactions post vaccine: Difficulty breathing  Swelling of face and throat  A fast heartbeat  A bad rash all over body  Dizziness and weakness   Immunizations Administered     Name Date Dose VIS Date Route   Moderna Covid-19 Booster Vaccine 05/03/2021 10:49 AM 0.25 mL 08/11/2020 Intramuscular   Manufacturer: Moderna   Lot: [id number]   [place]: [id number]

## 2021-06-13 ENCOUNTER — Emergency Department (HOSPITAL_COMMUNITY): Payer: Medicare Other

## 2021-06-13 ENCOUNTER — Emergency Department (HOSPITAL_COMMUNITY)
Admission: EM | Admit: 2021-06-13 | Discharge: 2021-06-13 | Disposition: A | Discharge status: Home / Self Care | Payer: Medicare Other | Attending: Emergency Medicine | Admitting: Emergency Medicine

## 2021-06-13 ENCOUNTER — Encounter (HOSPITAL_COMMUNITY): Payer: Self-pay

## 2021-06-13 ENCOUNTER — Other Ambulatory Visit: Payer: Self-pay

## 2021-06-13 ENCOUNTER — Ambulatory Visit
Admission: EM | Admit: 2021-06-13 | Discharge: 2021-06-13 | Disposition: A | Discharge status: Other Acute Inpatient Hospital | Payer: Medicare Other | Attending: Internal Medicine | Admitting: Internal Medicine

## 2021-06-13 ENCOUNTER — Encounter: Payer: Self-pay | Admitting: Emergency Medicine

## 2021-06-13 DIAGNOSIS — Z79899 Other long term (current) drug therapy: Secondary | ICD-10-CM | POA: Insufficient documentation

## 2021-06-13 DIAGNOSIS — R519 Headache, unspecified: Secondary | ICD-10-CM | POA: Diagnosis present

## 2021-06-13 DIAGNOSIS — R42 Dizziness and giddiness: Secondary | ICD-10-CM

## 2021-06-13 DIAGNOSIS — F0390 Unspecified dementia without behavioral disturbance: Secondary | ICD-10-CM | POA: Insufficient documentation

## 2021-06-13 DIAGNOSIS — I10 Essential (primary) hypertension: Secondary | ICD-10-CM | POA: Insufficient documentation

## 2021-06-13 DIAGNOSIS — Z85828 Personal history of other malignant neoplasm of skin: Secondary | ICD-10-CM | POA: Diagnosis not present

## 2021-06-13 DIAGNOSIS — I160 Hypertensive urgency: Secondary | ICD-10-CM | POA: Diagnosis not present

## 2021-06-13 DIAGNOSIS — R001 Bradycardia, unspecified: Secondary | ICD-10-CM | POA: Diagnosis not present

## 2021-06-13 LAB — CBC
HCT: 38.6 % (ref 36.0–46.0)
Hemoglobin: 12.5 g/dL (ref 12.0–15.0)
MCH: 30.6 pg (ref 26.0–34.0)
MCHC: 32.4 g/dL (ref 30.0–36.0)
MCV: 94.4 fL (ref 80.0–100.0)
Platelets: 188 10*3/uL (ref 150–400)
RBC: 4.09 MIL/uL (ref 3.87–5.11)
RDW: 13.2 % (ref 11.5–15.5)
WBC: 6.5 10*3/uL (ref 4.0–10.5)
nRBC: 0 % (ref 0.0–0.2)

## 2021-06-13 LAB — I-STAT CHEM 8, ED
BUN: 26 mg/dL — ABNORMAL HIGH (ref 8–23)
Calcium, Ion: 1.24 mmol/L (ref 1.15–1.40)
Chloride: 100 mmol/L (ref 98–111)
Creatinine, Ser: 1.1 mg/dL — ABNORMAL HIGH (ref 0.44–1.00)
Glucose, Bld: 104 mg/dL — ABNORMAL HIGH (ref 70–99)
HCT: 35 % — ABNORMAL LOW (ref 36.0–46.0)
Hemoglobin: 11.9 g/dL — ABNORMAL LOW (ref 12.0–15.0)
Potassium: 3.7 mmol/L (ref 3.5–5.1)
Sodium: 140 mmol/L (ref 135–145)
TCO2: 33 mmol/L — ABNORMAL HIGH (ref 22–32)

## 2021-06-13 LAB — COMPREHENSIVE METABOLIC PANEL
ALT: 6 U/L (ref 0–44)
AST: 20 U/L (ref 15–41)
Albumin: 3.8 g/dL (ref 3.5–5.0)
Alkaline Phosphatase: 46 U/L (ref 38–126)
Anion gap: 7 (ref 5–15)
BUN: 24 mg/dL — ABNORMAL HIGH (ref 8–23)
CO2: 32 mmol/L (ref 22–32)
Calcium: 9.2 mg/dL (ref 8.9–10.3)
Chloride: 100 mmol/L (ref 98–111)
Creatinine, Ser: 1.13 mg/dL — ABNORMAL HIGH (ref 0.44–1.00)
GFR, Estimated: 49 mL/min — ABNORMAL LOW (ref >60)
Glucose, Bld: 105 mg/dL — ABNORMAL HIGH (ref 70–99)
Potassium: 3.7 mmol/L (ref 3.5–5.1)
Sodium: 139 mmol/L (ref 135–145)
Total Bilirubin: 0.8 mg/dL (ref 0.3–1.2)
Total Protein: 6.1 g/dL — ABNORMAL LOW (ref 6.5–8.1)

## 2021-06-13 LAB — DIFFERENTIAL
Abs Immature Granulocytes: 0.02 10*3/uL (ref 0.00–0.07)
Basophils Absolute: 0 10*3/uL (ref 0.0–0.1)
Basophils Relative: 1 %
Eosinophils Absolute: 0.1 10*3/uL (ref 0.0–0.5)
Eosinophils Relative: 2 %
Immature Granulocytes: 0 %
Lymphocytes Relative: 26 %
Lymphs Abs: 1.7 10*3/uL (ref 0.7–4.0)
Monocytes Absolute: 0.6 10*3/uL (ref 0.1–1.0)
Monocytes Relative: 10 %
Neutro Abs: 4 10*3/uL (ref 1.7–7.7)
Neutrophils Relative %: 61 %

## 2021-06-13 LAB — PROTIME-INR
INR: 1 (ref 0.8–1.2)
Prothrombin Time: 13.5 seconds (ref 11.4–15.2)

## 2021-06-13 LAB — POCT FASTING CBG KUC MANUAL ENTRY: POCT Glucose (KUC): 160 mg/dL — AB (ref 70–99)

## 2021-06-13 LAB — APTT: aPTT: 27 seconds (ref 24–36)

## 2021-06-13 LAB — ETHANOL: Alcohol, Ethyl (B): <10 mg/dL (ref <10)

## 2021-06-13 LAB — TROPONIN I (HIGH SENSITIVITY): Troponin I (High Sensitivity): 8 ng/L (ref <18)

## 2021-06-13 MED ORDER — LABETALOL HCL 5 MG/ML IV SOLN
20.0000 mg | Freq: Once | INTRAVENOUS | Status: DC
  Filled 2021-06-13: qty 4

## 2021-06-13 MED ORDER — CHLORTHALIDONE 50 MG PO TABS
50.0000 mg | ORAL_TABLET | Freq: Once | ORAL | Status: AC
  Administered 2021-06-13: 50 mg via ORAL
  Filled 2021-06-13: qty 1

## 2021-06-13 MED ORDER — LABETALOL HCL 5 MG/ML IV SOLN
20.0000 mg | Freq: Once | INTRAVENOUS | Status: AC
  Administered 2021-06-13: 20 mg via INTRAVENOUS
  Filled 2021-06-13: qty 4

## 2021-06-13 MED ORDER — CHLORTHALIDONE 25 MG PO TABS: 25.0000 mg | ORAL_TABLET | Freq: Every day | ORAL | 0 refills | Status: DC

## 2021-06-13 MED ORDER — SODIUM CHLORIDE 0.9 % IV BOLUS
500.0000 mL | Freq: Once | INTRAVENOUS | Status: AC
  Administered 2021-06-13: 500 mL via INTRAVENOUS

## 2021-06-13 NOTE — ED Triage Notes (Signed)
Patient arrived with complaint of dizziness while eating lunch today. On arrival reports that symptoms resolved and feels well. Patient cannot remember what she ate or what restaurant she was eating at.

## 2021-06-13 NOTE — ED Provider Notes (Addendum)
[facility]    CSN: [id number] Arrival date & time: 06/13/21  1329      History   Chief Complaint Chief Complaint  Patient presents with   Dizziness    HPI Bailey [name] is a 80 y.o. female.   Patient presents for dizziness and "head tightness" that started approximately 1 hour prior to arrival when patient was eating at a restaurant. Denies headache but endorses "head tightness and pressure." Patient denies that position change makes dizziness worse. States that dizziness is constant. Patient is a poor historian due to baseline memory problems. Husband is at bedside. Patient denies any chest pain, shortness of breath, blurred vision, nausea, vomiting, fever, upper respiratory symptoms, abdominal pain, recent diarrhea. Nursing staff noticed abnormal gait when ambulating into physical exam room. Patient does have history of bicuspid aortic valve dysfunction as well as hypertension. Husband denies any recent medication changes.    Dizziness  Past Medical History:  Diagnosis Date   Anxiety    Aortic insufficiency    Arthritis    "hands" (01/06/2013)   Basal cell carcinoma of skin    Bicuspid aortic valve    Bicuspid aortic valve    Cataract    right   Colitis, collagenous summer of 2013   Colon polyp    DDD (degenerative disc disease) 12/2009   GERD (gastroesophageal reflux disease)    "/endoscopy; doesn't bother me at all" (01/06/2013)   Hard of hearing    bilat   Heart murmur    Hx of migraines    Hyperlipidemia    Hypertension    Left facial numbness    previous, not current   Migraine    "used to have once/month; none in years" (01/06/2013)   Palpitations    Pneumonia ~ 1944; ~ 2009   Tinnitus    Vertigo     Patient Active Problem List   Diagnosis Date Noted   Aortic insufficiency 09/06/2017   Peripheral neuropathy 08/15/2017   Tinnitus 05/18/2014   Pleomorphic adenoma of minor salivary gland 01/13/2014   Diarrhea 01/06/2013   Nausea with  vomiting 01/06/2013   Abdominal pain, right upper quadrant 01/06/2013   Palpitations 01/06/2013   Bright red blood per rectum 01/06/2013   Dehydration 01/06/2013   Hypokalemia 01/06/2013   Hyponatremia 01/06/2013   Acute renal failure ([place]) 01/06/2013   Migraine 03/14/2012   Dizziness 03/14/2012   Bicuspid aortic valve 02/27/2012   HTN (hypertension) 02/27/2012   Hyperlipidemia 02/27/2012    Past Surgical History:  Procedure Laterality Date   CATARACT EXTRACTION EXTRACAPSULAR Bilateral 2015   COLONOSCOPY N/A 01/07/2013   Procedure: COLONOSCOPY;  Surgeon: Cleotis Nipper, MD;  Location: [facility] ENDOSCOPY;  Service: Endoscopy;  Laterality: N/A;  The colonoscopy will be unprepped   [place] OF UTERUS  2002   w/hysteroscopy   ESOPHAGOGASTRODUODENOSCOPY N/A 01/07/2013   Procedure: ESOPHAGOGASTRODUODENOSCOPY (EGD);  Surgeon: Cleotis Nipper, MD;  Location: [facility] ENDOSCOPY;  Service: Endoscopy;  Laterality: N/A;   HYSTEROSCOPY  2002   D&C(sub fibroid)    HYSTEROSCOPY  2006   resection polyps D&C   THYROIDECTOMY, PARTIAL     "removed gland from left side of neck that was to go down and past thyroid in front" (01/06/2013)   TONSILLECTOMY     "when I was little" (01/06/2013)   TUBAL LIGATION Bilateral 1970's   TUMOR REMOVAL  1990's & 06/23/2012, 02/2014   "benign; roof of mouth" (01/06/2013)    OB History  Gravida  2   Para  2   Term  2   Preterm      AB      Living  2      SAB      IAB      Ectopic      Multiple      Live Births  2            Home Medications    Prior to Admission medications   Medication Sig Start Date End Date Taking? Authorizing Provider  amoxicillin (AMOXIL) 500 MG capsule Take 1 capsule by mouth as needed (AS NEEDED FOR DENTAL PROCEDURES).  Patient not taking: Reported on 06/13/2021 03/16/15   [provider]  budesonide (ENTOCORT EC) 3 MG 24 hr capsule Take 9 mg by mouth daily.  09/24/18   [provider]   COVID-19 mRNA vaccine, Moderna, 100 MCG/0.5ML injection Inject into the muscle. 05/03/21   Carlyle Basques, MD  escitalopram (LEXAPRO) 10 MG tablet Take 10 mg by mouth daily.  11/25/18   [provider]  losartan (COZAAR) 50 MG tablet Take 1 tablet by mouth daily. 06/25/19   [provider]  metoprolol succinate (TOPROL XL) 25 MG 24 hr tablet Take 1 tablet (25 mg total) by mouth daily. 07/08/19   Nahser, Wonda Cheng, MD  Multiple Vitamin (MULITIVITAMIN WITH MINERALS) TABS Take 1 tablet by mouth daily.    [provider]  potassium chloride (KLOR-CON) 10 MEQ tablet Take 10 mEq by mouth daily. 10/01/19   [provider]  rivastigmine (EXELON) 9.5 mg/24hr  09/23/18   [provider]    Family History Family History  Problem Relation Age of Onset   Bone cancer Father    Hypertension Father    Thyroid disease Father        hypo   Brain cancer Brother    Arrhythmia Mother    Osteoporosis Mother    Cancer Mother        colon   Diabetes Mother    Hypertension Mother     Social History Social History   Tobacco Use   Smoking status: Never   Smokeless tobacco: Never  Vaping Use   Vaping Use: Never used  Substance Use Topics   Alcohol use: No    Alcohol/week: 0.0 standard drinks   Drug use: No     Allergies   Advil [ibuprofen], Ibuprofen, Metronidazole, and Ondansetron   Review of Systems Review of Systems  Neurological:  Positive for dizziness.  Per HPI  Physical Exam Triage Vital Signs ED Triage Vitals [06/13/21 1341]  Enc Vitals Group     BP (!) 159/77     Pulse Rate (!) 56     Resp 18     Temp 98.3 F (36.8 C)     Temp Source Oral     SpO2 96 %     Weight      Height      Head Circumference      Peak Flow      Pain Score 0     Pain Loc      Pain Edu?      Excl. in [place]?    No data found.  Updated Vital Signs BP (!) 159/77 (BP Location: Left Arm)   Pulse (!) 56   Temp 98.3 F (36.8 C) (Oral)   Resp 18   LMP  06/23/2010 (Approximate)   SpO2 96%   Visual Acuity Right Eye Distance:  Left Eye Distance:   Bilateral Distance:    Right Eye Near:   Left Eye Near:    Bilateral Near:     Physical Exam Constitutional:      Appearance: Normal appearance.  HENT:     Head: Normocephalic and atraumatic.  Eyes:     Extraocular Movements: Extraocular movements intact.     Conjunctiva/sclera: Conjunctivae normal.  Cardiovascular:     Rate and Rhythm: Regular rhythm. Bradycardia present.     Pulses: Normal pulses.     Heart sounds: Normal heart sounds.     Comments: Bradycardia at baseline per previous vital signs and EKG's in chart system Pulmonary:     Effort: Pulmonary effort is normal. No respiratory distress.     Breath sounds: Normal breath sounds.  Abdominal:     General: Abdomen is flat. Bowel sounds are normal. There is no distension.     Palpations: Abdomen is soft.     Tenderness: There is no abdominal tenderness.  Skin:    General: Skin is warm and dry.  Neurological:     General: No focal deficit present.     Mental Status: She is alert. Mental status is at baseline. She is disoriented.     Cranial Nerves: Cranial nerves are intact.     Sensory: Sensation is intact.     Motor: Motor function is intact.     Coordination: Coordination is intact.     Gait: Gait abnormal.     Comments: Stumbling present while walking  Psychiatric:        Mood and Affect: Mood normal.        Behavior: Behavior normal.        Thought Content: Thought content normal.        Judgment: Judgment normal.     UC Treatments / Results  Labs (all labs ordered are listed, but only abnormal results are displayed) Labs Reviewed  POCT FASTING CBG [facility] MANUAL ENTRY - Abnormal; Notable for the following components:      Result Value   POCT Glucose ([facility]) 160 (*)    All other components within normal limits    EKG   Radiology No results found.  Procedures Procedures (including critical care  time)  Medications Ordered in UC Medications - No data to display  Initial Impression / Assessment and Plan / UC Course  I have reviewed the triage vital signs and the nursing notes.  Pertinent labs & imaging results that were available during my care of the patient were reviewed by me and considered in my medical decision making (see chart for details).     Suspicious for more worrisome clinical diagnoses including CVA or cardiac related issues. Patient has associated risk factors including bicuspid valve insufficiency and hypertension. EMS was called to transport patient to the hospital for further evaluation and management. Blood glucose was unremarkable in urgent care today. Heart rate consistent with baseline. Patient and husband were agreeable with plan.  Final Clinical Impressions(s) / UC Diagnoses   Final diagnoses:  Dizziness and giddiness     Discharge Instructions      Patient discharged to the hospital via EMS for further evaluation and management.      ED Prescriptions   None    PDMP not reviewed this encounter.   Odis Luster, FNP 06/13/21 1405    Odis Luster, FNP 06/13/21 1405    Odis Luster, FNP 06/13/21 1416

## 2021-06-13 NOTE — ED Provider Notes (Signed)
Bailey [name]   CSN: [id number] Arrival date & time: 06/13/21  1453     History No chief complaint on file.   Bailey [name] is a 80 y.o. female.  The history is provided by the patient and the spouse. The history is limited by the condition of the patient (Has mild dementia and cannot remember things that just happened.).  Dizziness Quality:  Lightheadedness Severity:  Mild Onset quality:  Gradual Duration:  1 hour Timing:  Constant Progression:  Resolved Chronicity:  New Context comment:  After eating lunch, she told her husband she had been dizzy the whole time they were eating. Relieved by:  Nothing Worsened by:  Nothing Ineffective treatments:  None tried Associated symptoms: no chest pain, no headaches, no palpitations, no shortness of breath and no vomiting   Associated symptoms comment:  At one point, the patient said she had some pressure around her head. She does not remember this statement and has no pain now.  Risk factors comment:  States no new medication changes. Has been taking BP meds.     Past Medical History:  Diagnosis Date   Anxiety    Aortic insufficiency    Arthritis    "hands" (01/06/2013)   Basal cell carcinoma of skin    Bicuspid aortic valve    Bicuspid aortic valve    Cataract    right   Colitis, collagenous summer of 2013   Colon polyp    DDD (degenerative disc disease) 12/2009   GERD (gastroesophageal reflux disease)    "/endoscopy; doesn't bother me at all" (01/06/2013)   Hard of hearing    bilat   Heart murmur    Hx of migraines    Hyperlipidemia    Hypertension    Left facial numbness    previous, not current   Migraine    "used to have once/month; none in years" (01/06/2013)   Palpitations    Pneumonia ~ 1944; ~ 2009   Tinnitus    Vertigo     Patient Active Problem List   Diagnosis Date Noted   Aortic insufficiency 09/06/2017   Peripheral neuropathy 08/15/2017    Tinnitus 05/18/2014   Pleomorphic adenoma of minor salivary gland 01/13/2014   Diarrhea 01/06/2013   Nausea with vomiting 01/06/2013   Abdominal pain, right upper quadrant 01/06/2013   Palpitations 01/06/2013   Bright red blood per rectum 01/06/2013   Dehydration 01/06/2013   Hypokalemia 01/06/2013   Hyponatremia 01/06/2013   Acute renal failure ([place]) 01/06/2013   Migraine 03/14/2012   Dizziness 03/14/2012   Bicuspid aortic valve 02/27/2012   HTN (hypertension) 02/27/2012   Hyperlipidemia 02/27/2012    Past Surgical History:  Procedure Laterality Date   CATARACT EXTRACTION EXTRACAPSULAR Bilateral 2015   COLONOSCOPY N/A 01/07/2013   Procedure: COLONOSCOPY;  Surgeon: Cleotis Nipper, MD;  Location[facility] ENDOSCOPY;  Service: Endoscopy;  Laterality: N/A;  The colonoscopy will be unprepped   [place] OF UTERUS  2002   w/hysteroscopy   ESOPHAGOGASTRODUODENOSCOPY N/A 01/07/2013   Procedure: ESOPHAGOGASTRODUODENOSCOPY (EGD);  Surgeon: Cleotis Nipper, MD;  Location: [facility] ENDOSCOPY;  Service: Endoscopy;  Laterality: N/A;   HYSTEROSCOPY  2002   D&C(sub fibroid)    HYSTEROSCOPY  2006   resection polyps D&C   THYROIDECTOMY, PARTIAL     "removed gland from left side of neck that was to go down and past thyroid in front" (01/06/2013)   TONSILLECTOMY     "when I was  little" (01/06/2013)   TUBAL LIGATION Bilateral 1970's   TUMOR REMOVAL  1990's & 06/23/2012, 02/2014   "benign; roof of mouth" (01/06/2013)     OB History     Gravida  2   Para  2   Term  2   Preterm      AB      Living  2      SAB      IAB      Ectopic      Multiple      Live Births  2           Family History  Problem Relation Age of Onset   Bone cancer Father    Hypertension Father    Thyroid disease Father        hypo   Brain cancer Brother    Arrhythmia Mother    Osteoporosis Mother    Cancer Mother        colon   Diabetes Mother    Hypertension Mother     Social History    Tobacco Use   Smoking status: Never   Smokeless tobacco: Never  Vaping Use   Vaping Use: Never used  Substance Use Topics   Alcohol use: No    Alcohol/week: 0.0 standard drinks   Drug use: No    Home Medications Prior to Admission medications   Medication Sig Start Date End Date Taking? Authorizing Provider  amoxicillin (AMOXIL) 500 MG capsule Take 1 capsule by mouth as needed (AS NEEDED FOR DENTAL PROCEDURES).  Patient not taking: Reported on 06/13/2021 03/16/15   [provider]  budesonide (ENTOCORT EC) 3 MG 24 hr capsule Take 9 mg by mouth daily.  09/24/18   [provider]  COVID-19 mRNA vaccine, Moderna, 100 MCG/0.5ML injection Inject into the muscle. 05/03/21   Carlyle Basques, MD  escitalopram (LEXAPRO) 10 MG tablet Take 10 mg by mouth daily.  11/25/18   [provider]  losartan (COZAAR) 50 MG tablet Take 1 tablet by mouth daily. 06/25/19   [provider]  metoprolol succinate (TOPROL XL) 25 MG 24 hr tablet Take 1 tablet (25 mg total) by mouth daily. 07/08/19   Nahser, Wonda Cheng, MD  Multiple Vitamin (MULITIVITAMIN WITH MINERALS) TABS Take 1 tablet by mouth daily.    [provider]  potassium chloride (KLOR-CON) 10 MEQ tablet Take 10 mEq by mouth daily. 10/01/19   [provider]  rivastigmine (EXELON) 9.5 mg/24hr  09/23/18   [provider]    Allergies    Advil [ibuprofen], Ibuprofen, Metronidazole, and Ondansetron  Review of Systems   Review of Systems  Constitutional:  Negative for chills and fever.  HENT:  Negative for ear pain and sore throat.   Eyes:  Negative for pain and visual disturbance.  Respiratory:  Negative for cough and shortness of breath.   Cardiovascular:  Negative for chest pain and palpitations.  Gastrointestinal:  Negative for abdominal pain and vomiting.  Genitourinary:  Negative for dysuria and hematuria.  Musculoskeletal:  Negative for arthralgias and back pain.  Skin:  Negative for  color change and rash.  Neurological:  Positive for dizziness. Negative for seizures, syncope and headaches.  All other systems reviewed and are negative.  Physical Exam Updated Vital Signs BP (!) 232/100   Pulse (!) 51   Temp 98.7 F (37.1 C) (Oral)   Resp 19   LMP 06/23/2010 (Approximate)   SpO2 100%   Physical Exam Vitals and nursing [name]  reviewed.  Constitutional:      Appearance: She is well-developed.  HENT:     Head: Normocephalic and atraumatic.  Cardiovascular:     Rate and Rhythm: Normal rate and regular rhythm.     Heart sounds: Murmur heard.     Comments: 2/6 systolic murmur  Pulmonary:     Effort: Pulmonary effort is normal. No tachypnea.     Breath sounds: Normal breath sounds.  Abdominal:     Palpations: Abdomen is soft.     Tenderness: There is no abdominal tenderness.  Musculoskeletal:     Right lower leg: No edema.     Left lower leg: No edema.  Skin:    General: Skin is warm and dry.  Neurological:     General: No focal deficit present.     Mental Status: She is alert and oriented to person, place, and time.     Cranial Nerves: No cranial nerve deficit.     Sensory: No sensory deficit.     Motor: No weakness.     Coordination: Coordination normal.  Psychiatric:        Mood and Affect: Mood normal.        Behavior: Behavior normal.    ED Results / Procedures / Treatments   Labs (all labs ordered are listed, but only abnormal results are displayed) Labs Reviewed  COMPREHENSIVE METABOLIC PANEL - Abnormal; Notable for the following components:      Result Value   Glucose, Bld 105 (*)    BUN 24 (*)    Creatinine, Ser 1.13 (*)    Total Protein 6.1 (*)    GFR, Estimated 49 (*)    All other components within normal limits  I-STAT CHEM 8, ED - Abnormal; Notable for the following components:   BUN 26 (*)    Creatinine, Ser 1.10 (*)    Glucose, Bld 104 (*)    TCO2 33 (*)    Hemoglobin 11.9 (*)    HCT 35.0 (*)    All other components within  normal limits  ETHANOL  PROTIME-INR  APTT  CBC  DIFFERENTIAL  RAPID URINE DRUG SCREEN, HOSP PERFORMED  URINALYSIS, ROUTINE W REFLEX MICROSCOPIC  TROPONIN I (HIGH SENSITIVITY)    EKG EKG Interpretation  Date/Time:  Monday June 13 2021 19:03:46 EDT Ventricular Rate:  53 PR Interval:  161 QRS Duration: 99 QT Interval:  474 QTC Calculation: 445 R Axis:   -55 Text Interpretation: Sinus rhythm Left anterior fascicular block Abnormal R-wave progression, early transition Left ventricular hypertrophy no acute ischemia Confirmed by Lorre Munroe ([id number]) on 06/13/2021 7:29:37 PM  Radiology CT HEAD WO CONTRAST  Result Date: 06/13/2021 CLINICAL DATA:  TIA, dizziness EXAM: CT HEAD WITHOUT CONTRAST TECHNIQUE: Contiguous axial images were obtained from the base of the skull through the vertex without intravenous contrast. COMPARISON:  03/13/2012 FINDINGS: Brain: There is atrophy and chronic small vessel disease changes. No acute intracranial abnormality. Specifically, no hemorrhage, hydrocephalus, mass lesion, acute infarction, or significant intracranial injury. Vascular: No hyperdense vessel or unexpected calcification. Skull: No acute calvarial abnormality. Sinuses/Orbits: No acute findings Other: None IMPRESSION: Atrophy, chronic microvascular disease. No acute intracranial abnormality. Electronically Signed   By: Rolm Baptise M.D.   On: 06/13/2021 21:35    Procedures Procedures   Medications Ordered in ED Medications  chlorthalidone (HYGROTON) tablet 50 mg (has no administration in time range)  sodium chloride 0.9 % bolus 500 mL (0 mLs Intravenous Stopped 06/13/21 2116)  labetalol (NORMODYNE) injection 20 mg (20  mg Intravenous Given 06/13/21 1949)    ED Course  I have reviewed the triage vital signs and the nursing notes.  Pertinent labs & imaging results that were available during my care of the patient were reviewed by me and considered in my medical decision making (see chart for  details).    MDM Rules/Calculators/A&P                           Bailey [name] presented with a complaint of dizziness and possibly headache.  Her history was limited because she does suffer from some dementia, and she had very little recall of the symptoms she had endorsed earlier in the day.  When I saw her, she was asymptomatic.  The most striking thing about her ED presentation was her hypertension.  Otherwise, she had a normal exam including a normal neurologic exam.  She was evaluated for evidence of stroke.  CT imaging was negative.  That finding combined with her reported symptoms of lightheadedness and her normal neuro exam seems sufficient to exclude acute stroke.  No evidence of metabolic derangement.  Troponin did not reveal evidence of ACS.  Only 1 was taken since it was drawn over 8 hours after the onset of symptoms.  I did recommend starting a blood pressure medication.  Because she has sensitivities to numerous other antihypertensives, I did choose chlorthalidone.  I advised them to track her blood pressure at home, and I advised close PCP follow-up.  Return precautions were discussed extensively. Final Clinical Impression(s) / ED Diagnoses Final diagnoses:  Hypertensive urgency  Dizziness    Rx / DC Orders ED Discharge Orders          Ordered    chlorthalidone (HYGROTON) 25 MG tablet  Daily        06/13/21 2214             Arnaldo Natal, MD 06/13/21 2222

## 2021-06-13 NOTE — ED Notes (Signed)
Pt's husband at bedside notes that she was recently diagnosed with early stages of dementia.

## 2021-06-13 NOTE — ED Triage Notes (Signed)
Pt here for dizziness upon arriving at a restaurant approx 1 hour ago; pt with no obvious neuro deficits but having difficulty ambulating due to dizziness; pt with baseline memory problems; husband present; NP at bedside

## 2021-06-13 NOTE — Discharge Instructions (Addendum)
Patient discharged to the hospital via EMS for further evaluation and management.

## 2021-06-13 NOTE — ED Provider Notes (Signed)
Emergency Medicine Provider Triage Evaluation Note  Bailey [name] , a 80 y.o. female  was evaluated in triage.  Pt complains of dizziness.  She is reportedly at her baseline pleasantly confused.  She does eating and felt dizzy.  This resolved prior to arrival..  Review of Systems  Positive: Dizziness, that is now resolved Negative: Weakness, numbness, pain  Physical Exam  BP (!) 187/71 (BP Location: Right Arm)   Pulse (!) 56   Temp 98.7 F (37.1 C) (Oral)   Resp 15   LMP 06/23/2010 (Approximate)   SpO2 99%  Gen:   Awake, no distress   Resp:  Normal effort  MSK:   Moves extremities without difficulty  Other:  Normal finger-nose-finger bilaterally.  No pronator drift.  Facial movements are symmetric.  Speech is not slurred.   Medical Decision Making  Medically screening exam initiated at 1527.  Appropriate orders placed.  LITAL ZAPP was informed that the remainder of the evaluation will be completed by another provider, this initial triage assessment does not replace that evaluation, and the importance of remaining in the ED until their evaluation is complete.  Patient doesn't appear to have remaining neurodeficits based on available information.  She denies any dizziness currently. Patient does not appear to meet code stroke criteria.   Lorin Glass, [place] 06/13/21 1608    Tegeler, Gwenyth Allegra, MD 06/13/21 [phone]

## 2021-06-13 NOTE — ED Notes (Signed)
Pt forgot she was being treated and removed her IV. RN applied gauze and tape; bleeding controlled.

## 2021-06-13 NOTE — ED Notes (Signed)
Call to [facility] Radiology at [phone] to check status of CT scan ended @ 1959pm, still not resulted in Epic. Per tech, there has been an IT issue that they are working to resolve and they hope that final results can be uploaded soon. They are currently on the phone with [facility] CT and IT regarding this issue. Will notify MD.

## 2021-06-13 NOTE — ED Notes (Signed)
Offered food and drink in order to complete stroke swallow screen but pt declined at this time. Will offer again shortly.

## 2021-06-13 NOTE — ED Notes (Signed)
MD aware of continued BP elevation.

## 2021-06-13 NOTE — ED Notes (Signed)
Notified MD of elevated BP

## 2021-06-16 DIAGNOSIS — I10 Essential (primary) hypertension: Secondary | ICD-10-CM | POA: Diagnosis not present

## 2021-06-16 DIAGNOSIS — N289 Disorder of kidney and ureter, unspecified: Secondary | ICD-10-CM | POA: Diagnosis not present

## 2021-06-16 DIAGNOSIS — R42 Dizziness and giddiness: Secondary | ICD-10-CM | POA: Diagnosis not present

## 2021-06-16 DIAGNOSIS — F0390 Unspecified dementia without behavioral disturbance: Secondary | ICD-10-CM | POA: Diagnosis not present

## 2021-06-20 DIAGNOSIS — F0390 Unspecified dementia without behavioral disturbance: Secondary | ICD-10-CM | POA: Diagnosis not present

## 2021-06-20 DIAGNOSIS — K9089 Other intestinal malabsorption: Secondary | ICD-10-CM | POA: Diagnosis not present

## 2021-06-20 DIAGNOSIS — Z1211 Encounter for screening for malignant neoplasm of colon: Secondary | ICD-10-CM | POA: Diagnosis not present

## 2021-06-20 DIAGNOSIS — K52831 Collagenous colitis: Secondary | ICD-10-CM | POA: Diagnosis not present

## 2021-06-21 DIAGNOSIS — Z961 Presence of intraocular lens: Secondary | ICD-10-CM | POA: Diagnosis not present

## 2021-06-21 DIAGNOSIS — H35371 Puckering of macula, right eye: Secondary | ICD-10-CM | POA: Diagnosis not present

## 2021-06-30 DIAGNOSIS — I10 Essential (primary) hypertension: Secondary | ICD-10-CM | POA: Diagnosis not present

## 2021-06-30 DIAGNOSIS — D649 Anemia, unspecified: Secondary | ICD-10-CM | POA: Diagnosis not present

## 2021-06-30 DIAGNOSIS — F0390 Unspecified dementia without behavioral disturbance: Secondary | ICD-10-CM | POA: Diagnosis not present

## 2021-06-30 DIAGNOSIS — N289 Disorder of kidney and ureter, unspecified: Secondary | ICD-10-CM | POA: Diagnosis not present

## 2021-10-03 DIAGNOSIS — Z23 Encounter for immunization: Secondary | ICD-10-CM | POA: Diagnosis not present

## 2021-11-01 DIAGNOSIS — F0390 Unspecified dementia without behavioral disturbance: Secondary | ICD-10-CM | POA: Diagnosis not present

## 2021-11-01 DIAGNOSIS — K52831 Collagenous colitis: Secondary | ICD-10-CM | POA: Diagnosis not present

## 2021-11-01 DIAGNOSIS — K9089 Other intestinal malabsorption: Secondary | ICD-10-CM | POA: Diagnosis not present

## 2021-12-26 DIAGNOSIS — M859 Disorder of bone density and structure, unspecified: Secondary | ICD-10-CM | POA: Diagnosis not present

## 2021-12-26 DIAGNOSIS — I10 Essential (primary) hypertension: Secondary | ICD-10-CM | POA: Diagnosis not present

## 2021-12-26 DIAGNOSIS — E785 Hyperlipidemia, unspecified: Secondary | ICD-10-CM | POA: Diagnosis not present

## 2021-12-26 DIAGNOSIS — R7301 Impaired fasting glucose: Secondary | ICD-10-CM | POA: Diagnosis not present

## 2022-01-09 DIAGNOSIS — E785 Hyperlipidemia, unspecified: Secondary | ICD-10-CM | POA: Diagnosis not present

## 2022-01-09 DIAGNOSIS — G629 Polyneuropathy, unspecified: Secondary | ICD-10-CM | POA: Diagnosis not present

## 2022-01-09 DIAGNOSIS — Z0000 Encounter for general adult medical examination without abnormal findings: Secondary | ICD-10-CM | POA: Diagnosis not present

## 2022-01-09 DIAGNOSIS — E876 Hypokalemia: Secondary | ICD-10-CM | POA: Diagnosis not present

## 2022-02-02 DIAGNOSIS — Z780 Asymptomatic menopausal state: Secondary | ICD-10-CM | POA: Diagnosis not present

## 2022-02-02 DIAGNOSIS — Z79890 Hormone replacement therapy (postmenopausal): Secondary | ICD-10-CM | POA: Diagnosis not present

## 2022-02-09 DIAGNOSIS — Z85828 Personal history of other malignant neoplasm of skin: Secondary | ICD-10-CM | POA: Diagnosis not present

## 2022-02-09 DIAGNOSIS — D485 Neoplasm of uncertain behavior of skin: Secondary | ICD-10-CM | POA: Diagnosis not present

## 2022-02-09 DIAGNOSIS — C44519 Basal cell carcinoma of skin of other part of trunk: Secondary | ICD-10-CM | POA: Diagnosis not present

## 2022-02-09 DIAGNOSIS — C44612 Basal cell carcinoma of skin of right upper limb, including shoulder: Secondary | ICD-10-CM | POA: Diagnosis not present

## 2022-02-09 DIAGNOSIS — L821 Other seborrheic keratosis: Secondary | ICD-10-CM | POA: Diagnosis not present

## 2022-02-09 DIAGNOSIS — D1801 Hemangioma of skin and subcutaneous tissue: Secondary | ICD-10-CM | POA: Diagnosis not present

## 2022-03-01 ENCOUNTER — Other Ambulatory Visit (HOSPITAL_COMMUNITY): Payer: Self-pay | Admitting: *Deleted

## 2022-03-02 ENCOUNTER — Encounter (HOSPITAL_COMMUNITY): Payer: Self-pay

## 2022-03-02 ENCOUNTER — Encounter (HOSPITAL_COMMUNITY): Payer: Medicare Other

## 2022-03-09 ENCOUNTER — Other Ambulatory Visit (HOSPITAL_COMMUNITY): Payer: Self-pay | Admitting: *Deleted

## 2022-03-10 ENCOUNTER — Ambulatory Visit (HOSPITAL_COMMUNITY)
Admission: RE | Admit: 2022-03-10 | Discharge: 2022-03-10 | Disposition: A | Discharge status: Home / Self Care | Payer: Medicare Other | Source: Ambulatory Visit | Attending: Internal Medicine | Admitting: Internal Medicine

## 2022-03-10 DIAGNOSIS — M810 Age-related osteoporosis without current pathological fracture: Secondary | ICD-10-CM | POA: Diagnosis not present

## 2022-03-10 MED ORDER — DENOSUMAB 60 MG/ML ~~LOC~~ SOSY
PREFILLED_SYRINGE | SUBCUTANEOUS | Status: AC
  Filled 2022-03-10: qty 1

## 2022-03-10 MED ORDER — DENOSUMAB 60 MG/ML ~~LOC~~ SOSY
60.0000 mg | PREFILLED_SYRINGE | Freq: Once | SUBCUTANEOUS | Status: AC
  Administered 2022-03-10: 60 mg via SUBCUTANEOUS

## 2022-04-13 DIAGNOSIS — E876 Hypokalemia: Secondary | ICD-10-CM | POA: Diagnosis not present

## 2022-04-13 DIAGNOSIS — I10 Essential (primary) hypertension: Secondary | ICD-10-CM | POA: Diagnosis not present

## 2022-05-17 ENCOUNTER — Other Ambulatory Visit: Payer: Self-pay

## 2022-05-17 ENCOUNTER — Ambulatory Visit: Payer: Self-pay

## 2022-05-17 ENCOUNTER — Encounter (HOSPITAL_BASED_OUTPATIENT_CLINIC_OR_DEPARTMENT_OTHER): Payer: Self-pay

## 2022-05-17 ENCOUNTER — Emergency Department (HOSPITAL_BASED_OUTPATIENT_CLINIC_OR_DEPARTMENT_OTHER)
Admission: EM | Admit: 2022-05-17 | Discharge: 2022-05-17 | Disposition: A | Discharge status: Home / Self Care | Payer: Medicare Other | Attending: Emergency Medicine | Admitting: Emergency Medicine

## 2022-05-17 DIAGNOSIS — Z79899 Other long term (current) drug therapy: Secondary | ICD-10-CM | POA: Diagnosis not present

## 2022-05-17 DIAGNOSIS — F0390 Unspecified dementia without behavioral disturbance: Secondary | ICD-10-CM | POA: Diagnosis not present

## 2022-05-17 DIAGNOSIS — I10 Essential (primary) hypertension: Secondary | ICD-10-CM | POA: Insufficient documentation

## 2022-05-17 DIAGNOSIS — R4182 Altered mental status, unspecified: Secondary | ICD-10-CM | POA: Diagnosis not present

## 2022-05-17 DIAGNOSIS — R4586 Emotional lability: Secondary | ICD-10-CM | POA: Diagnosis not present

## 2022-05-17 DIAGNOSIS — F39 Unspecified mood [affective] disorder: Secondary | ICD-10-CM | POA: Diagnosis not present

## 2022-05-17 HISTORY — DX: Unspecified dementia, unspecified severity, without behavioral disturbance, psychotic disturbance, mood disturbance, and anxiety: F03.90

## 2022-05-17 LAB — URINALYSIS, ROUTINE W REFLEX MICROSCOPIC
Bilirubin Urine: NEGATIVE
Glucose, UA: NEGATIVE mg/dL
Hgb urine dipstick: NEGATIVE
Ketones, ur: NEGATIVE mg/dL
Leukocytes,Ua: NEGATIVE
Nitrite: NEGATIVE
Protein, ur: NEGATIVE mg/dL
Specific Gravity, Urine: 1.007 (ref 1.005–1.030)
pH: 5 (ref 5.0–8.0)

## 2022-05-17 NOTE — Telephone Encounter (Signed)
  Chief Complaint: UTI Symptoms: urinary discomfort, odor, confusion and aggressive behavior Frequency:  Pertinent Negatives: NA Disposition: '[x]'$ ED /'[]'$ Urgent Care (no appt availability in office) / '[]'$ Appointment(In office/virtual)/ '[]'$  [place] [facility]/ '[]'$ Home Care/ '[]'$ Refused Recommended Disposition /'[]'$ [facility]/ '[]'$  Follow-up with PCP Additional Notes: spoke with pt's daughter, advised her she wasn't on DPR. She states pt is confused and having aggressive behavior and yelling at husband. Advised that she can take pt to ED. Caregiver is on the way with pt to [facility] ED and daughter Colletta [place] is out of town but wanted to know what was best for them to do with pt.   Summary: urinary discomfort / urine sample concerns   The patient's daughter has called on the community line   The patient has dementia) is suspected of having urinary discomfort   The patients caregiver has noticed cognitive decline as well as odor for the past few days   The patient's daughter would like to know what is needed to get a urine sample for the patient who has become increasingly uncooperative   Please contact further when possible        Reason for Disposition  Patient sounds very sick or weak to the triager  Answer Assessment - Initial Assessment Questions 1. SYMPTOM: "What's the main symptom you're concerned about?" (e.g., frequency, incontinence)     Urinary discomfort  3. PAIN: "Is there any pain?" If Yes, ask: "How bad is it?" (Scale: 1-10; mild, moderate, severe)     Mild to moderate 4. CAUSE: "What do you think is causing the symptoms?"     UTI 5. OTHER SYMPTOMS: "Do you have any other symptoms?" (e.g., blood in urine, fever, flank pain, pain with urination)     Aggressive behavior, pain with urination, odor  Protocols used: Urinary Symptoms-A-AH

## 2022-05-17 NOTE — ED Triage Notes (Signed)
States over last three days have become more confused.  States has dementia but "more out of sort" Patient appears confused but pleasant. Worried about UTI

## 2022-05-17 NOTE — ED Provider Notes (Signed)
[place] EMERGENCY DEPT Provider Note   CSN: [id number] Arrival date & time: 05/17/22  1724     History  Chief Complaint  Patient presents with   Altered Mental Status    increased    Bailey [name] is a 81 y.o. female.  Patient is an 81 year old female with a history of hypertension, hyperlipidemia, aortic insufficiency, colitis, GERD, early dementia who is presenting today with her family member because they report in the last few days patient has had more behavioral disturbance especially in the evenings.  She has been very agitated and angry with her husband.  Family wanted to ensure there was nothing else going on.  They do note that she has been off of her amlodipine since Friday because they checked her on that day and her blood pressure was low 80 systolic.  She was supposed to take half a tablet but it kept crumbling so she had not had any amlodipine till today when she took a 2.5 mg tablet.  She has not had any diarrhea, vomiting, fever, cough, congestion, shortness of breath.  Patient has no complaints at this time.  There is been no other medication changes.  She has been eating and drinking regularly.  The history is provided by the patient, a relative and a caregiver.  Altered Mental Status      Home Medications Prior to Admission medications   Medication Sig Start Date End Date Taking? Authorizing Provider  amoxicillin (AMOXIL) 500 MG capsule Take 1 capsule by mouth as needed (AS NEEDED FOR DENTAL PROCEDURES).  Patient not taking: Reported on 06/13/2021 03/16/15   [provider]  budesonide (ENTOCORT EC) 3 MG 24 hr capsule Take 9 mg by mouth daily.  09/24/18   [provider]  chlorthalidone (HYGROTON) 25 MG tablet Take 1 tablet (25 mg total) by mouth daily. 06/13/21 07/13/21  Arnaldo Natal, MD  COVID-19 mRNA vaccine, Moderna, 100 MCG/0.5ML injection Inject into the muscle. 05/03/21   Carlyle Basques, MD  escitalopram (LEXAPRO) 10 MG  tablet Take 10 mg by mouth daily.  11/25/18   [provider]  furosemide (LASIX) 20 MG tablet Take 20 mg by mouth daily. 04/02/21   [provider]  losartan (COZAAR) 100 MG tablet Take 100 mg by mouth daily. 05/09/21   [provider]  losartan (COZAAR) 50 MG tablet Take 1 tablet by mouth daily. 06/25/19   [provider]  metoprolol succinate (TOPROL XL) 25 MG 24 hr tablet Take 1 tablet (25 mg total) by mouth daily. 07/08/19   Nahser, Wonda Cheng, MD  metoprolol succinate (TOPROL-XL) 50 MG 24 hr tablet Take 50 mg by mouth daily. 06/01/21   [provider]  Multiple Vitamin (MULITIVITAMIN WITH MINERALS) TABS Take 1 tablet by mouth daily.    [provider]  pantoprazole (PROTONIX) 40 MG tablet Take 40 mg by mouth daily. 06/01/21   [provider]  potassium chloride (KLOR-CON) 10 MEQ tablet Take 10 mEq by mouth daily. 10/01/19   [provider]  rivastigmine (EXELON) 9.5 mg/24hr  09/23/18   [provider]      Allergies    Advil [ibuprofen], Alendronate sodium, Amlodipine, Aspirin, Cozaar [losartan], Exelon [rivastigmine], Fexofenadine, Hydrochlorothiazide, Ibuprofen, Lisinopril, Loratadine, Metronidazole, Olmesartan, Ondansetron, Pantoprazole sodium, and Simvastatin    Review of Systems   Review of Systems  Physical Exam Updated Vital Signs BP (!) 176/68 (BP Location: Right Arm)   Pulse (!) 54   Temp 97.7 F (36.5 C) (Oral)  Resp 15   Ht 5' 2.25" (1.581 m)   Wt 45.8 kg   LMP 06/23/2010 (Approximate)   SpO2 99%   BMI 18.32 kg/m  Physical Exam Vitals and nursing note reviewed.  Constitutional:      General: She is not in acute distress.    Appearance: She is well-developed.  HENT:     Head: Normocephalic and atraumatic.     Right Ear: Tympanic membrane normal.     Left Ear: Tympanic membrane normal.  Eyes:     Conjunctiva/sclera: Conjunctivae normal.     Pupils: Pupils are equal, round, and reactive to  light.  Cardiovascular:     Rate and Rhythm: Normal rate and regular rhythm.     Heart sounds: No murmur heard. Pulmonary:     Effort: Pulmonary effort is normal. No respiratory distress.     Breath sounds: Normal breath sounds. No wheezing or rales.  Abdominal:     General: There is no distension.     Palpations: Abdomen is soft.     Tenderness: There is no abdominal tenderness. There is no guarding or rebound.  Musculoskeletal:        General: No tenderness. Normal range of motion.     Cervical back: Normal range of motion and neck supple.     Right lower leg: No edema.     Left lower leg: No edema.  Skin:    General: Skin is warm and dry.     Findings: No erythema or rash.  Neurological:     Mental Status: She is alert and oriented to person, place, and time. Mental status is at baseline.  Psychiatric:        Mood and Affect: Mood normal.        Behavior: Behavior normal.     ED Results / Procedures / Treatments   Labs (all labs ordered are listed, but only abnormal results are displayed) Labs Reviewed  URINALYSIS, ROUTINE W REFLEX MICROSCOPIC    EKG None  Radiology No results found.  Procedures Procedures    Medications Ordered in ED Medications - No data to display  ED Course/ Medical Decision Making/ A&P                           Medical Decision Making Amount and/or Complexity of Data Reviewed Independent Historian: caregiver External Data Reviewed: notes.    Details: pcp Labs: ordered. Decision-making details documented in ED Course.   Elderly female presenting today with her family member due to more behavioral disturbances mostly in the evenings.  They wanted to ensure there was no other acute causes because they thought her urine smelled somewhat strongly.  I independently interpreted her lab test today and her urine is normal.  Her exam without any acute findings.  Patient is calm and cooperative on exam.  The only thing that was noted was she is  hypertensive today at 176/68 however she has been off of her blood pressure medication for almost a week because she was waiting to get the tablets for a lower dose did discuss with her family member that this could be adding into her symptoms or it may just be sundowning with dementia.  At this time patient does not appear to need any further testing.  She has no social determinants affecting her discharge today.  They will continue to monitor her blood pressure and follow-up with her PCP if her blood pressure does not improve to  a more normal number.        Final Clinical Impression(s) / ED Diagnoses Final diagnoses:  Mood swings    Rx / DC Orders ED Discharge Orders     None         Blanchie Dessert, MD 05/17/22 1949

## 2022-05-17 NOTE — Discharge Instructions (Signed)
The urine is normal today.  Check the blood pressure daily for the next 1-2 weeks.  If it continues to be high may need to go back to the other dose

## 2022-05-17 NOTE — ED Notes (Signed)
Pt requesting to have urine checked for possible UTI.

## 2022-06-14 DIAGNOSIS — K52831 Collagenous colitis: Secondary | ICD-10-CM | POA: Diagnosis not present

## 2022-06-14 DIAGNOSIS — Z79899 Other long term (current) drug therapy: Secondary | ICD-10-CM | POA: Diagnosis not present

## 2022-07-21 DIAGNOSIS — L03116 Cellulitis of left lower limb: Secondary | ICD-10-CM | POA: Diagnosis not present

## 2022-07-21 DIAGNOSIS — M7989 Other specified soft tissue disorders: Secondary | ICD-10-CM | POA: Diagnosis not present

## 2022-07-21 DIAGNOSIS — M79672 Pain in left foot: Secondary | ICD-10-CM | POA: Diagnosis not present

## 2022-08-01 DIAGNOSIS — M79672 Pain in left foot: Secondary | ICD-10-CM | POA: Diagnosis not present

## 2022-08-15 DIAGNOSIS — S92325A Nondisplaced fracture of second metatarsal bone, left foot, initial encounter for closed fracture: Secondary | ICD-10-CM | POA: Diagnosis not present

## 2022-08-15 DIAGNOSIS — M79672 Pain in left foot: Secondary | ICD-10-CM | POA: Diagnosis not present

## 2022-09-12 DIAGNOSIS — S92325A Nondisplaced fracture of second metatarsal bone, left foot, initial encounter for closed fracture: Secondary | ICD-10-CM | POA: Diagnosis not present

## 2022-11-14 DIAGNOSIS — Z23 Encounter for immunization: Secondary | ICD-10-CM | POA: Diagnosis not present

## 2022-11-14 DIAGNOSIS — Q231 Congenital insufficiency of aortic valve: Secondary | ICD-10-CM | POA: Diagnosis not present

## 2022-11-14 DIAGNOSIS — R7301 Impaired fasting glucose: Secondary | ICD-10-CM | POA: Diagnosis not present

## 2022-12-14 DIAGNOSIS — M25552 Pain in left hip: Secondary | ICD-10-CM | POA: Diagnosis not present

## 2022-12-15 ENCOUNTER — Other Ambulatory Visit (HOSPITAL_COMMUNITY): Payer: Self-pay | Admitting: *Deleted

## 2022-12-19 ENCOUNTER — Ambulatory Visit (HOSPITAL_COMMUNITY)
Admission: RE | Admit: 2022-12-19 | Discharge: 2022-12-19 | Disposition: A | Discharge status: Home / Self Care | Payer: Medicare Other | Source: Ambulatory Visit | Attending: Internal Medicine | Admitting: Internal Medicine

## 2022-12-19 DIAGNOSIS — M810 Age-related osteoporosis without current pathological fracture: Secondary | ICD-10-CM | POA: Diagnosis not present

## 2022-12-19 MED ORDER — DENOSUMAB 60 MG/ML ~~LOC~~ SOSY: 60.0000 mg | PREFILLED_SYRINGE | Freq: Once | SUBCUTANEOUS | Status: AC

## 2022-12-19 MED ORDER — DENOSUMAB 60 MG/ML ~~LOC~~ SOSY
PREFILLED_SYRINGE | SUBCUTANEOUS | Status: AC
  Administered 2022-12-19: 60 mg via SUBCUTANEOUS
  Filled 2022-12-19: qty 1

## 2022-12-25 DIAGNOSIS — M7062 Trochanteric bursitis, left hip: Secondary | ICD-10-CM | POA: Diagnosis not present

## 2023-01-23 DIAGNOSIS — K9089 Other intestinal malabsorption: Secondary | ICD-10-CM | POA: Diagnosis not present

## 2023-01-23 DIAGNOSIS — K52831 Collagenous colitis: Secondary | ICD-10-CM | POA: Diagnosis not present

## 2023-02-06 DIAGNOSIS — I10 Essential (primary) hypertension: Secondary | ICD-10-CM | POA: Diagnosis not present

## 2023-02-06 DIAGNOSIS — R634 Abnormal weight loss: Secondary | ICD-10-CM | POA: Diagnosis not present

## 2023-02-06 DIAGNOSIS — R7301 Impaired fasting glucose: Secondary | ICD-10-CM | POA: Diagnosis not present

## 2023-02-06 DIAGNOSIS — R7989 Other specified abnormal findings of blood chemistry: Secondary | ICD-10-CM | POA: Diagnosis not present

## 2023-02-06 DIAGNOSIS — D649 Anemia, unspecified: Secondary | ICD-10-CM | POA: Diagnosis not present

## 2023-02-06 DIAGNOSIS — E785 Hyperlipidemia, unspecified: Secondary | ICD-10-CM | POA: Diagnosis not present

## 2023-02-13 DIAGNOSIS — Z1339 Encounter for screening examination for other mental health and behavioral disorders: Secondary | ICD-10-CM | POA: Diagnosis not present

## 2023-02-13 DIAGNOSIS — G309 Alzheimer's disease, unspecified: Secondary | ICD-10-CM | POA: Diagnosis not present

## 2023-02-13 DIAGNOSIS — Z0000 Encounter for general adult medical examination without abnormal findings: Secondary | ICD-10-CM | POA: Diagnosis not present

## 2023-02-13 DIAGNOSIS — Z23 Encounter for immunization: Secondary | ICD-10-CM | POA: Diagnosis not present

## 2023-02-13 DIAGNOSIS — D649 Anemia, unspecified: Secondary | ICD-10-CM | POA: Diagnosis not present

## 2023-02-13 DIAGNOSIS — I10 Essential (primary) hypertension: Secondary | ICD-10-CM | POA: Diagnosis not present

## 2023-02-13 DIAGNOSIS — Q231 Congenital insufficiency of aortic valve: Secondary | ICD-10-CM | POA: Diagnosis not present

## 2023-02-13 DIAGNOSIS — H9190 Unspecified hearing loss, unspecified ear: Secondary | ICD-10-CM | POA: Diagnosis not present

## 2023-02-13 DIAGNOSIS — Z1331 Encounter for screening for depression: Secondary | ICD-10-CM | POA: Diagnosis not present

## 2023-03-08 DIAGNOSIS — Z961 Presence of intraocular lens: Secondary | ICD-10-CM | POA: Diagnosis not present

## 2023-03-08 DIAGNOSIS — H02052 Trichiasis without entropian right lower eyelid: Secondary | ICD-10-CM | POA: Diagnosis not present

## 2023-04-12 DIAGNOSIS — D692 Other nonthrombocytopenic purpura: Secondary | ICD-10-CM | POA: Diagnosis not present

## 2023-04-12 DIAGNOSIS — Z85828 Personal history of other malignant neoplasm of skin: Secondary | ICD-10-CM | POA: Diagnosis not present

## 2023-04-12 DIAGNOSIS — L570 Actinic keratosis: Secondary | ICD-10-CM | POA: Diagnosis not present

## 2023-04-12 DIAGNOSIS — D1801 Hemangioma of skin and subcutaneous tissue: Secondary | ICD-10-CM | POA: Diagnosis not present

## 2023-04-12 DIAGNOSIS — L821 Other seborrheic keratosis: Secondary | ICD-10-CM | POA: Diagnosis not present

## 2023-07-16 ENCOUNTER — Other Ambulatory Visit (HOSPITAL_COMMUNITY): Payer: Self-pay | Admitting: *Deleted

## 2023-07-19 ENCOUNTER — Ambulatory Visit (HOSPITAL_COMMUNITY)
Admission: RE | Admit: 2023-07-19 | Discharge: 2023-07-19 | Disposition: A | Discharge status: Home / Self Care | Payer: Medicare Other | Source: Ambulatory Visit | Attending: Internal Medicine | Admitting: Internal Medicine

## 2023-07-19 DIAGNOSIS — M810 Age-related osteoporosis without current pathological fracture: Secondary | ICD-10-CM | POA: Insufficient documentation

## 2023-07-19 MED ORDER — DENOSUMAB 60 MG/ML ~~LOC~~ SOSY
60.0000 mg | PREFILLED_SYRINGE | Freq: Once | SUBCUTANEOUS | Status: AC
  Administered 2023-07-19: 60 mg via SUBCUTANEOUS

## 2023-07-19 MED ORDER — DENOSUMAB 60 MG/ML ~~LOC~~ SOSY
PREFILLED_SYRINGE | SUBCUTANEOUS | Status: AC
  Filled 2023-07-19: qty 1

## 2023-08-02 ENCOUNTER — Telehealth: Payer: Self-pay

## 2023-08-02 NOTE — Telephone Encounter (Signed)
We received a PA for patient Budesonide 3mg   through cover my meds for provider Celso Amy. Patient has never been seen at our office so unable to do the PA for patient

## 2023-10-11 ENCOUNTER — Emergency Department (HOSPITAL_COMMUNITY): Payer: Medicare Other

## 2023-10-11 ENCOUNTER — Other Ambulatory Visit: Payer: Self-pay

## 2023-10-11 ENCOUNTER — Encounter (HOSPITAL_COMMUNITY): Payer: Self-pay

## 2023-10-11 ENCOUNTER — Inpatient Hospital Stay (HOSPITAL_COMMUNITY)
Admission: EM | Admit: 2023-10-11 | Discharge: 2023-10-18 | DRG: 565 | Disposition: A | Discharge status: Skilled Nursing Facility | Payer: Medicare Other | Attending: Family Medicine | Admitting: Family Medicine

## 2023-10-11 ENCOUNTER — Observation Stay (HOSPITAL_COMMUNITY): Payer: Medicare Other

## 2023-10-11 DIAGNOSIS — M25511 Pain in right shoulder: Secondary | ICD-10-CM | POA: Diagnosis not present

## 2023-10-11 DIAGNOSIS — F32A Depression, unspecified: Secondary | ICD-10-CM | POA: Diagnosis present

## 2023-10-11 DIAGNOSIS — Z043 Encounter for examination and observation following other accident: Secondary | ICD-10-CM | POA: Diagnosis not present

## 2023-10-11 DIAGNOSIS — D649 Anemia, unspecified: Secondary | ICD-10-CM | POA: Diagnosis present

## 2023-10-11 DIAGNOSIS — H9313 Tinnitus, bilateral: Secondary | ICD-10-CM | POA: Diagnosis not present

## 2023-10-11 DIAGNOSIS — F419 Anxiety disorder, unspecified: Secondary | ICD-10-CM | POA: Diagnosis present

## 2023-10-11 DIAGNOSIS — Z7952 Long term (current) use of systemic steroids: Secondary | ICD-10-CM | POA: Diagnosis not present

## 2023-10-11 DIAGNOSIS — I351 Nonrheumatic aortic (valve) insufficiency: Secondary | ICD-10-CM | POA: Diagnosis not present

## 2023-10-11 DIAGNOSIS — E890 Postprocedural hypothyroidism: Secondary | ICD-10-CM | POA: Diagnosis present

## 2023-10-11 DIAGNOSIS — S22030A Wedge compression fracture of third thoracic vertebra, initial encounter for closed fracture: Secondary | ICD-10-CM | POA: Diagnosis not present

## 2023-10-11 DIAGNOSIS — R001 Bradycardia, unspecified: Secondary | ICD-10-CM | POA: Diagnosis not present

## 2023-10-11 DIAGNOSIS — Z79899 Other long term (current) drug therapy: Secondary | ICD-10-CM | POA: Diagnosis not present

## 2023-10-11 DIAGNOSIS — S42111A Displaced fracture of body of scapula, right shoulder, initial encounter for closed fracture: Secondary | ICD-10-CM | POA: Diagnosis not present

## 2023-10-11 DIAGNOSIS — Z8249 Family history of ischemic heart disease and other diseases of the circulatory system: Secondary | ICD-10-CM

## 2023-10-11 DIAGNOSIS — M19042 Primary osteoarthritis, left hand: Secondary | ICD-10-CM | POA: Diagnosis not present

## 2023-10-11 DIAGNOSIS — I495 Sick sinus syndrome: Secondary | ICD-10-CM | POA: Diagnosis not present

## 2023-10-11 DIAGNOSIS — S42101A Fracture of unspecified part of scapula, right shoulder, initial encounter for closed fracture: Secondary | ICD-10-CM | POA: Diagnosis present

## 2023-10-11 DIAGNOSIS — S42191A Fracture of other part of scapula, right shoulder, initial encounter for closed fracture: Secondary | ICD-10-CM | POA: Diagnosis not present

## 2023-10-11 DIAGNOSIS — W19XXXD Unspecified fall, subsequent encounter: Secondary | ICD-10-CM | POA: Diagnosis not present

## 2023-10-11 DIAGNOSIS — S2241XA Multiple fractures of ribs, right side, initial encounter for closed fracture: Secondary | ICD-10-CM | POA: Diagnosis not present

## 2023-10-11 DIAGNOSIS — R9089 Other abnormal findings on diagnostic imaging of central nervous system: Secondary | ICD-10-CM | POA: Diagnosis not present

## 2023-10-11 DIAGNOSIS — Z8349 Family history of other endocrine, nutritional and metabolic diseases: Secondary | ICD-10-CM

## 2023-10-11 DIAGNOSIS — G9009 Other idiopathic peripheral autonomic neuropathy: Secondary | ICD-10-CM | POA: Diagnosis not present

## 2023-10-11 DIAGNOSIS — M79603 Pain in arm, unspecified: Secondary | ICD-10-CM | POA: Diagnosis not present

## 2023-10-11 DIAGNOSIS — Z7401 Bed confinement status: Secondary | ICD-10-CM | POA: Diagnosis not present

## 2023-10-11 DIAGNOSIS — J9811 Atelectasis: Secondary | ICD-10-CM | POA: Diagnosis not present

## 2023-10-11 DIAGNOSIS — M4854XA Collapsed vertebra, not elsewhere classified, thoracic region, initial encounter for fracture: Secondary | ICD-10-CM | POA: Diagnosis not present

## 2023-10-11 DIAGNOSIS — Q2381 Bicuspid aortic valve: Secondary | ICD-10-CM

## 2023-10-11 DIAGNOSIS — Z888 Allergy status to other drugs, medicaments and biological substances status: Secondary | ICD-10-CM

## 2023-10-11 DIAGNOSIS — F01518 Vascular dementia, unspecified severity, with other behavioral disturbance: Secondary | ICD-10-CM | POA: Diagnosis not present

## 2023-10-11 DIAGNOSIS — J929 Pleural plaque without asbestos: Secondary | ICD-10-CM | POA: Diagnosis not present

## 2023-10-11 DIAGNOSIS — S0990XA Unspecified injury of head, initial encounter: Secondary | ICD-10-CM | POA: Diagnosis not present

## 2023-10-11 DIAGNOSIS — W1839XA Other fall on same level, initial encounter: Secondary | ICD-10-CM | POA: Diagnosis present

## 2023-10-11 DIAGNOSIS — R296 Repeated falls: Secondary | ICD-10-CM | POA: Diagnosis not present

## 2023-10-11 DIAGNOSIS — R911 Solitary pulmonary nodule: Secondary | ICD-10-CM | POA: Diagnosis not present

## 2023-10-11 DIAGNOSIS — M19041 Primary osteoarthritis, right hand: Secondary | ICD-10-CM | POA: Diagnosis not present

## 2023-10-11 DIAGNOSIS — R2681 Unsteadiness on feet: Secondary | ICD-10-CM | POA: Diagnosis not present

## 2023-10-11 DIAGNOSIS — Z886 Allergy status to analgesic agent status: Secondary | ICD-10-CM | POA: Diagnosis not present

## 2023-10-11 DIAGNOSIS — Z66 Do not resuscitate: Secondary | ICD-10-CM | POA: Diagnosis present

## 2023-10-11 DIAGNOSIS — S22030D Wedge compression fracture of third thoracic vertebra, subsequent encounter for fracture with routine healing: Secondary | ICD-10-CM | POA: Diagnosis not present

## 2023-10-11 DIAGNOSIS — M6281 Muscle weakness (generalized): Secondary | ICD-10-CM | POA: Diagnosis not present

## 2023-10-11 DIAGNOSIS — E876 Hypokalemia: Secondary | ICD-10-CM | POA: Diagnosis not present

## 2023-10-11 DIAGNOSIS — S22000A Wedge compression fracture of unspecified thoracic vertebra, initial encounter for closed fracture: Secondary | ICD-10-CM | POA: Diagnosis present

## 2023-10-11 DIAGNOSIS — M5136 Other intervertebral disc degeneration, lumbar region with discogenic back pain only: Secondary | ICD-10-CM | POA: Diagnosis not present

## 2023-10-11 DIAGNOSIS — Z85828 Personal history of other malignant neoplasm of skin: Secondary | ICD-10-CM | POA: Diagnosis not present

## 2023-10-11 DIAGNOSIS — I10 Essential (primary) hypertension: Principal | ICD-10-CM | POA: Diagnosis present

## 2023-10-11 DIAGNOSIS — R4182 Altered mental status, unspecified: Secondary | ICD-10-CM | POA: Diagnosis not present

## 2023-10-11 DIAGNOSIS — W19XXXA Unspecified fall, initial encounter: Secondary | ICD-10-CM | POA: Diagnosis not present

## 2023-10-11 DIAGNOSIS — R58 Hemorrhage, not elsewhere classified: Secondary | ICD-10-CM | POA: Diagnosis not present

## 2023-10-11 DIAGNOSIS — R41841 Cognitive communication deficit: Secondary | ICD-10-CM | POA: Diagnosis not present

## 2023-10-11 DIAGNOSIS — I700 Atherosclerosis of aorta: Secondary | ICD-10-CM | POA: Diagnosis not present

## 2023-10-11 DIAGNOSIS — K219 Gastro-esophageal reflux disease without esophagitis: Secondary | ICD-10-CM | POA: Diagnosis present

## 2023-10-11 DIAGNOSIS — F0393 Unspecified dementia, unspecified severity, with mood disturbance: Secondary | ICD-10-CM | POA: Diagnosis not present

## 2023-10-11 DIAGNOSIS — T447X5A Adverse effect of beta-adrenoreceptor antagonists, initial encounter: Secondary | ICD-10-CM | POA: Diagnosis not present

## 2023-10-11 DIAGNOSIS — M549 Dorsalgia, unspecified: Secondary | ICD-10-CM | POA: Diagnosis not present

## 2023-10-11 DIAGNOSIS — Y92012 Bathroom of single-family (private) house as the place of occurrence of the external cause: Secondary | ICD-10-CM

## 2023-10-11 DIAGNOSIS — E785 Hyperlipidemia, unspecified: Secondary | ICD-10-CM | POA: Diagnosis not present

## 2023-10-11 LAB — CBC WITH DIFFERENTIAL/PLATELET
Abs Immature Granulocytes: 0.09 10*3/uL — ABNORMAL HIGH (ref 0.00–0.07)
Basophils Absolute: 0 10*3/uL (ref 0.0–0.1)
Basophils Relative: 0 %
Eosinophils Absolute: 0 10*3/uL (ref 0.0–0.5)
Eosinophils Relative: 0 %
HCT: 35 % — ABNORMAL LOW (ref 36.0–46.0)
Hemoglobin: 11.7 g/dL — ABNORMAL LOW (ref 12.0–15.0)
Immature Granulocytes: 1 %
Lymphocytes Relative: 9 %
Lymphs Abs: 1.1 10*3/uL (ref 0.7–4.0)
MCH: 31.1 pg (ref 26.0–34.0)
MCHC: 33.4 g/dL (ref 30.0–36.0)
MCV: 93.1 fL (ref 80.0–100.0)
Monocytes Absolute: 0.7 10*3/uL (ref 0.1–1.0)
Monocytes Relative: 6 %
Neutro Abs: 9.4 10*3/uL — ABNORMAL HIGH (ref 1.7–7.7)
Neutrophils Relative %: 84 %
Platelets: 145 10*3/uL — ABNORMAL LOW (ref 150–400)
RBC: 3.76 MIL/uL — ABNORMAL LOW (ref 3.87–5.11)
RDW: 12.9 % (ref 11.5–15.5)
WBC: 11.3 10*3/uL — ABNORMAL HIGH (ref 4.0–10.5)
nRBC: 0 % (ref 0.0–0.2)

## 2023-10-11 LAB — BASIC METABOLIC PANEL
Anion gap: 9 (ref 5–15)
BUN: 23 mg/dL (ref 8–23)
CO2: 23 mmol/L (ref 22–32)
Calcium: 8.4 mg/dL — ABNORMAL LOW (ref 8.9–10.3)
Chloride: 107 mmol/L (ref 98–111)
Creatinine, Ser: 0.83 mg/dL (ref 0.44–1.00)
GFR, Estimated: >60 mL/min (ref >60)
Glucose, Bld: 105 mg/dL — ABNORMAL HIGH (ref 70–99)
Potassium: 3.2 mmol/L — ABNORMAL LOW (ref 3.5–5.1)
Sodium: 139 mmol/L (ref 135–145)

## 2023-10-11 MED ORDER — ENOXAPARIN SODIUM 40 MG/0.4ML IJ SOSY
40.0000 mg | PREFILLED_SYRINGE | Freq: Every day | INTRAMUSCULAR | Status: DC
  Administered 2023-10-11 – 2023-10-17 (×5): 40 mg via SUBCUTANEOUS
  Filled 2023-10-11 (×7): qty 0.4

## 2023-10-11 MED ORDER — FUROSEMIDE 20 MG PO TABS
20.0000 mg | ORAL_TABLET | Freq: Every day | ORAL | Status: DC
  Administered 2023-10-12 – 2023-10-18 (×7): 20 mg via ORAL
  Filled 2023-10-11 (×7): qty 1

## 2023-10-11 MED ORDER — LABETALOL HCL 5 MG/ML IV SOLN
10.0000 mg | INTRAVENOUS | Status: DC | PRN
  Administered 2023-10-13 – 2023-10-15 (×3): 10 mg via INTRAVENOUS
  Filled 2023-10-11 (×4): qty 4

## 2023-10-11 MED ORDER — FENTANYL CITRATE PF 50 MCG/ML IJ SOSY: 12.5000 ug | PREFILLED_SYRINGE | INTRAMUSCULAR | Status: DC | PRN

## 2023-10-11 MED ORDER — ACETAMINOPHEN 325 MG PO TABS
650.0000 mg | ORAL_TABLET | Freq: Four times a day (QID) | ORAL | Status: DC | PRN
  Administered 2023-10-11 – 2023-10-16 (×5): 650 mg via ORAL
  Filled 2023-10-11 (×6): qty 2

## 2023-10-11 MED ORDER — HYDRALAZINE HCL 20 MG/ML IJ SOLN
10.0000 mg | Freq: Four times a day (QID) | INTRAMUSCULAR | Status: DC | PRN
  Administered 2023-10-12 – 2023-10-16 (×5): 10 mg via INTRAVENOUS
  Filled 2023-10-11 (×6): qty 1

## 2023-10-11 MED ORDER — METOPROLOL SUCCINATE ER 50 MG PO TB24
50.0000 mg | ORAL_TABLET | Freq: Every day | ORAL | Status: DC
  Administered 2023-10-11 – 2023-10-14 (×4): 50 mg via ORAL
  Filled 2023-10-11 (×4): qty 1

## 2023-10-11 MED ORDER — MORPHINE SULFATE (PF) 2 MG/ML IV SOLN
2.0000 mg | Freq: Once | INTRAVENOUS | Status: AC
Start: 2023-10-11 — End: 2023-10-11
  Administered 2023-10-11: 2 mg via INTRAVENOUS
  Filled 2023-10-11: qty 1

## 2023-10-11 MED ORDER — HYDRALAZINE HCL 20 MG/ML IJ SOLN
5.0000 mg | Freq: Four times a day (QID) | INTRAMUSCULAR | Status: DC | PRN
  Administered 2023-10-11: 5 mg via INTRAVENOUS
  Filled 2023-10-11: qty 1

## 2023-10-11 MED ORDER — POLYETHYLENE GLYCOL 3350 17 G PO PACK
17.0000 g | PACK | Freq: Every day | ORAL | Status: DC | PRN
  Administered 2023-10-18: 17 g via ORAL
  Filled 2023-10-11: qty 1

## 2023-10-11 MED ORDER — OXYCODONE HCL 5 MG PO TABS
5.0000 mg | ORAL_TABLET | ORAL | Status: DC | PRN
  Administered 2023-10-11 – 2023-10-18 (×10): 5 mg via ORAL
  Filled 2023-10-11 (×10): qty 1

## 2023-10-11 MED ORDER — HYDRALAZINE HCL 10 MG PO TABS
10.0000 mg | ORAL_TABLET | Freq: Once | ORAL | Status: AC
  Administered 2023-10-11: 10 mg via ORAL
  Filled 2023-10-11: qty 1

## 2023-10-11 MED ORDER — POTASSIUM CHLORIDE CRYS ER 20 MEQ PO TBCR
40.0000 meq | EXTENDED_RELEASE_TABLET | Freq: Once | ORAL | Status: AC
  Administered 2023-10-11: 40 meq via ORAL
  Filled 2023-10-11: qty 2

## 2023-10-11 MED ORDER — METOPROLOL SUCCINATE ER 50 MG PO TB24: 50.0000 mg | ORAL_TABLET | Freq: Every day | ORAL | Status: DC

## 2023-10-11 MED ORDER — BACITRACIN ZINC 500 UNIT/GM EX OINT
TOPICAL_OINTMENT | Freq: Once | CUTANEOUS | Status: AC
  Filled 2023-10-11: qty 1.8

## 2023-10-11 MED ORDER — ACETAMINOPHEN 650 MG RE SUPP: 650.0000 mg | Freq: Four times a day (QID) | RECTAL | Status: DC | PRN

## 2023-10-11 MED ORDER — PROMETHAZINE HCL 25 MG PO TABS
12.5000 mg | ORAL_TABLET | Freq: Four times a day (QID) | ORAL | Status: DC | PRN
  Administered 2023-10-11 – 2023-10-17 (×2): 12.5 mg via ORAL
  Filled 2023-10-11 (×2): qty 1

## 2023-10-11 MED ORDER — ALBUTEROL SULFATE (2.5 MG/3ML) 0.083% IN NEBU: 2.5000 mg | INHALATION_SOLUTION | RESPIRATORY_TRACT | Status: DC | PRN

## 2023-10-11 MED ORDER — DOCUSATE SODIUM 100 MG PO CAPS
100.0000 mg | ORAL_CAPSULE | Freq: Two times a day (BID) | ORAL | Status: DC
  Administered 2023-10-11 – 2023-10-18 (×13): 100 mg via ORAL
  Filled 2023-10-11 (×14): qty 1

## 2023-10-11 MED ORDER — POTASSIUM CHLORIDE CRYS ER 20 MEQ PO TBCR: 40.0000 meq | EXTENDED_RELEASE_TABLET | Freq: Once | ORAL | Status: DC

## 2023-10-11 NOTE — Progress Notes (Signed)
Received consult from ED provider.  I have reviewed the pertinent radiographic studies.  Impression is right scapular body fracture.  Nonoperative treatment.  Shoulder sling for comfort.  May weight-bear and perform range of motion of the shoulder as tolerated.  Follow-up in the office in about 3 weeks.

## 2023-10-11 NOTE — ED Notes (Signed)
ED TO INPATIENT HANDOFF REPORT  ED Nurse Name and Phone #: Huntley Dec [phone]  S Name/Age/Gender Bailey [name] 82 y.o. female Room/Bed: WTR6/WTR6  Code Status   Code Status: Limited: Do not attempt resuscitation (DNR) -DNR-LIMITED -Do Not Intubate/DNI   Home/SNF/Other Patient oriented to: self Is this baseline? Yes   Triage Complete: Triage complete  Chief Complaint Fall with injury [W19.XXXA]  Triage Note Patient BIB [facility] from home. Has dementia. Larey Seat this morning getting out the shower. Does not remember hitting her head. Did not black out. Not on a blood thinner. Has right arm pain and back pain.    Allergies Allergies  Allergen Reactions   Alendronate Sodium     Other reaction(s): hips/legs hurt   Amlodipine     Other reaction(s): feet swelling and hot flashes.   Aspirin     Other reaction(s): sceral hemorrhages   Cozaar [Losartan]     Other reaction(s): face broke out   Exelon [Rivastigmine]     Other reaction(s): just felt bad on it.   Fexofenadine     Other reaction(s): "made her aware of heart beating"   Hydrochlorothiazide     Other reaction(s): felt contributing to hair falling out 7/12.   Ibuprofen Nausea And Vomiting    vertigo   Ibuprofen     vomiting  Other Reaction(s): Dizziness, GI Intolerance  vertigo, vomiting   Lisinopril     Other reaction(s): hair was falling out on it.   Loratadine     Other reaction(s): Unknown   Metronidazole Diarrhea and Nausea And Vomiting    Other Reaction(s): Diarrhea, GI Intolerance   Olmesartan     Other reaction(s): i wonder if it contributed to her enteropathy in 2014. so will never give this again.   Ondansetron Nausea And Vomiting    Other Reaction(s): GI Intolerance   Pantoprazole Sodium     Other reaction(s): bloating   Simvastatin     Other reaction(s): stomach upset, vomiting    Level of Care/Admitting Diagnosis ED Disposition     ED Disposition  Admit   Condition  --   Comment  Hospital  Area: [facility] [[id number]]  Level of Care: Med-Surg [16]  May place patient in observation at [facility] or [facility] if equivalent level of care is available:: Yes  Covid Evaluation: Asymptomatic - no recent exposure (last 10 days) testing not required  Diagnosis: Fall with injury [[id number]]  Admitting Physician: Maryln Gottron [[id number]]  Attending Physician: Kirby Crigler, MIR [email or web address] [[id number]]          B Medical/Surgery History Past Medical History:  Diagnosis Date   Anxiety    Aortic insufficiency    Arthritis    "hands" (01/06/2013)   Basal cell carcinoma of skin    Bicuspid aortic valve    Bicuspid aortic valve    Cataract    right   Colitis, collagenous summer of 2013   Colon polyp    DDD (degenerative disc disease) 12/2009   Dementia (HCC)    GERD (gastroesophageal reflux disease)    "/endoscopy; doesn't bother me at all" (01/06/2013)   Hard of hearing    bilat   Heart murmur    Hx of migraines    Hyperlipidemia    Hypertension    Left facial numbness    previous, not current   Migraine    "used to have once/month; none in years" (01/06/2013)   Palpitations    Pneumonia ~ 1944; ~ 2009  Tinnitus    Vertigo    Past Surgical History:  Procedure Laterality Date   CATARACT EXTRACTION EXTRACAPSULAR Bilateral 2015   COLONOSCOPY N/A 01/07/2013   Procedure: COLONOSCOPY;  Surgeon: Florencia Reasons, MD;  Location: [facility] ENDOSCOPY;  Service: Endoscopy;  Laterality: N/A;  The colonoscopy will be unprepped   DILATION AND CURETTAGE OF UTERUS  2002   w/hysteroscopy   ESOPHAGOGASTRODUODENOSCOPY N/A 01/07/2013   Procedure: ESOPHAGOGASTRODUODENOSCOPY (EGD);  Surgeon: Florencia Reasons, MD;  Location: [facility] ENDOSCOPY;  Service: Endoscopy;  Laterality: N/A;   HYSTEROSCOPY  2002   D&C(sub fibroid)    HYSTEROSCOPY  2006   resection polyps D&C   THYROIDECTOMY, PARTIAL     "removed gland from left side of neck that was to go down and past thyroid in front" (01/06/2013)    TONSILLECTOMY     "when I was little" (01/06/2013)   TUBAL LIGATION Bilateral 1970's   TUMOR REMOVAL  1990's & 06/23/2012, 02/2014   "benign; roof of mouth" (01/06/2013)     A IV Location/Drains/Wounds Patient Lines/Drains/Airways Status     Active Line/Drains/Airways     Name Placement date Placement time Site Days   Peripheral IV 10/11/23 20 G Anterior;Right Forearm 10/11/23  1654  Forearm  less than 1            Intake/Output Last 24 hours No intake or output data in the 24 hours ending 10/11/23 1658  Labs/Imaging Results for orders placed or performed during the hospital encounter of 10/11/23 (from the past 48 hours)  CBC with Differential     Status: Abnormal   Collection Time: 10/11/23 12:45 PM  Result Value Ref Range   WBC 11.3 (H) 4.0 - 10.5 K/uL   RBC 3.76 (L) 3.87 - 5.11 MIL/uL   Hemoglobin 11.7 (L) 12.0 - 15.0 g/dL   HCT 16.1 (L) 09.6 - 04.5 %   MCV 93.1 80.0 - 100.0 fL   MCH 31.1 26.0 - 34.0 pg   MCHC 33.4 30.0 - 36.0 g/dL   RDW 40.9 81.1 - 91.4 %   Platelets 145 (L) 150 - 400 K/uL   nRBC 0.0 0.0 - 0.2 %   Neutrophils Relative % 84 %   Neutro Abs 9.4 (H) 1.7 - 7.7 K/uL   Lymphocytes Relative 9 %   Lymphs Abs 1.1 0.7 - 4.0 K/uL   Monocytes Relative 6 %   Monocytes Absolute 0.7 0.1 - 1.0 K/uL   Eosinophils Relative 0 %   Eosinophils Absolute 0.0 0.0 - 0.5 K/uL   Basophils Relative 0 %   Basophils Absolute 0.0 0.0 - 0.1 K/uL   Immature Granulocytes 1 %   Abs Immature Granulocytes 0.09 (H) 0.00 - 0.07 K/uL    Comment: Performed at [facility], [address] [place], [place] [address]  Basic metabolic panel     Status: Abnormal   Collection Time: 10/11/23 12:45 PM  Result Value Ref Range   Sodium 139 135 - 145 mmol/L   Potassium 3.2 (L) 3.5 - 5.1 mmol/L   Chloride 107 98 - 111 mmol/L   CO2 23 22 - 32 mmol/L   Glucose, Bld 105 (H) 70 - 99 mg/dL    Comment: Glucose reference range applies only to samples taken after fasting for at  least 8 hours.   BUN 23 8 - 23 mg/dL   Creatinine, Ser 6.21 0.44 - 1.00 mg/dL   Calcium 8.4 (L) 8.9 - 10.3 mg/dL   GFR, Estimated >30 >86 mL/min  Comment: (NOTE) Calculated using the CKD-EPI Creatinine Equation (2021)    Anion gap 9 5 - 15    Comment: Performed at [facility], [address], [place], [place] [address]   DG Lumbar Spine 2-3 Views Result Date: 10/11/2023 CLINICAL DATA:  Low back pain after fall. EXAM: LUMBAR SPINE - 2-3 VIEW COMPARISON:  None Available. FINDINGS: Mild grade 1 anterolisthesis of L3-4 is noted most likely due to posterior facet joint hypertrophy. Severe degenerative disc disease is noted at L3-4. No fracture is noted. IMPRESSION: Severe degenerative disc disease of L3-4. No acute abnormality seen. Aortic Atherosclerosis (ICD10-I70.0). Electronically Signed   By: Lupita Raider M.D.   On: 10/11/2023 15:51   DG Thoracic Spine 2 View Result Date: 10/11/2023 CLINICAL DATA:  Upper back pain after fall. EXAM: THORACIC SPINE 2 VIEWS COMPARISON:  None Available. FINDINGS: Mild compression deformity of T3 vertebral body is noted concerning for fracture of indeterminate age. No spondylolisthesis is noted. Disc spaces are well-maintained. IMPRESSION: Mild compression deformity of T3 vertebral body concerning for fracture of indeterminate age. MRI may be performed for further evaluation. Electronically Signed   By: Lupita Raider M.D.   On: 10/11/2023 15:49   DG Shoulder Right Result Date: 10/11/2023 CLINICAL DATA:  Fall with right shoulder pain EXAM: RIGHT SHOULDER - 2+ VIEW COMPARISON:  None Available. FINDINGS: AP and scapular views. There are displaced fractures of the mid and inferior scapular body. No extension into the glenoid. The humeral head is intact. IMPRESSION: Displaced scapular body fractures. Consider further evaluation with CT. Electronically Signed   By: Jeronimo Greaves M.D.   On: 10/11/2023 15:21   DG Chest 2 View Result Date:  10/11/2023 CLINICAL DATA:  Fall with back and right shoulder pain EXAM: CHEST - 2 VIEW COMPARISON:  None Available. FINDINGS: AP and lateral views. The lateral view is moderately degraded by patient arm position, not raised above the head. Midline trachea. Normal heart size. Atherosclerosis in the transverse aorta. No pleural effusion or pneumothorax. Nonspecific mild interstitial thickening for age. Biapical pleural thickening. IMPRESSION: No acute cardiopulmonary disease. Aortic Atherosclerosis (ICD10-I70.0). Electronically Signed   By: Jeronimo Greaves M.D.   On: 10/11/2023 15:18   CT Head Wo Contrast Result Date: 10/11/2023 CLINICAL DATA:  Fall, arm and back pain, no loss consciousness EXAM: CT HEAD WITHOUT CONTRAST CT CERVICAL SPINE WITHOUT CONTRAST TECHNIQUE: Multidetector CT imaging of the head and cervical spine was performed following the standard protocol without intravenous contrast. Multiplanar CT image reconstructions of the cervical spine were also generated. RADIATION DOSE REDUCTION: This exam was performed according to the departmental dose-optimization program which includes automated exposure control, adjustment of the mA and/or kV according to patient size and/or use of iterative reconstruction technique. COMPARISON:  06/13/2021 CT head, no prior CT cervical spine available, correlation is made with 12/04/2016 MRI cervical spine FINDINGS: CT HEAD FINDINGS Brain: No evidence of acute infarct, hemorrhage, mass, mass effect, or midline shift. No hydrocephalus or extra-axial fluid collection. Age related cerebral volume loss. Vascular: No hyperdense vessel. Skull: Negative for fracture or focal lesion. Sinuses/Orbits: No acute finding. Other: The mastoid air cells are well aerated. CT CERVICAL SPINE FINDINGS Alignment: No traumatic listhesis. Reversal of the normal cervical lordosis with anterolisthesis of C4 on C5 and C7 on T1 and retrolisthesis of C5 on C6, which appears facet mediated. Skull base  and vertebrae: No acute fracture or suspicious osseous lesion. Soft tissues and spinal canal: No prevertebral fluid or swelling. No  visible canal hematoma. Disc levels: Degenerative changes in the cervical spine.Moderate spinal canal stenosis at C5-C6. Upper chest: No focal pulmonary opacity or pleural effusion. IMPRESSION: 1. No acute intracranial process. 2. No acute fracture or traumatic listhesis in the cervical spine. Electronically Signed   By: Wiliam Ke M.D.   On: 10/11/2023 13:34   CT Cervical Spine Wo Contrast Result Date: 10/11/2023 CLINICAL DATA:  Fall, arm and back pain, no loss consciousness EXAM: CT HEAD WITHOUT CONTRAST CT CERVICAL SPINE WITHOUT CONTRAST TECHNIQUE: Multidetector CT imaging of the head and cervical spine was performed following the standard protocol without intravenous contrast. Multiplanar CT image reconstructions of the cervical spine were also generated. RADIATION DOSE REDUCTION: This exam was performed according to the departmental dose-optimization program which includes automated exposure control, adjustment of the mA and/or kV according to patient size and/or use of iterative reconstruction technique. COMPARISON:  06/13/2021 CT head, no prior CT cervical spine available, correlation is made with 12/04/2016 MRI cervical spine FINDINGS: CT HEAD FINDINGS Brain: No evidence of acute infarct, hemorrhage, mass, mass effect, or midline shift. No hydrocephalus or extra-axial fluid collection. Age related cerebral volume loss. Vascular: No hyperdense vessel. Skull: Negative for fracture or focal lesion. Sinuses/Orbits: No acute finding. Other: The mastoid air cells are well aerated. CT CERVICAL SPINE FINDINGS Alignment: No traumatic listhesis. Reversal of the normal cervical lordosis with anterolisthesis of C4 on C5 and C7 on T1 and retrolisthesis of C5 on C6, which appears facet mediated. Skull base and vertebrae: No acute fracture or suspicious osseous lesion. Soft tissues and  spinal canal: No prevertebral fluid or swelling. No visible canal hematoma. Disc levels: Degenerative changes in the cervical spine.Moderate spinal canal stenosis at C5-C6. Upper chest: No focal pulmonary opacity or pleural effusion. IMPRESSION: 1. No acute intracranial process. 2. No acute fracture or traumatic listhesis in the cervical spine. Electronically Signed   By: Wiliam Ke M.D.   On: 10/11/2023 13:34    Pending Labs Unresulted Labs (From admission, onward)     Start     Ordered   10/12/23 0500  Basic metabolic panel  Tomorrow morning,   R        10/11/23 1627   10/12/23 0500  CBC  Tomorrow morning,   R        10/11/23 1627            Vitals/Pain Today's Vitals   10/11/23 1245 10/11/23 1400 10/11/23 1500 10/11/23 1657  BP: (!) 199/74 (!) 190/78 (!) 182/76   Pulse: (!) 59 66 62   Resp: 18 (!) 25 (!) 21   Temp:    97.8 F (36.6 C)  TempSrc:    Oral  SpO2: 98% 99% 99%   Weight:      Height:      PainSc:        Isolation Precautions No active isolations  Medications Medications  furosemide (LASIX) tablet 20 mg (has no administration in time range)  metoprolol succinate (TOPROL-XL) 24 hr tablet 50 mg (has no administration in time range)  enoxaparin (LOVENOX) injection 40 mg (has no administration in time range)  acetaminophen (TYLENOL) tablet 650 mg (has no administration in time range)    Or  acetaminophen (TYLENOL) suppository 650 mg (has no administration in time range)  oxyCODONE (Oxy IR/ROXICODONE) immediate release tablet 5 mg (has no administration in time range)  fentaNYL (SUBLIMAZE) injection 12.5-50 mcg (has no administration in time range)  docusate sodium (COLACE) capsule 100 mg (has no administration  in time range)  polyethylene glycol (MIRALAX / GLYCOLAX) packet 17 g (has no administration in time range)  promethazine (PHENERGAN) tablet 12.5 mg (has no administration in time range)  albuterol (PROVENTIL) (2.5 MG/3ML) 0.083% nebulizer solution 2.5  mg (has no administration in time range)  hydrALAZINE (APRESOLINE) injection 5 mg (5 mg Intravenous Given 10/11/23 1654)  bacitracin ointment ( Topical Given 10/11/23 1247)  hydrALAZINE (APRESOLINE) tablet 10 mg (10 mg Oral Given 10/11/23 1322)  potassium chloride SA (KLOR-CON M) CR tablet 40 mEq (40 mEq Oral Given 10/11/23 1512)  morphine (PF) 2 MG/ML injection 2 mg (2 mg Intravenous Given 10/11/23 1654)    Mobility walks     Focused Assessments   R Recommendations: See Admitting Provider Note  Report given to:   Additional Notes:

## 2023-10-11 NOTE — ED Provider Notes (Signed)
[place] [facility] AT [facility] Provider Note   CSN: [id number] Arrival date & time: 10/11/23  1048     History  Chief Complaint  Patient presents with  . Fall    Bailey [name] is a 82 y.o. female.   Fall    Patient has a history of dementia, acute renal failure aortic insufficiency, dizziness, diarrhea, hyperlipidemia, hypertension, colitis who presents ED for evaluation after a fall.  Patient presented from home by ambulance for evaluation after a fall.  Per the EMS report patient was getting out of the shower this morning when she fell.  There was no loss of consciousness.  Patient has been complaining of pain in her arm and back since the fall.  Patient does not recall the fall this morning.  He does complain of pain in her right shoulder as well as her upper back.  She is not having any fevers or chills.  No chest pain or shortness of breath.  No abdominal pain.  She denies any weakness  Home Medications Prior to Admission medications   Medication Sig Start Date End Date Taking? Authorizing Provider  amoxicillin (AMOXIL) 500 MG capsule Take 1 capsule by mouth as needed (AS NEEDED FOR DENTAL PROCEDURES).  Patient not taking: Reported on 06/13/2021 03/16/15   [provider]  budesonide (ENTOCORT EC) 3 MG 24 hr capsule Take 9 mg by mouth daily.  09/24/18   [provider]  chlorthalidone (HYGROTON) 25 MG tablet Take 1 tablet (25 mg total) by mouth daily. 06/13/21 07/13/21  Koleen Distance, MD  COVID-19 mRNA vaccine, Moderna, 100 MCG/0.5ML injection Inject into the muscle. 05/03/21   Judyann Munson, MD  escitalopram (LEXAPRO) 10 MG tablet Take 10 mg by mouth daily.  11/25/18   [provider]  furosemide (LASIX) 20 MG tablet Take 20 mg by mouth daily. 04/02/21   [provider]  losartan (COZAAR) 100 MG tablet Take 100 mg by mouth daily. 05/09/21   [provider]  losartan (COZAAR) 50 MG tablet Take 1 tablet by  mouth daily. 06/25/19   [provider]  metoprolol succinate (TOPROL XL) 25 MG 24 hr tablet Take 1 tablet (25 mg total) by mouth daily. 07/08/19   Nahser, Deloris Ping, MD  metoprolol succinate (TOPROL-XL) 50 MG 24 hr tablet Take 50 mg by mouth daily. 06/01/21   [provider]  Multiple Vitamin (MULITIVITAMIN WITH MINERALS) TABS Take 1 tablet by mouth daily.    [provider]  pantoprazole (PROTONIX) 40 MG tablet Take 40 mg by mouth daily. 06/01/21   [provider]  potassium chloride (KLOR-CON) 10 MEQ tablet Take 10 mEq by mouth daily. 10/01/19   [provider]  rivastigmine (EXELON) 9.5 mg/24hr  09/23/18   [provider]      Allergies    Advil [ibuprofen], Alendronate sodium, Amlodipine, Aspirin, Cozaar [losartan], Exelon [rivastigmine], Fexofenadine, Hydrochlorothiazide, Ibuprofen, Lisinopril, Loratadine, Metronidazole, Olmesartan, Ondansetron, Pantoprazole sodium, and Simvastatin    Review of Systems   Review of Systems  Physical Exam Updated Vital Signs BP (!) 182/76   Pulse 62   Temp 97.6 F (36.4 C) (Oral)   Resp (!) 21   Ht 1.588 m (5' 2.5")   Wt 48 kg   LMP 06/23/2010 (Approximate)   SpO2 99%   BMI 19.05 kg/m  Physical Exam Vitals and nursing note reviewed.  Constitutional:      Appearance: She is well-developed. She is not diaphoretic.  HENT:  Head: Normocephalic and atraumatic.     Right Ear: External ear normal.     Left Ear: External ear normal.  Eyes:     General: No scleral icterus.       Right eye: No discharge.        Left eye: No discharge.     Conjunctiva/sclera: Conjunctivae normal.  Neck:     Trachea: No tracheal deviation.  Cardiovascular:     Rate and Rhythm: Normal rate and regular rhythm.  Pulmonary:     Effort: Pulmonary effort is normal. No respiratory distress.     Breath sounds: Normal breath sounds. No stridor. No wheezing or rales.  Abdominal:     General: Bowel sounds are normal.  There is no distension.     Palpations: Abdomen is soft.     Tenderness: There is no abdominal tenderness. There is no guarding or rebound.  Musculoskeletal:        General: No deformity.     Right shoulder: Tenderness and bony tenderness present.     Cervical back: Neck supple. Tenderness present.     Thoracic back: Tenderness present.     Lumbar back: No tenderness.     Comments: No ttp bilateral lower extremities or left upper extremity, no ttp left elbow or wrist  Skin:    General: Skin is warm and dry.     Findings: No rash.     Comments: Superficial skin abrasions tears, left  forearm  Neurological:     General: No focal deficit present.     Mental Status: She is alert.     Cranial Nerves: No cranial nerve deficit, dysarthria or facial asymmetry.     Sensory: No sensory deficit.     Motor: No abnormal muscle tone or seizure activity.     Coordination: Coordination normal.  Psychiatric:        Mood and Affect: Mood normal.    ED Results / Procedures / Treatments   Labs (all labs ordered are listed, but only abnormal results are displayed) Labs Reviewed  CBC WITH DIFFERENTIAL/PLATELET - Abnormal; Notable for the following components:      Result Value   WBC 11.3 (*)    RBC 3.76 (*)    Hemoglobin 11.7 (*)    HCT 35.0 (*)    Platelets 145 (*)    Neutro Abs 9.4 (*)    Abs Immature Granulocytes 0.09 (*)    All other components within normal limits  BASIC METABOLIC PANEL - Abnormal; Notable for the following components:   Potassium 3.2 (*)    Glucose, Bld 105 (*)    Calcium 8.4 (*)    All other components within normal limits    EKG EKG Interpretation Date/Time:  Thursday October 11 2023 12:55:10 EST Ventricular Rate:  62 PR Interval:  154 QRS Duration:  96 QT Interval:  478 QTC Calculation: 486 R Axis:   -61  Text Interpretation: Sinus rhythm Probable left atrial enlargement Left anterior fascicular block Abnormal R-wave progression, early transition Left  ventricular hypertrophy Anterior Q waves, possibly due to LVH No significant change since last tracing Confirmed by Linwood Dibbles [phone]) on 10/11/2023 3:20:44 PM  Radiology DG Lumbar Spine 2-3 Views Result Date: 10/11/2023 CLINICAL DATA:  Low back pain after fall. EXAM: LUMBAR SPINE - 2-3 VIEW COMPARISON:  None Available. FINDINGS: Mild grade 1 anterolisthesis of L3-4 is noted most likely due to posterior facet joint hypertrophy. Severe degenerative disc disease is noted at L3-4. No fracture is noted.  IMPRESSION: Severe degenerative disc disease of L3-4. No acute abnormality seen. Aortic Atherosclerosis (ICD10-I70.0). Electronically Signed   By: Lupita Raider M.D.   On: 10/11/2023 15:51   DG Thoracic Spine 2 View Result Date: 10/11/2023 CLINICAL DATA:  Upper back pain after fall. EXAM: THORACIC SPINE 2 VIEWS COMPARISON:  None Available. FINDINGS: Mild compression deformity of T3 vertebral body is noted concerning for fracture of indeterminate age. No spondylolisthesis is noted. Disc spaces are well-maintained. IMPRESSION: Mild compression deformity of T3 vertebral body concerning for fracture of indeterminate age. MRI may be performed for further evaluation. Electronically Signed   By: Lupita Raider M.D.   On: 10/11/2023 15:49   DG Shoulder Right Result Date: 10/11/2023 CLINICAL DATA:  Fall with right shoulder pain EXAM: RIGHT SHOULDER - 2+ VIEW COMPARISON:  None Available. FINDINGS: AP and scapular views. There are displaced fractures of the mid and inferior scapular body. No extension into the glenoid. The humeral head is intact. IMPRESSION: Displaced scapular body fractures. Consider further evaluation with CT. Electronically Signed   By: Jeronimo Greaves M.D.   On: 10/11/2023 15:21   DG Chest 2 View Result Date: 10/11/2023 CLINICAL DATA:  Fall with back and right shoulder pain EXAM: CHEST - 2 VIEW COMPARISON:  None Available. FINDINGS: AP and lateral views. The lateral view is moderately degraded  by patient arm position, not raised above the head. Midline trachea. Normal heart size. Atherosclerosis in the transverse aorta. No pleural effusion or pneumothorax. Nonspecific mild interstitial thickening for age. Biapical pleural thickening. IMPRESSION: No acute cardiopulmonary disease. Aortic Atherosclerosis (ICD10-I70.0). Electronically Signed   By: Jeronimo Greaves M.D.   On: 10/11/2023 15:18   CT Head Wo Contrast Result Date: 10/11/2023 CLINICAL DATA:  Fall, arm and back pain, no loss consciousness EXAM: CT HEAD WITHOUT CONTRAST CT CERVICAL SPINE WITHOUT CONTRAST TECHNIQUE: Multidetector CT imaging of the head and cervical spine was performed following the standard protocol without intravenous contrast. Multiplanar CT image reconstructions of the cervical spine were also generated. RADIATION DOSE REDUCTION: This exam was performed according to the departmental dose-optimization program which includes automated exposure control, adjustment of the mA and/or kV according to patient size and/or use of iterative reconstruction technique. COMPARISON:  06/13/2021 CT head, no prior CT cervical spine available, correlation is made with 12/04/2016 MRI cervical spine FINDINGS: CT HEAD FINDINGS Brain: No evidence of acute infarct, hemorrhage, mass, mass effect, or midline shift. No hydrocephalus or extra-axial fluid collection. Age related cerebral volume loss. Vascular: No hyperdense vessel. Skull: Negative for fracture or focal lesion. Sinuses/Orbits: No acute finding. Other: The mastoid air cells are well aerated. CT CERVICAL SPINE FINDINGS Alignment: No traumatic listhesis. Reversal of the normal cervical lordosis with anterolisthesis of C4 on C5 and C7 on T1 and retrolisthesis of C5 on C6, which appears facet mediated. Skull base and vertebrae: No acute fracture or suspicious osseous lesion. Soft tissues and spinal canal: No prevertebral fluid or swelling. No visible canal hematoma. Disc levels: Degenerative changes  in the cervical spine.Moderate spinal canal stenosis at C5-C6. Upper chest: No focal pulmonary opacity or pleural effusion. IMPRESSION: 1. No acute intracranial process. 2. No acute fracture or traumatic listhesis in the cervical spine. Electronically Signed   By: Wiliam Ke M.D.   On: 10/11/2023 13:34   CT Cervical Spine Wo Contrast Result Date: 10/11/2023 CLINICAL DATA:  Fall, arm and back pain, no loss consciousness EXAM: CT HEAD WITHOUT CONTRAST CT CERVICAL SPINE WITHOUT CONTRAST TECHNIQUE: Multidetector CT imaging of  the head and cervical spine was performed following the standard protocol without intravenous contrast. Multiplanar CT image reconstructions of the cervical spine were also generated. RADIATION DOSE REDUCTION: This exam was performed according to the departmental dose-optimization program which includes automated exposure control, adjustment of the mA and/or kV according to patient size and/or use of iterative reconstruction technique. COMPARISON:  06/13/2021 CT head, no prior CT cervical spine available, correlation is made with 12/04/2016 MRI cervical spine FINDINGS: CT HEAD FINDINGS Brain: No evidence of acute infarct, hemorrhage, mass, mass effect, or midline shift. No hydrocephalus or extra-axial fluid collection. Age related cerebral volume loss. Vascular: No hyperdense vessel. Skull: Negative for fracture or focal lesion. Sinuses/Orbits: No acute finding. Other: The mastoid air cells are well aerated. CT CERVICAL SPINE FINDINGS Alignment: No traumatic listhesis. Reversal of the normal cervical lordosis with anterolisthesis of C4 on C5 and C7 on T1 and retrolisthesis of C5 on C6, which appears facet mediated. Skull base and vertebrae: No acute fracture or suspicious osseous lesion. Soft tissues and spinal canal: No prevertebral fluid or swelling. No visible canal hematoma. Disc levels: Degenerative changes in the cervical spine.Moderate spinal canal stenosis at C5-C6. Upper chest: No  focal pulmonary opacity or pleural effusion. IMPRESSION: 1. No acute intracranial process. 2. No acute fracture or traumatic listhesis in the cervical spine. Electronically Signed   By: Wiliam Ke M.D.   On: 10/11/2023 13:34    Procedures Procedures    Medications Ordered in ED Medications  morphine (PF) 2 MG/ML injection 2 mg (has no administration in time range)  potassium chloride SA (KLOR-CON M) CR tablet 40 mEq (has no administration in time range)  furosemide (LASIX) tablet 20 mg (has no administration in time range)  metoprolol succinate (TOPROL-XL) 24 hr tablet 50 mg (has no administration in time range)  enoxaparin (LOVENOX) injection 40 mg (has no administration in time range)  acetaminophen (TYLENOL) tablet 650 mg (has no administration in time range)    Or  acetaminophen (TYLENOL) suppository 650 mg (has no administration in time range)  oxyCODONE (Oxy IR/ROXICODONE) immediate release tablet 5 mg (has no administration in time range)  fentaNYL (SUBLIMAZE) injection 12.5-50 mcg (has no administration in time range)  docusate sodium (COLACE) capsule 100 mg (has no administration in time range)  polyethylene glycol (MIRALAX / GLYCOLAX) packet 17 g (has no administration in time range)  promethazine (PHENERGAN) tablet 12.5 mg (has no administration in time range)  albuterol (PROVENTIL) (2.5 MG/3ML) 0.083% nebulizer solution 2.5 mg (has no administration in time range)  hydrALAZINE (APRESOLINE) injection 5 mg (has no administration in time range)  bacitracin ointment ( Topical Given 10/11/23 1247)  hydrALAZINE (APRESOLINE) tablet 10 mg (10 mg Oral Given 10/11/23 1322)  potassium chloride SA (KLOR-CON M) CR tablet 40 mEq (40 mEq Oral Given 10/11/23 1512)    ED Course/ Medical Decision Making/ A&P Clinical Course as of 10/11/23 1631  Thu Oct 11, 2023  1429 Head CT and C-spine CT without acute findings.  CBC shows stable anemia.  Metabolic panel shows mild hypokalemia [JK]   1557 Lumbar spine does not show any acute fracture.  Thoracic spine does show possible fracture at T3.  Recommendation is for MRI [JK]  1557 X-ray does show displaced scapular body fracture.  Recommendation is for CT scan  [JK]  1623 Case discussed with Dr Kirby Crigler [JK]  1625 Discussed with Dr Roda Shutters, orthopedics [JK]    Clinical Course User Index [JK] Linwood Dibbles, MD  Medical Decision Making Problems Addressed: Closed fracture of right scapula, unspecified part of scapula, initial encounter: acute illness or injury that poses a threat to life or bodily functions Compression fracture of T3 vertebra, initial encounter [facility]): acute illness or injury that poses a threat to life or bodily functions Fall, initial encounter: acute illness or injury Hypertension, unspecified type: chronic illness or injury with exacerbation, progression, or side effects of treatment  Amount and/or Complexity of Data Reviewed Labs: ordered. Decision-making details documented in ED Course. Radiology: ordered and independent interpretation performed.  Risk OTC drugs. Prescription drug management. Decision regarding hospitalization.   Patient presented to the ED for evaluation after a fall.  Patient has history of dementia but is normally able to care for herself.  She lives at home with family members.  Patient notably hypertensive in the ED.  She was given additional dose of blood pressure medications.  Doubt this acutely contributed to her fall  Patient's x-rays do show a scapular fracture as well as a T3 compression fracture.  I have ordered CT scan and MRI as recommended by radiology.  Discussed these findings with the patient and her daughters.  I do not think she would be able to manage at home with these injuries at this time.  I have ordered IV pain medications.  I will consult with orthopedics.  Plan on admission to hospital for further treatment.  Patient may end up  requiring short-term rehab  Case discussed with Dr Jaclynn Guarneri and Dr Roda Shutters        Final Clinical Impression(s) / ED Diagnoses Final diagnoses:  Hypertension, unspecified type  Fall, initial encounter  Compression fracture of T3 vertebra, initial encounter [facility])  Closed fracture of right scapula, unspecified part of scapula, initial encounter    Rx / DC Orders ED Discharge Orders     None         Linwood Dibbles, MD 10/11/23 [phone]

## 2023-10-11 NOTE — ED Triage Notes (Addendum)
Patient BIB [facility] from home. Has dementia. Larey Seat this morning getting out the shower. Does not remember hitting her head. Did not black out. Not on a blood thinner. Has right arm pain and back pain.

## 2023-10-11 NOTE — H&P (Signed)
History and Physical  Bailey [name] VHQ:[id number] DOB: 1941-08-31 DOA: 10/11/2023  PCP: Rodrigo Ran, MD   Chief Complaint: Fall at home  HPI: Bailey [name] is a 82 y.o. female with medical history significant for dementia, hypertension, hyperlipidemia being admitted to the hospital after unwitnessed fall resulting in displaced right scapular fracture as well as T3 compression fracture.  History is very limited, as the patient lives independently with her husband, and is checked on by her daughters every few days.  She has been in her usual state of health.  Today, daughter came to see them this morning, and patient was on the ground.  Unable to provide any corroborating history.  Brought to the ER for evaluation.  Workup here shows minimal leukocytosis, some mild hypokalemia.  Imaging as detailed below shows evidence of right scapular fracture, and T3 compression fracture of indeterminate age.  Review of Systems: Please see HPI for pertinent positives and negatives. A complete 10 system review of systems are otherwise negative.  Past Medical History:  Diagnosis Date   Anxiety    Aortic insufficiency    Arthritis    "hands" (01/06/2013)   Basal cell carcinoma of skin    Bicuspid aortic valve    Bicuspid aortic valve    Cataract    right   Colitis, collagenous summer of 2013   Colon polyp    DDD (degenerative disc disease) 12/2009   Dementia (HCC)    GERD (gastroesophageal reflux disease)    "/endoscopy; doesn't bother me at all" (01/06/2013)   Hard of hearing    bilat   Heart murmur    Hx of migraines    Hyperlipidemia    Hypertension    Left facial numbness    previous, not current   Migraine    "used to have once/month; none in years" (01/06/2013)   Palpitations    Pneumonia ~ 1944; ~ 2009   Tinnitus    Vertigo    Past Surgical History:  Procedure Laterality Date   CATARACT EXTRACTION EXTRACAPSULAR Bilateral 2015   COLONOSCOPY N/A 01/07/2013   Procedure:  COLONOSCOPY;  Surgeon: Florencia Reasons, MD;  Location: [facility] ENDOSCOPY;  Service: Endoscopy;  Laterality: N/A;  The colonoscopy will be unprepped   DILATION AND CURETTAGE OF UTERUS  2002   w/hysteroscopy   ESOPHAGOGASTRODUODENOSCOPY N/A 01/07/2013   Procedure: ESOPHAGOGASTRODUODENOSCOPY (EGD);  Surgeon: Florencia Reasons, MD;  Location: [facility] ENDOSCOPY;  Service: Endoscopy;  Laterality: N/A;   HYSTEROSCOPY  2002   D&C(sub fibroid)    HYSTEROSCOPY  2006   resection polyps D&C   THYROIDECTOMY, PARTIAL     "removed gland from left side of neck that was to go down and past thyroid in front" (01/06/2013)   TONSILLECTOMY     "when I was little" (01/06/2013)   TUBAL LIGATION Bilateral 1970's   TUMOR REMOVAL  1990's & 06/23/2012, 02/2014   "benign; roof of mouth" (01/06/2013)    Social History:  reports that she has never smoked. She has never used smokeless tobacco. She reports that she does not drink alcohol and does not use drugs.   Allergies  Allergen Reactions   Advil [Ibuprofen]     vomiting   Alendronate Sodium     Other reaction(s): hips/legs hurt   Amlodipine     Other reaction(s): feet swelling and hot flashes.   Aspirin     Other reaction(s): sceral hemorrhages   Cozaar [Losartan]     Other reaction(s): face broke out  Exelon [Rivastigmine]     Other reaction(s): just felt bad on it.   Fexofenadine     Other reaction(s): "made her aware of heart beating"   Hydrochlorothiazide     Other reaction(s): felt contributing to hair falling out 7/12.   Ibuprofen Nausea And Vomiting    vertigo   Lisinopril     Other reaction(s): hair was falling out on it.   Loratadine     Other reaction(s): Unknown   Metronidazole Diarrhea and Nausea And Vomiting   Olmesartan     Other reaction(s): i wonder if it contributed to her enteropathy in 2014. so will never give this again.   Ondansetron Nausea And Vomiting   Pantoprazole Sodium     Other reaction(s): bloating   Simvastatin     Other  reaction(s): stomach upset, vomiting    Family History  Problem Relation Age of Onset   Bone cancer Father    Hypertension Father    Thyroid disease Father        hypo   Brain cancer Brother    Arrhythmia Mother    Osteoporosis Mother    Cancer Mother        colon   Diabetes Mother    Hypertension Mother      Prior to Admission medications   Medication Sig Start Date End Date Taking? Authorizing Provider  amoxicillin (AMOXIL) 500 MG capsule Take 1 capsule by mouth as needed (AS NEEDED FOR DENTAL PROCEDURES).  Patient not taking: Reported on 06/13/2021 03/16/15   [provider]  budesonide (ENTOCORT EC) 3 MG 24 hr capsule Take 9 mg by mouth daily.  09/24/18   [provider]  chlorthalidone (HYGROTON) 25 MG tablet Take 1 tablet (25 mg total) by mouth daily. 06/13/21 07/13/21  Koleen Distance, MD  COVID-19 mRNA vaccine, Moderna, 100 MCG/0.5ML injection Inject into the muscle. 05/03/21   Judyann Munson, MD  escitalopram (LEXAPRO) 10 MG tablet Take 10 mg by mouth daily.  11/25/18   [provider]  furosemide (LASIX) 20 MG tablet Take 20 mg by mouth daily. 04/02/21   [provider]  losartan (COZAAR) 100 MG tablet Take 100 mg by mouth daily. 05/09/21   [provider]  losartan (COZAAR) 50 MG tablet Take 1 tablet by mouth daily. 06/25/19   [provider]  metoprolol succinate (TOPROL XL) 25 MG 24 hr tablet Take 1 tablet (25 mg total) by mouth daily. 07/08/19   Nahser, Deloris Ping, MD  metoprolol succinate (TOPROL-XL) 50 MG 24 hr tablet Take 50 mg by mouth daily. 06/01/21   [provider]  Multiple Vitamin (MULITIVITAMIN WITH MINERALS) TABS Take 1 tablet by mouth daily.    [provider]  pantoprazole (PROTONIX) 40 MG tablet Take 40 mg by mouth daily. 06/01/21   [provider]  potassium chloride (KLOR-CON) 10 MEQ tablet Take 10 mEq by mouth daily. 10/01/19   [provider]  rivastigmine (EXELON) 9.5 mg/24hr   09/23/18   [provider]    Physical Exam: BP (!) 182/76   Pulse 62   Temp 97.6 F (36.4 C) (Oral)   Resp (!) 21   Ht 5' 2.5" (1.588 m)   Wt 48 kg   LMP 06/23/2010 (Approximate)   SpO2 99%   BMI 19.05 kg/m   General:  Alert, oriented to self and place, calm, in no acute distress, her 2 daughters are at the bedside Cardiovascular: RRR, no murmurs or rubs, no peripheral edema  Respiratory:  clear to auscultation bilaterally, no wheezes, no crackles  Abdomen: soft, nontender, nondistended, normal bowel tones heard  Skin: dry, no rashes  Musculoskeletal: no joint effusions, pain in the right shoulder with any movement Psychiatric: appropriate affect, normal speech  Neurologic: extraocular muscles intact, clear speech, moving all extremities with intact sensorium         Labs on Admission:  Basic Metabolic Panel: Recent Labs  Lab 10/11/23 1245  NA 139  K 3.2*  CL 107  CO2 23  GLUCOSE 105*  BUN 23  CREATININE 0.83  CALCIUM 8.4*   Liver Function Tests: No results for input(s): "AST", "ALT", "ALKPHOS", "BILITOT", "PROT", "ALBUMIN" in the last 168 hours. No results for input(s): "LIPASE", "AMYLASE" in the last 168 hours. No results for input(s): "AMMONIA" in the last 168 hours. CBC: Recent Labs  Lab 10/11/23 1245  WBC 11.3*  NEUTROABS 9.4*  HGB 11.7*  HCT 35.0*  MCV 93.1  PLT 145*   Cardiac Enzymes: No results for input(s): "CKTOTAL", "CKMB", "CKMBINDEX", "TROPONINI" in the last 168 hours.  BNP (last 3 results) No results for input(s): "BNP" in the last 8760 hours.  ProBNP (last 3 results) No results for input(s): "PROBNP" in the last 8760 hours.  CBG: No results for input(s): "GLUCAP" in the last 168 hours.  Radiological Exams on Admission: DG Lumbar Spine 2-3 Views Result Date: 10/11/2023 CLINICAL DATA:  Low back pain after fall. EXAM: LUMBAR SPINE - 2-3 VIEW COMPARISON:  None Available. FINDINGS: Mild grade 1 anterolisthesis of L3-4 is noted  most likely due to posterior facet joint hypertrophy. Severe degenerative disc disease is noted at L3-4. No fracture is noted. IMPRESSION: Severe degenerative disc disease of L3-4. No acute abnormality seen. Aortic Atherosclerosis (ICD10-I70.0). Electronically Signed   By: Lupita Raider M.D.   On: 10/11/2023 15:51   DG Thoracic Spine 2 View Result Date: 10/11/2023 CLINICAL DATA:  Upper back pain after fall. EXAM: THORACIC SPINE 2 VIEWS COMPARISON:  None Available. FINDINGS: Mild compression deformity of T3 vertebral body is noted concerning for fracture of indeterminate age. No spondylolisthesis is noted. Disc spaces are well-maintained. IMPRESSION: Mild compression deformity of T3 vertebral body concerning for fracture of indeterminate age. MRI may be performed for further evaluation. Electronically Signed   By: Lupita Raider M.D.   On: 10/11/2023 15:49   DG Shoulder Right Result Date: 10/11/2023 CLINICAL DATA:  Fall with right shoulder pain EXAM: RIGHT SHOULDER - 2+ VIEW COMPARISON:  None Available. FINDINGS: AP and scapular views. There are displaced fractures of the mid and inferior scapular body. No extension into the glenoid. The humeral head is intact. IMPRESSION: Displaced scapular body fractures. Consider further evaluation with CT. Electronically Signed   By: Jeronimo Greaves M.D.   On: 10/11/2023 15:21   DG Chest 2 View Result Date: 10/11/2023 CLINICAL DATA:  Fall with back and right shoulder pain EXAM: CHEST - 2 VIEW COMPARISON:  None Available. FINDINGS: AP and lateral views. The lateral view is moderately degraded by patient arm position, not raised above the head. Midline trachea. Normal heart size. Atherosclerosis in the transverse aorta. No pleural effusion or pneumothorax. Nonspecific mild interstitial thickening for age. Biapical pleural thickening. IMPRESSION: No acute cardiopulmonary disease. Aortic Atherosclerosis (ICD10-I70.0). Electronically Signed   By: Jeronimo Greaves M.D.   On:  10/11/2023 15:18   CT Head Wo Contrast Result Date: 10/11/2023 CLINICAL DATA:  Fall, arm and back pain, no loss consciousness EXAM: CT HEAD WITHOUT CONTRAST CT CERVICAL SPINE WITHOUT  CONTRAST TECHNIQUE: Multidetector CT imaging of the head and cervical spine was performed following the standard protocol without intravenous contrast. Multiplanar CT image reconstructions of the cervical spine were also generated. RADIATION DOSE REDUCTION: This exam was performed according to the departmental dose-optimization program which includes automated exposure control, adjustment of the mA and/or kV according to patient size and/or use of iterative reconstruction technique. COMPARISON:  06/13/2021 CT head, no prior CT cervical spine available, correlation is made with 12/04/2016 MRI cervical spine FINDINGS: CT HEAD FINDINGS Brain: No evidence of acute infarct, hemorrhage, mass, mass effect, or midline shift. No hydrocephalus or extra-axial fluid collection. Age related cerebral volume loss. Vascular: No hyperdense vessel. Skull: Negative for fracture or focal lesion. Sinuses/Orbits: No acute finding. Other: The mastoid air cells are well aerated. CT CERVICAL SPINE FINDINGS Alignment: No traumatic listhesis. Reversal of the normal cervical lordosis with anterolisthesis of C4 on C5 and C7 on T1 and retrolisthesis of C5 on C6, which appears facet mediated. Skull base and vertebrae: No acute fracture or suspicious osseous lesion. Soft tissues and spinal canal: No prevertebral fluid or swelling. No visible canal hematoma. Disc levels: Degenerative changes in the cervical spine.Moderate spinal canal stenosis at C5-C6. Upper chest: No focal pulmonary opacity or pleural effusion. IMPRESSION: 1. No acute intracranial process. 2. No acute fracture or traumatic listhesis in the cervical spine. Electronically Signed   By: Wiliam Ke M.D.   On: 10/11/2023 13:34   CT Cervical Spine Wo Contrast Result Date: 10/11/2023 CLINICAL  DATA:  Fall, arm and back pain, no loss consciousness EXAM: CT HEAD WITHOUT CONTRAST CT CERVICAL SPINE WITHOUT CONTRAST TECHNIQUE: Multidetector CT imaging of the head and cervical spine was performed following the standard protocol without intravenous contrast. Multiplanar CT image reconstructions of the cervical spine were also generated. RADIATION DOSE REDUCTION: This exam was performed according to the departmental dose-optimization program which includes automated exposure control, adjustment of the mA and/or kV according to patient size and/or use of iterative reconstruction technique. COMPARISON:  06/13/2021 CT head, no prior CT cervical spine available, correlation is made with 12/04/2016 MRI cervical spine FINDINGS: CT HEAD FINDINGS Brain: No evidence of acute infarct, hemorrhage, mass, mass effect, or midline shift. No hydrocephalus or extra-axial fluid collection. Age related cerebral volume loss. Vascular: No hyperdense vessel. Skull: Negative for fracture or focal lesion. Sinuses/Orbits: No acute finding. Other: The mastoid air cells are well aerated. CT CERVICAL SPINE FINDINGS Alignment: No traumatic listhesis. Reversal of the normal cervical lordosis with anterolisthesis of C4 on C5 and C7 on T1 and retrolisthesis of C5 on C6, which appears facet mediated. Skull base and vertebrae: No acute fracture or suspicious osseous lesion. Soft tissues and spinal canal: No prevertebral fluid or swelling. No visible canal hematoma. Disc levels: Degenerative changes in the cervical spine.Moderate spinal canal stenosis at C5-C6. Upper chest: No focal pulmonary opacity or pleural effusion. IMPRESSION: 1. No acute intracranial process. 2. No acute fracture or traumatic listhesis in the cervical spine. Electronically Signed   By: Wiliam Ke M.D.   On: 10/11/2023 13:34    Assessment/Plan Bailey [name] is a 82 y.o. female with medical history significant for dementia, hypertension, hyperlipidemia being  admitted to the hospital after unwitnessed fall resulting in displaced right scapular fracture as well as T3 compression fracture.   Right scapular fracture-after unwitnessed fall at home, CT head and neck without acute or traumatic findings. -Observation admission -Pain control -Orthopedics consult Dr. Roda Shutters and CT of the scapula  T3 compression  fracture-indeterminate age, possibly due to fall earlier today, versus chronic.  Patient does complain of some mid low back pain with deep inspiration so may indicate this is more of an acute fracture. -MRI thoracic spine  Hypertension-blood pressure currently uncontrolled, likely due to missing home medications this morning, and uncontrolled pain -Toprol-XL -IV hydralazine for uncontrolled hypertension  Hypokalemia-replete orally, recheck in the morning  [place]ai ptosis-no signs or symptoms of active infection, suspect this is reactive from her traumatic fall and injury  DVT prophylaxis: Lovenox     Code Status: Limited: Do not attempt resuscitation (DNR) -DNR-LIMITED -Do Not Intubate/DNI   Consults called: EDP discussed with orthopedic surgery  Admission status: Observation  Time spent: 49 minutes  Daryl Beehler Sharlette Dense MD [facility] Pager [phone]  If 7PM-7AM, please contact night-coverage [email or web address] Password [facility]  10/11/2023, 4:27 PM

## 2023-10-11 NOTE — Progress Notes (Signed)
Orthopedic Tech Progress Note Patient Details:  Bailey [name] 1941-08-22 [id number]  Ortho Devices Type of Ortho Device: Shoulder immobilizer Ortho Device/Splint Location: right Ortho Device/Splint Interventions: Ordered, Application, Adjustment   Post Interventions Patient Tolerated: Well Instructions Provided: Adjustment of device, Care of device  Kizzie Fantasia 10/11/2023, 5:27 PM

## 2023-10-12 DIAGNOSIS — W19XXXA Unspecified fall, initial encounter: Secondary | ICD-10-CM | POA: Diagnosis not present

## 2023-10-12 DIAGNOSIS — S22030A Wedge compression fracture of third thoracic vertebra, initial encounter for closed fracture: Secondary | ICD-10-CM | POA: Diagnosis not present

## 2023-10-12 DIAGNOSIS — I10 Essential (primary) hypertension: Secondary | ICD-10-CM | POA: Diagnosis not present

## 2023-10-12 DIAGNOSIS — S42101A Fracture of unspecified part of scapula, right shoulder, initial encounter for closed fracture: Secondary | ICD-10-CM | POA: Diagnosis present

## 2023-10-12 DIAGNOSIS — S22000A Wedge compression fracture of unspecified thoracic vertebra, initial encounter for closed fracture: Secondary | ICD-10-CM | POA: Diagnosis present

## 2023-10-12 LAB — BASIC METABOLIC PANEL
Anion gap: 6 (ref 5–15)
BUN: 23 mg/dL (ref 8–23)
CO2: 25 mmol/L (ref 22–32)
Calcium: 7.7 mg/dL — ABNORMAL LOW (ref 8.9–10.3)
Chloride: 105 mmol/L (ref 98–111)
Creatinine, Ser: 0.81 mg/dL (ref 0.44–1.00)
GFR, Estimated: >60 mL/min (ref >60)
Glucose, Bld: 99 mg/dL (ref 70–99)
Potassium: 4 mmol/L (ref 3.5–5.1)
Sodium: 136 mmol/L (ref 135–145)

## 2023-10-12 LAB — CBC
HCT: 35 % — ABNORMAL LOW (ref 36.0–46.0)
Hemoglobin: 11 g/dL — ABNORMAL LOW (ref 12.0–15.0)
MCH: 29.9 pg (ref 26.0–34.0)
MCHC: 31.4 g/dL (ref 30.0–36.0)
MCV: 95.1 fL (ref 80.0–100.0)
Platelets: 172 10*3/uL (ref 150–400)
RBC: 3.68 MIL/uL — ABNORMAL LOW (ref 3.87–5.11)
RDW: 13.1 % (ref 11.5–15.5)
WBC: 8.1 10*3/uL (ref 4.0–10.5)
nRBC: 0 % (ref 0.0–0.2)

## 2023-10-12 MED ORDER — ESCITALOPRAM OXALATE 20 MG PO TABS
10.0000 mg | ORAL_TABLET | Freq: Every day | ORAL | Status: DC
  Administered 2023-10-12 – 2023-10-18 (×7): 10 mg via ORAL
  Filled 2023-10-12 (×7): qty 1

## 2023-10-12 MED ORDER — VITAMIN B-12 1000 MCG PO TABS
1000.0000 ug | ORAL_TABLET | Freq: Every day | ORAL | Status: DC
  Administered 2023-10-12 – 2023-10-18 (×7): 1000 ug via ORAL
  Filled 2023-10-12 (×7): qty 1

## 2023-10-12 MED ORDER — DONEPEZIL HCL 10 MG PO TABS
5.0000 mg | ORAL_TABLET | Freq: Every day | ORAL | Status: DC
  Administered 2023-10-12 – 2023-10-18 (×7): 5 mg via ORAL
  Filled 2023-10-12 (×7): qty 1

## 2023-10-12 MED ORDER — AMLODIPINE BESYLATE 5 MG PO TABS
2.5000 mg | ORAL_TABLET | Freq: Every day | ORAL | Status: DC
  Administered 2023-10-12 – 2023-10-13 (×2): 2.5 mg via ORAL
  Filled 2023-10-12 (×2): qty 1

## 2023-10-12 MED ORDER — PANTOPRAZOLE SODIUM 40 MG PO TBEC
40.0000 mg | DELAYED_RELEASE_TABLET | Freq: Every day | ORAL | Status: DC
  Administered 2023-10-13 – 2023-10-18 (×6): 40 mg via ORAL
  Filled 2023-10-12 (×6): qty 1

## 2023-10-12 MED ORDER — LIDOCAINE 5 % EX PTCH
1.0000 | MEDICATED_PATCH | CUTANEOUS | Status: DC
  Administered 2023-10-12 – 2023-10-18 (×7): 1 via TRANSDERMAL
  Filled 2023-10-12 (×7): qty 1

## 2023-10-12 MED ORDER — BUDESONIDE 3 MG PO CPEP
3.0000 mg | ORAL_CAPSULE | ORAL | Status: DC
Start: 2023-10-12 — End: 2023-10-12

## 2023-10-12 NOTE — Progress Notes (Signed)
Mobility Specialist - Progress Note   10/12/23 0914  Mobility  Activity Ambulated with assistance in hallway  Level of Assistance Contact guard assist, steadying assist  Assistive Device Front wheel walker  Distance Ambulated (ft) 230 ft  RUE Weight Bearing Per Provider Order WBAT  Activity Response Tolerated well  Mobility Referral Yes  Mobility visit 1 Mobility  Mobility Specialist Start Time (ACUTE ONLY) 0859  Mobility Specialist Stop Time (ACUTE ONLY) 0914  Mobility Specialist Time Calculation (min) (ACUTE ONLY) 15 min   Pt received in bed and agreeable to mobility. Pt was minA from supine>sitting. Required verbal cues for direction. C/o R arm pain throughout session. No other complaints during session. Pt to bed after session with all needs met. Bed alarm on & family in room.     [facility]

## 2023-10-12 NOTE — TOC Initial Note (Signed)
Transition of Care [facility]) - Initial/Assessment Note   Patient Details  Name: Bailey [name] MRN: [id number] Date of Birth: 1941/01/24  [facility] of Care [facility]) CM/SW Contact:    Ewing Schlein, LCSW Phone Number: 10/12/2023, 4:06 PM  Clinical Narrative: CSW spoke with daughter about possible placement/rehab. CSW explained PT will need to assess the patient before SNF can be pursued. TOC awaiting PT evaluation.                 Expected Discharge Plan:  (TBD) Barriers to Discharge: Continued Medical Work up  Patient Goals and CMS Choice Patient states their goals for this hospitalization and ongoing recovery are:: Go to rehab if possible  Expected Discharge Plan and Services Living arrangements for the past 2 months: Single Family Home  Prior Living Arrangements/Services Living arrangements for the past 2 months: Single Family Home Lives with:: Spouse Patient language and need for interpreter reviewed:: Yes Do you feel safe going back to the place where you live?: Yes      Need for Family Participation in Patient Care: Yes (Comment) (Patient has dementia.) Care giver support system in place?: Yes (comment) Criminal Activity/Legal Involvement Pertinent to Current Situation/Hospitalization: No - Comment as needed  Activities of Daily Living ADL Screening (condition at time of admission) Independently performs ADLs?: Yes (appropriate for developmental age) Is the patient deaf or have difficulty hearing?: No Does the patient have difficulty seeing, even when wearing glasses/contacts?: No Does the patient have difficulty concentrating, remembering, or making decisions?: Yes  Emotional Assessment Orientation: : Oriented to Self Alcohol / Substance Use: Not Applicable Psych Involvement: No (comment)  Admission diagnosis:  Fall with injury [W19.XXXA] Fall, initial encounter [id number]] Closed fracture of right scapula, unspecified part of scapula, initial encounter  [S42.101A] Hypertension, unspecified type [I10] Compression fracture of T3 vertebra, initial encounter (HCC) [S22.030A] Patient Active Problem List   Diagnosis Date Noted   Closed right scapular fracture 10/12/2023   Compression fracture of body of thoracic vertebra (HCC) 10/12/2023   Fall with injury 10/11/2023   Aortic insufficiency 09/06/2017   Peripheral neuropathy 08/15/2017   Tinnitus 05/18/2014   Pleomorphic adenoma of minor salivary gland 01/13/2014   Diarrhea 01/06/2013   Nausea with vomiting 01/06/2013   Abdominal pain, right upper quadrant 01/06/2013   Palpitations 01/06/2013   Bright red blood per rectum 01/06/2013   Dehydration 01/06/2013   Hypokalemia 01/06/2013   Hyponatremia 01/06/2013   Acute renal failure (HCC) 01/06/2013   Migraine 03/14/2012   Dizziness 03/14/2012   Bicuspid aortic valve 02/27/2012   HTN (hypertension) 02/27/2012   Hyperlipidemia 02/27/2012   PCP:  Rodrigo Ran, MD Pharmacy:   [id number] - [address] ([address]), [place] - [address] [place] ([place]) [place] [address] Phone: [phone] Fax: [phone]  Social Drivers of Health (SDOH) Social History: SDOH Screenings   Food Insecurity: Patient Unable To Answer (10/12/2023)  Housing: Patient Unable To Answer (10/12/2023)  Transportation Needs: Patient Unable To Answer (10/12/2023)  Utilities: Patient Unable To Answer (10/12/2023)  Depression (PHQ2-9): Low Risk  (10/10/2018)  Tobacco Use: Low Risk  (10/11/2023)   SDOH Interventions:    Readmission Risk Interventions     No data to display

## 2023-10-12 NOTE — Care Management Obs Status (Signed)
MEDICARE OBSERVATION STATUS NOTIFICATION   Patient Details  Name: Bailey [name] MRN: [id number] Date of Birth: [birth year]   Medicare Observation Status Notification Given:  Yes    Ewing Schlein, LCSW 10/12/2023, 4:02 PM

## 2023-10-12 NOTE — Progress Notes (Signed)
MD made aware that patient's daughter wanted an update. Doctor stated she called the patient's spouse twice, but the daughter is the POA and would like an update.

## 2023-10-12 NOTE — Progress Notes (Addendum)
[facility]                                                                              Bailey [name], is a 82 y.o. female, DOB - 1940/12/27, ZOX:[id number] Admit date - 10/11/2023    Outpatient Primary MD for the patient is Rodrigo Ran, MD  LOS - 0  days  Chief Complaint  Patient presents with   Fall       Brief summary   Patient is 82 year old female with dementia, HTN, hyperlipidemia presented after unwitnessed fall resulting in right scapular fracture, T3 compression fracture.  Patient lives at home with her husband. Per EMS report, patient was getting out of the shower on the morning of admission when she fell, no loss of consciousness.  She was complaining of pain in her arm and upper back since the fall.  Patient could not recall the fall.  CT showed acute compression fracture of T3 with approximately 40% vertebral body height and 2 mm of retropulsion of the inferior endplate.  Acute fractures of bilateral bridging anterior osteophytes at T3-T4, mild compression fracture of the superior endplate of T4 with approximately 10% vertebral body height loss and 2 mm of retropulsion of the superior endplate.  Acute nondisplaced fracture of the posterior right 7th through 10th ribs at the costovertebral junction.  Acute comminuted fracture of the spine of the right scapula extending into the infraspinatus fossa and inferior angle.    Assessment & Plan    Principal Problem:   Fall with injury, closed right scapular body fracture -Orthopedics was consulted, per Dr. Roda Shutters, nonoperative treatment, shoulder sling for comfort.  May weight-bear and perform ROM of the shoulder as tolerated -Follow up in the office in about 3 weeks -Shoulder sling placed, PT OT -Continue pain control, bowel regimen  Active Problems: Compression fracture of T3, T4 -MRI thoracic spine showed wedge compression deformities at T3 and T4 with approximately 50% height loss at T3 and 25% at T4.   Diffuse T3 bone marrow edema and superior T4 endplate edema, no retropulsion  -Complains of mid low back pain with movement and deep inspiration -Neurosurgery consulted, discussed with Dr. Danielle Dess.  Recommended conservative management, pain control, PT OT and outpatient follow-up.  Typically T3-T4, difficult to get kyphoplasty and does not need TLSO brace. -PT OT pain control, conservative management, out pt f/u in 2-3 weeks   posterior right 7th through 10th ribs fracture -Will place Lidoderm patch, continue pain control -incentive spirometry    HTN (hypertension) -BP elevated, resume amlodipine, Toprol-XL, Lasix 20 mg daily, - continue hydralazine IV as needed with parameters  GERD -Continue Protonix   Dementia and depression -Continue Aricept, Lexapro  Estimated body mass index is 19.05 kg/m as calculated from the following:   Height as of this encounter: 5' 2.5" (1.588 m).   Weight as of this encounter: 48 kg.  Code Status: DNR DVT Prophylaxis:  enoxaparin (LOVENOX) injection 40 mg Start: 10/11/23 2200 SCDs Start: 10/11/23 1626   Level of Care: Level of care: Med-Surg Family Communication: Updated patient's daughter, [name] [facility])  Disposition Plan:      Remains inpatient appropriate:  PT evaluation pending. Per daughter, patient has dementia, has been having falls, lives with husband who is elderly and has hearing deficit with his own comorbities. It has been getting difficult to care for her at home. Requested  for SNF or CIR for rehab.    Procedures:  None  Consultants:   Neurosurgery  Antimicrobials:   Anti-infectives (From admission, onward)    None          Medications  amLODipine  2.5 mg Oral Daily   cyanocobalamin  1,000 mcg Oral Daily   docusate sodium  100 mg Oral BID   donepezil  5 mg Oral Daily   enoxaparin (LOVENOX) injection  40 mg Subcutaneous QHS   escitalopram  10 mg Oral Daily   furosemide  20 mg Oral Daily   metoprolol  succinate  50 mg Oral Daily   [START ON 10/13/2023] pantoprazole  40 mg Oral QAC breakfast      Subjective:   Bailey [name] was seen and examined today.  Confused, oriented to self only. Don't know why she is in the hospital.  States she has pain on taking a deep breath and movement in her upper back.  No acute abdominal pain, nausea vomiting, fevers.  BP readings elevated  Objective:   Vitals:   10/11/23 2210 10/12/23 0142 10/12/23 0401 10/12/23 0608  BP: 136/74 (!) 174/73 138/63 (!) 178/66  Pulse: 60 (!) 45 (!) 51 (!) 51  Resp:  18  18  Temp:  98 F (36.7 C)  98.5 F (36.9 C)  TempSrc:  Oral  Oral  SpO2:  99%  97%  Weight:      Height:        Intake/Output Summary (Last 24 hours) at 10/12/2023 1018 Last data filed at 10/12/2023 0600 Gross per 24 hour  Intake 120 ml  Output --  Net 120 ml     Wt Readings from Last 3 Encounters:  10/11/23 48 kg  05/17/22 45.8 kg  10/07/19 45.8 kg     Exam General: Alert and oriented x self, NAD confused Cardiovascular: S1 S2 auscultated,  RRR Respiratory: Clear to auscultation bilaterally, no wheezing, rales or rhonchi Gastrointestinal: Soft, nontender, nondistended, + bowel sounds Ext: no pedal edema bilaterally, right shoulder in sling Neuro: Strength 5/5 LE Psych: dementia     Data Reviewed:  I have personally reviewed following labs    CBC Lab Results  Component Value Date   WBC 8.1 10/12/2023   RBC 3.68 (L) 10/12/2023   HGB 11.0 (L) 10/12/2023   HCT 35.0 (L) 10/12/2023   MCV 95.1 10/12/2023   MCH 29.9 10/12/2023   PLT 172 10/12/2023   MCHC 31.4 10/12/2023   RDW 13.1 10/12/2023   LYMPHSABS 1.1 10/11/2023   MONOABS 0.7 10/11/2023   EOSABS 0.0 10/11/2023   BASOSABS 0.0 10/11/2023     Last metabolic panel Lab Results  Component Value Date   NA 136 10/12/2023   K 4.0 10/12/2023   CL 105 10/12/2023   CO2 25 10/12/2023   BUN 23 10/12/2023   CREATININE 0.81 10/12/2023   GLUCOSE 99 10/12/2023    GFRNONAA >60 10/12/2023   GFRAA 59 (L) 08/15/2017   CALCIUM 7.7 (L) 10/12/2023   PROT 6.1 (L) 06/13/2021   ALBUMIN 3.8 06/13/2021   LABGLOB 2.8 08/15/2017   BILITOT 0.8 06/13/2021   ALKPHOS 46 06/13/2021   AST 20 06/13/2021   ALT 6 06/13/2021   ANIONGAP 6 10/12/2023    CBG (last 3)  No  results for input(s): "GLUCAP" in the last 72 hours.    Coagulation Profile: No results for input(s): "INR", "PROTIME" in the last 168 hours.   Radiology Studies: I have personally reviewed the imaging studies  MR THORACIC SPINE WO CONTRAST Result Date: 10/11/2023 CLINICAL DATA:  Fall EXAM: MRI THORACIC SPINE WITHOUT CONTRAST TECHNIQUE: Multiplanar, multisequence MR imaging of the thoracic spine was performed. No intravenous contrast was administered. COMPARISON:  None Available. FINDINGS: Alignment:  Physiologic. Vertebrae: There are wedge compression deformities at T3 and T4 with approximately 50% height loss at T3 and 25% at T4. Diffuse T3 bone marrow edema and superior T4 endplate edema. No retropulsion. Cord:  Normal Paraspinal and other soft tissues: Multiple cystic foci within the liver. Disc levels: No spinal canal stenosis. IMPRESSION: Acute/subacute wedge compression fractures at T3 and T4. Electronically Signed   By: Deatra Robinson M.D.   On: 10/11/2023 20:58   CT CHEST WO CONTRAST Result Date: 10/11/2023 CLINICAL DATA:  Dementia, chest trauma, blunt scapular fracture. Larey Seat this morning getting out of shower. Right arm pain and back pain. EXAM: CT CHEST WITHOUT CONTRAST TECHNIQUE: Multidetector CT imaging of the chest was performed following the standard protocol without IV contrast. RADIATION DOSE REDUCTION: This exam was performed according to the departmental dose-optimization program which includes automated exposure control, adjustment of the mA and/or kV according to patient size and/or use of iterative reconstruction technique. COMPARISON:  Radiographs earlier today FINDINGS:  Cardiovascular: Coronary artery and aortic atherosclerotic calcification. Normal caliber thoracic aorta. Mediastinum/Nodes: Trachea and esophagus are unremarkable. No thoracic adenopathy. Lungs/Pleura: Biapical pleural-parenchymal scarring. Bilateral mild bronchiectasis/bronchiolectasis. Clustered micro nodularity in the right middle lobe. Right middle lobe atelectasis. Bibasilar scarring. No pleural effusion or pneumothorax. Upper Abdomen: Hypoattenuating lesions in the liver are presumed cysts. No acute abnormality. Musculoskeletal: Acute compression fracture of T3 with approximately 40% vertebral body height loss anteriorly. 2 mm of retropulsion of the inferior endplate. Additional fractures of the bilateral bridging anterior osteophytes at T3-T4. Mild compression fracture of the superior endplate of T4 with approximately 10% vertebral body height loss and 2 mm of retropulsion of the superior endplate Acute comminuted fracture of the spine of the right scapula extending into the infraspinatus fossa and inferior angle. Hematoma within the rotator cuff musculature about the fracture. Acute nondisplaced fractures of the posterior right 7th- 10th ribs at the costovertebral junction. IMPRESSION: 1. Acute compression fracture of T3 with approximately 40% vertebral body height and 2 mm of retropulsion of the inferior endplate. 2. Acute fractures of the bilateral bridging anterior osteophytes at T3-T4. 3. Mild compression fracture of the superior endplate of T4 with approximately 10% vertebral body height loss and 2 mm of retropulsion of the superior endplate. 4. Acute comminuted fracture of the spine of the right scapula extending into the infraspinatus fossa and inferior angle. 5. Acute nondisplaced fractures of the posterior right 7th- 10th ribs at the costovertebral junction. Aortic Atherosclerosis (ICD10-I70.0). Electronically Signed   By: Minerva Fester M.D.   On: 10/11/2023 19:54   DG Lumbar Spine 2-3  Views Result Date: 10/11/2023 CLINICAL DATA:  Low back pain after fall. EXAM: LUMBAR SPINE - 2-3 VIEW COMPARISON:  None Available. FINDINGS: Mild grade 1 anterolisthesis of L3-4 is noted most likely due to posterior facet joint hypertrophy. Severe degenerative disc disease is noted at L3-4. No fracture is noted. IMPRESSION: Severe degenerative disc disease of L3-4. No acute abnormality seen. Aortic Atherosclerosis (ICD10-I70.0). Electronically Signed   By: Lupita Raider M.D.   On:  10/11/2023 15:51   DG Thoracic Spine 2 View Result Date: 10/11/2023 CLINICAL DATA:  Upper back pain after fall. EXAM: THORACIC SPINE 2 VIEWS COMPARISON:  None Available. FINDINGS: Mild compression deformity of T3 vertebral body is noted concerning for fracture of indeterminate age. No spondylolisthesis is noted. Disc spaces are well-maintained. IMPRESSION: Mild compression deformity of T3 vertebral body concerning for fracture of indeterminate age. MRI may be performed for further evaluation. Electronically Signed   By: Lupita Raider M.D.   On: 10/11/2023 15:49   DG Shoulder Right Result Date: 10/11/2023 CLINICAL DATA:  Fall with right shoulder pain EXAM: RIGHT SHOULDER - 2+ VIEW COMPARISON:  None Available. FINDINGS: AP and scapular views. There are displaced fractures of the mid and inferior scapular body. No extension into the glenoid. The humeral head is intact. IMPRESSION: Displaced scapular body fractures. Consider further evaluation with CT. Electronically Signed   By: Jeronimo Greaves M.D.   On: 10/11/2023 15:21   DG Chest 2 View Result Date: 10/11/2023 CLINICAL DATA:  Fall with back and right shoulder pain EXAM: CHEST - 2 VIEW COMPARISON:  None Available. FINDINGS: AP and lateral views. The lateral view is moderately degraded by patient arm position, not raised above the head. Midline trachea. Normal heart size. Atherosclerosis in the transverse aorta. No pleural effusion or pneumothorax. Nonspecific mild  interstitial thickening for age. Biapical pleural thickening. IMPRESSION: No acute cardiopulmonary disease. Aortic Atherosclerosis (ICD10-I70.0). Electronically Signed   By: Jeronimo Greaves M.D.   On: 10/11/2023 15:18   CT Head Wo Contrast Result Date: 10/11/2023 CLINICAL DATA:  Fall, arm and back pain, no loss consciousness EXAM: CT HEAD WITHOUT CONTRAST CT CERVICAL SPINE WITHOUT CONTRAST TECHNIQUE: Multidetector CT imaging of the head and cervical spine was performed following the standard protocol without intravenous contrast. Multiplanar CT image reconstructions of the cervical spine were also generated. RADIATION DOSE REDUCTION: This exam was performed according to the departmental dose-optimization program which includes automated exposure control, adjustment of the mA and/or kV according to patient size and/or use of iterative reconstruction technique. COMPARISON:  06/13/2021 CT head, no prior CT cervical spine available, correlation is made with 12/04/2016 MRI cervical spine FINDINGS: CT HEAD FINDINGS Brain: No evidence of acute infarct, hemorrhage, mass, mass effect, or midline shift. No hydrocephalus or extra-axial fluid collection. Age related cerebral volume loss. Vascular: No hyperdense vessel. Skull: Negative for fracture or focal lesion. Sinuses/Orbits: No acute finding. Other: The mastoid air cells are well aerated. CT CERVICAL SPINE FINDINGS Alignment: No traumatic listhesis. Reversal of the normal cervical lordosis with anterolisthesis of C4 on C5 and C7 on T1 and retrolisthesis of C5 on C6, which appears facet mediated. Skull base and vertebrae: No acute fracture or suspicious osseous lesion. Soft tissues and spinal canal: No prevertebral fluid or swelling. No visible canal hematoma. Disc levels: Degenerative changes in the cervical spine.Moderate spinal canal stenosis at C5-C6. Upper chest: No focal pulmonary opacity or pleural effusion. IMPRESSION: 1. No acute intracranial process. 2. No acute  fracture or traumatic listhesis in the cervical spine. Electronically Signed   By: Wiliam Ke M.D.   On: 10/11/2023 13:34   CT Cervical Spine Wo Contrast Result Date: 10/11/2023 CLINICAL DATA:  Fall, arm and back pain, no loss consciousness EXAM: CT HEAD WITHOUT CONTRAST CT CERVICAL SPINE WITHOUT CONTRAST TECHNIQUE: Multidetector CT imaging of the head and cervical spine was performed following the standard protocol without intravenous contrast. Multiplanar CT image reconstructions of the cervical spine were also generated. RADIATION DOSE  REDUCTION: This exam was performed according to the departmental dose-optimization program which includes automated exposure control, adjustment of the mA and/or kV according to patient size and/or use of iterative reconstruction technique. COMPARISON:  06/13/2021 CT head, no prior CT cervical spine available, correlation is made with 12/04/2016 MRI cervical spine FINDINGS: CT HEAD FINDINGS Brain: No evidence of acute infarct, hemorrhage, mass, mass effect, or midline shift. No hydrocephalus or extra-axial fluid collection. Age related cerebral volume loss. Vascular: No hyperdense vessel. Skull: Negative for fracture or focal lesion. Sinuses/Orbits: No acute finding. Other: The mastoid air cells are well aerated. CT CERVICAL SPINE FINDINGS Alignment: No traumatic listhesis. Reversal of the normal cervical lordosis with anterolisthesis of C4 on C5 and C7 on T1 and retrolisthesis of C5 on C6, which appears facet mediated. Skull base and vertebrae: No acute fracture or suspicious osseous lesion. Soft tissues and spinal canal: No prevertebral fluid or swelling. No visible canal hematoma. Disc levels: Degenerative changes in the cervical spine.Moderate spinal canal stenosis at C5-C6. Upper chest: No focal pulmonary opacity or pleural effusion. IMPRESSION: 1. No acute intracranial process. 2. No acute fracture or traumatic listhesis in the cervical spine. Electronically Signed    By: Wiliam Ke M.D.   On: 10/11/2023 13:34       Sonali Wivell M.D. [facility] 10/12/2023, 10:18 AM  Available via Epic secure chat 7am-7pm After 7 pm, please refer to night coverage provider listed on amion.

## 2023-10-13 DIAGNOSIS — G9009 Other idiopathic peripheral autonomic neuropathy: Secondary | ICD-10-CM | POA: Diagnosis not present

## 2023-10-13 DIAGNOSIS — M19042 Primary osteoarthritis, left hand: Secondary | ICD-10-CM | POA: Diagnosis present

## 2023-10-13 DIAGNOSIS — S42101A Fracture of unspecified part of scapula, right shoulder, initial encounter for closed fracture: Secondary | ICD-10-CM | POA: Diagnosis not present

## 2023-10-13 DIAGNOSIS — S2241XA Multiple fractures of ribs, right side, initial encounter for closed fracture: Secondary | ICD-10-CM | POA: Diagnosis present

## 2023-10-13 DIAGNOSIS — M19041 Primary osteoarthritis, right hand: Secondary | ICD-10-CM | POA: Diagnosis present

## 2023-10-13 DIAGNOSIS — F32A Depression, unspecified: Secondary | ICD-10-CM | POA: Diagnosis not present

## 2023-10-13 DIAGNOSIS — K219 Gastro-esophageal reflux disease without esophagitis: Secondary | ICD-10-CM | POA: Diagnosis not present

## 2023-10-13 DIAGNOSIS — R2681 Unsteadiness on feet: Secondary | ICD-10-CM | POA: Diagnosis not present

## 2023-10-13 DIAGNOSIS — S42111A Displaced fracture of body of scapula, right shoulder, initial encounter for closed fracture: Secondary | ICD-10-CM | POA: Diagnosis present

## 2023-10-13 DIAGNOSIS — I495 Sick sinus syndrome: Secondary | ICD-10-CM | POA: Diagnosis not present

## 2023-10-13 DIAGNOSIS — W19XXXD Unspecified fall, subsequent encounter: Secondary | ICD-10-CM | POA: Diagnosis not present

## 2023-10-13 DIAGNOSIS — I10 Essential (primary) hypertension: Secondary | ICD-10-CM | POA: Diagnosis present

## 2023-10-13 DIAGNOSIS — I351 Nonrheumatic aortic (valve) insufficiency: Secondary | ICD-10-CM | POA: Diagnosis not present

## 2023-10-13 DIAGNOSIS — Y92012 Bathroom of single-family (private) house as the place of occurrence of the external cause: Secondary | ICD-10-CM | POA: Diagnosis not present

## 2023-10-13 DIAGNOSIS — Z85828 Personal history of other malignant neoplasm of skin: Secondary | ICD-10-CM | POA: Diagnosis not present

## 2023-10-13 DIAGNOSIS — D649 Anemia, unspecified: Secondary | ICD-10-CM | POA: Diagnosis present

## 2023-10-13 DIAGNOSIS — E890 Postprocedural hypothyroidism: Secondary | ICD-10-CM | POA: Diagnosis present

## 2023-10-13 DIAGNOSIS — E785 Hyperlipidemia, unspecified: Secondary | ICD-10-CM | POA: Diagnosis not present

## 2023-10-13 DIAGNOSIS — Z8249 Family history of ischemic heart disease and other diseases of the circulatory system: Secondary | ICD-10-CM | POA: Diagnosis not present

## 2023-10-13 DIAGNOSIS — E876 Hypokalemia: Secondary | ICD-10-CM | POA: Diagnosis present

## 2023-10-13 DIAGNOSIS — R001 Bradycardia, unspecified: Secondary | ICD-10-CM | POA: Diagnosis not present

## 2023-10-13 DIAGNOSIS — S22030D Wedge compression fracture of third thoracic vertebra, subsequent encounter for fracture with routine healing: Secondary | ICD-10-CM | POA: Diagnosis not present

## 2023-10-13 DIAGNOSIS — R296 Repeated falls: Secondary | ICD-10-CM | POA: Diagnosis present

## 2023-10-13 DIAGNOSIS — W1839XA Other fall on same level, initial encounter: Secondary | ICD-10-CM | POA: Diagnosis present

## 2023-10-13 DIAGNOSIS — Z886 Allergy status to analgesic agent status: Secondary | ICD-10-CM | POA: Diagnosis not present

## 2023-10-13 DIAGNOSIS — Z66 Do not resuscitate: Secondary | ICD-10-CM | POA: Diagnosis present

## 2023-10-13 DIAGNOSIS — F419 Anxiety disorder, unspecified: Secondary | ICD-10-CM | POA: Diagnosis present

## 2023-10-13 DIAGNOSIS — M4854XA Collapsed vertebra, not elsewhere classified, thoracic region, initial encounter for fracture: Secondary | ICD-10-CM | POA: Diagnosis present

## 2023-10-13 DIAGNOSIS — R41841 Cognitive communication deficit: Secondary | ICD-10-CM | POA: Diagnosis not present

## 2023-10-13 DIAGNOSIS — Z7952 Long term (current) use of systemic steroids: Secondary | ICD-10-CM | POA: Diagnosis not present

## 2023-10-13 DIAGNOSIS — M6281 Muscle weakness (generalized): Secondary | ICD-10-CM | POA: Diagnosis not present

## 2023-10-13 DIAGNOSIS — Z79899 Other long term (current) drug therapy: Secondary | ICD-10-CM | POA: Diagnosis not present

## 2023-10-13 DIAGNOSIS — H9313 Tinnitus, bilateral: Secondary | ICD-10-CM | POA: Diagnosis not present

## 2023-10-13 DIAGNOSIS — F01518 Vascular dementia, unspecified severity, with other behavioral disturbance: Secondary | ICD-10-CM | POA: Diagnosis not present

## 2023-10-13 DIAGNOSIS — W19XXXA Unspecified fall, initial encounter: Secondary | ICD-10-CM | POA: Diagnosis not present

## 2023-10-13 DIAGNOSIS — F0393 Unspecified dementia, unspecified severity, with mood disturbance: Secondary | ICD-10-CM | POA: Diagnosis present

## 2023-10-13 DIAGNOSIS — Q2381 Bicuspid aortic valve: Secondary | ICD-10-CM | POA: Diagnosis not present

## 2023-10-13 DIAGNOSIS — S22030A Wedge compression fracture of third thoracic vertebra, initial encounter for closed fracture: Secondary | ICD-10-CM | POA: Diagnosis not present

## 2023-10-13 MED ORDER — HALOPERIDOL LACTATE 5 MG/ML IJ SOLN
5.0000 mg | Freq: Four times a day (QID) | INTRAMUSCULAR | Status: DC | PRN
  Administered 2023-10-13 – 2023-10-15 (×3): 5 mg via INTRAVENOUS
  Filled 2023-10-13 (×6): qty 1

## 2023-10-13 MED ORDER — AMLODIPINE BESYLATE 5 MG PO TABS
5.0000 mg | ORAL_TABLET | Freq: Every day | ORAL | Status: DC
  Administered 2023-10-14: 5 mg via ORAL
  Filled 2023-10-13: qty 1

## 2023-10-13 NOTE — Evaluation (Signed)
Occupational Therapy Evaluation Patient Details Name: Bailey [name] MRN: [id number] DOB: 11-10-1940 Today's Date: 10/13/2023   History of Present Illness Patient is 82 year old female with dementia, HTN, hyperlipidemia presented after unwitnessed fall resulting in right scapular fracture, T3 compression fracture   Clinical Impression   Pt presents with decline in function and safety with ADLs and ADL mobility with impaired strength, balance and endurance; pt with hx of dementia and falls. PTA pt lived at home with her husband (he has mobility issued, on O2) and pt was Ind with ADLs/selfcare, light home mgt, cooking, did not drive. Pt currently required min A with UB ADLs, mod A with LB ADLs CGA/min A with mobility/transfers. Pt wearing R UE sling for comfort and is limited by pain although MD ok with WBAT and ROM as tolerated. Pt would benefit from acute OT services to maximize level of function and safety    If plan is discharge home, recommend the following: A lot of help with bathing/dressing/bathroom;A little help with walking and/or transfers;Assistance with cooking/housework;Supervision due to cognitive status;Help with stairs or ramp for entrance    Functional Status Assessment  Patient has had a recent decline in their functional status and demonstrates the ability to make significant improvements in function in a reasonable and predictable amount of time.  Equipment Recommendations  BSC/3in1;Other (comment) (RW)    Recommendations for Other Services       Precautions / Restrictions Precautions Precautions: Fall Precaution Comments: sling R UE Required Braces or Orthoses: Sling Restrictions Weight Bearing Restrictions Per Provider Order: Yes RUE Weight Bearing Per Provider Order: Weight bearing as tolerated      Mobility Bed Mobility Overal bed mobility: Needs Assistance Bed Mobility: Supine to Sit, Sit to Supine     Supine to sit: Min assist Sit to supine: Contact  guard assist   General bed mobility comments: assist to elevate trunk    Transfers Overall transfer level: Needs assistance Equipment used: Rolling walker (2 wheels), 1 person hand held assist Transfers: Sit to/from Stand, Bed to chair/wheelchair/BSC Sit to Stand: Min assist     Step pivot transfers: Min assist, Contact guard assist            Balance Overall balance assessment: Needs assistance Sitting-balance support: Feet supported, Single extremity supported Sitting balance-Leahy Scale: Good     Standing balance support: Bilateral upper extremity supported, Single extremity supported, During functional activity Standing balance-Leahy Scale: Poor                             ADL either performed or assessed with clinical judgement   ADL Overall ADL's : Needs assistance/impaired Eating/Feeding: Set up;Sitting   Grooming: Wash/dry hands;Wash/dry face;Oral care;Minimal assistance;Contact guard assist;Standing   Upper Body Bathing: Minimal assistance   Lower Body Bathing: Moderate assistance   Upper Body Dressing : Minimal assistance   Lower Body Dressing: Moderate assistance   Toilet Transfer: Contact guard assist;Ambulation;Rolling walker (2 wheels);Cueing for safety Toilet Transfer Details (indicate cue type and reason): 1 person HHA trialed with min A/CGA for balance Toileting- Clothing Manipulation and Hygiene: Minimal assistance;Sit to/from stand       Functional mobility during ADLs: Minimal assistance;Contact guard assist;Rolling walker (2 wheels);Cueing for safety General ADL Comments: Pt with decreased insight into deficits, hx of demential with STM impaired     Vision Baseline Vision/History: 1 Wears glasses Ability to See in Adequate Light: 0 Adequate Patient Visual Report: No change from baseline  Perception         Praxis         Pertinent Vitals/Pain Pain Assessment Pain Assessment: 0-10 Pain Score: 7  Pain Location:  R UE Pain Descriptors / Indicators: Aching, Sore Pain Intervention(s): Limited activity within patient's tolerance, Monitored during session, Premedicated before session, Repositioned     Extremity/Trunk Assessment Upper Extremity Assessment Upper Extremity Assessment: Generalized weakness;RUE deficits/detail RUE Deficits / Details: sling for comfort, per orthos MD pt allowed to WBAT and ROM as tolerated RUE: Shoulder pain at rest           Communication Communication Communication: No apparent difficulties Cueing Techniques: Verbal cues   Cognition Arousal: Alert Behavior During Therapy: WFL for tasks assessed/performed, Impulsive Overall Cognitive Status: History of cognitive impairments - at baseline                                 General Comments: impaired safety awarreness, STM deficits. Pt cannot recall how she fell or why she is wearing a sling     General Comments       Exercises     Shoulder Instructions      Home Living Family/patient expects to be discharged to:: Private residence Living Arrangements: Spouse/significant other Available Help at Discharge: Family;Available 24 hours/day Type of Home: House Home Access: Stairs to enter [employer] of Steps: 2 Entrance Stairs-Rails: Can reach both Home Layout: Two level;Able to live on main level with bedroom/bathroom     Bathroom Shower/Tub: Chief Strategy Officer: Standard     Home Equipment: Shower seat;Grab bars - tub/shower          Prior Functioning/Environment Prior Level of Function : Independent/Modified Independent             Mobility Comments: no AD for mobility ADLs Comments: Ind with ADLs/selfcare, cooking, light home mgt        OT Problem List: Decreased strength;Impaired balance (sitting and/or standing);Decreased cognition;Pain;Decreased safety awareness;Decreased range of motion;Decreased activity tolerance;Decreased  coordination;Decreased knowledge of use of DME or AE;Impaired UE functional use      OT Treatment/Interventions: Self-care/ADL training;DME and/or AE instruction;Therapeutic activities;Balance training;Therapeutic exercise;Patient/family education    OT Goals(Current goals can be found in the care plan section) Acute Rehab OT Goals Patient Stated Goal: "do whatever is best to be able to go home" OT Goal Formulation: With patient/family Time For Goal Achievement: 10/27/23 Potential to Achieve Goals: Good ADL Goals Pt Will Perform Grooming: with contact guard assist;with supervision;standing Pt Will Perform Upper Body Bathing: with contact guard assist Pt Will Perform Lower Body Bathing: with min assist Pt Will Perform Upper Body Dressing: with contact guard assist Pt Will Transfer to Toilet: with contact guard assist;with supervision;ambulating Pt Will Perform Toileting - Clothing Manipulation and hygiene: with contact guard assist;with supervision;sit to/from stand  OT Frequency: Min 2X/week    Co-evaluation              AM-PAC OT "6 Clicks" Daily Activity     Outcome Measure Help from another person eating meals?: None Help from another person taking care of personal grooming?: A Little Help from another person toileting, which includes using toliet, bedpan, or urinal?: A Little Help from another person bathing (including washing, rinsing, drying)?: A Lot Help from another person to put on and taking off regular upper body clothing?: A Little Help from another person to put on and taking off regular lower  body clothing?: A Lot 6 Click Score: 17   End of Session Equipment Utilized During Treatment: Gait belt;Rolling walker (2 wheels)  Activity Tolerance: Patient limited by pain Patient left: in bed;with call bell/phone within reach;with bed alarm set;with family/visitor present  OT Visit Diagnosis: Unsteadiness on feet (R26.81);Other abnormalities of gait and mobility  (R26.89);Muscle weakness (generalized) (M62.81);History of falling (Z91.81);Pain Pain - Right/Left: Right Pain - part of body: Shoulder                Time: 4098-1191 OT Time Calculation (min): 25 min Charges:  OT General Charges $OT Visit: 1 Visit OT Evaluation $OT Eval Moderate Complexity: 1 Mod OT Treatments $Therapeutic Activity: 8-22 mins    Galen Manila 10/13/2023, 12:57 PM

## 2023-10-13 NOTE — Evaluation (Signed)
Physical Therapy Evaluation Patient Details Name: Bailey [name] MRN: [id number] DOB: 11/15/1940 Today's Date: 10/13/2023  History of Present Illness  Patient is 82 year old female with dementia, HTN, hyperlipidemia presented after unwitnessed fall resulting in right scapular fracture, T3 compression fracture  Clinical Impression  Pt admitted with above diagnosis.  Pt presents with decline in function and safety with  mobility with impaired strength, balance and endurance; pt with hx of dementia and falls. PTA pt lived at home with her husband (he has mobility issues, on O2) and pt was Ind with ADLs/selfcare, light home mgt, cooking, did not drive. Pt currently required min A  for functional mobility and safety cues throughout for fall prevention. Patient will benefit from continued follow up therapy, <3 hours/day at d/c   Pt currently with functional limitations due to the deficits listed below (see PT Problem List). Pt will benefit from acute skilled PT to increase their independence and safety with mobility to allow discharge.           If plan is discharge home, recommend the following: A little help with walking and/or transfers;A little help with bathing/dressing/bathroom;Assistance with cooking/housework;Direct supervision/assist for financial management;Supervision due to cognitive status;Help with stairs or ramp for entrance;Assist for transportation   Can travel by private vehicle   Yes    Equipment Recommendations None recommended by PT (defer to next venue)  Recommendations for Other Services       Functional Status Assessment Patient has had a recent decline in their functional status and demonstrates the ability to make significant improvements in function in a reasonable and predictable amount of time.     Precautions / Restrictions Precautions Precautions: Fall Precaution Comments: sling R UE-for comfort, may have sling off, ROM R  shoulder as tolerated Required  Braces or Orthoses: Sling Restrictions Weight Bearing Restrictions Per Provider Order: Yes RUE Weight Bearing Per Provider Order: Weight bearing as tolerated      Mobility  Bed Mobility Overal bed mobility: Needs Assistance Bed Mobility: Supine to Sit, Sit to Supine     Supine to sit: Contact guard, Min assist Sit to supine: Supervision, Contact guard assist   General bed mobility comments: light assist to guide trunk to upright    Transfers Overall transfer level: Needs assistance Equipment used: Rolling walker (2 wheels), 1 person hand held assist Transfers: Sit to/from Stand Sit to Stand: Min assist, Contact guard assist           General transfer comment: pt stands without RW and is unsteady without device, min assist to maintain midline initially. cues for safety needed    Ambulation/Gait Ambulation/Gait assistance: Min assist Gait Distance (Feet): 80 Feet Assistive device: Rolling walker (2 wheels) Gait Pattern/deviations: Step-through pattern, Narrow base of support       General Gait Details: cues for RW safety, LOB x2 with min assist to recover  Stairs            Wheelchair Mobility     Tilt Bed    Modified Rankin (Stroke Patients Only)       Balance Overall balance assessment: Needs assistance Sitting-balance support: Feet supported, Single extremity supported Sitting balance-Leahy Scale: Good     Standing balance support: During functional activity, Reliant on assistive device for balance Standing balance-Leahy Scale: Poor Standing balance comment: assist needed without device, CGA to stand with RW support  Pertinent Vitals/Pain Pain Assessment Pain Assessment: Faces Faces Pain Scale: Hurts little more Pain Location: R UE Pain Descriptors / Indicators: Aching, Sore Pain Intervention(s): Limited activity within patient's tolerance, Monitored during session, Repositioned    Home Living  Family/patient expects to be discharged to:: Private residence Living Arrangements: Spouse/significant other Available Help at Discharge: Family;Available 24 hours/day Type of Home: House Home Access: Stairs to enter Entrance Stairs-Rails: Can reach both Entrance Stairs-Number of Steps: 2   Home Layout: Two level;Able to live on main level with bedroom/bathroom Home Equipment: Shower seat;Grab bars - tub/shower Additional Comments: husband is on O2, unable to provide assist per pt dtr    Prior Function Prior Level of Function : Independent/Modified Independent             Mobility Comments: no AD for mobility ADLs Comments: Ind with ADLs/selfcare, cooking, light home mgt     Extremity/Trunk Assessment   Upper Extremity Assessment Upper Extremity Assessment: Defer to OT evaluation RUE Deficits / Details: sling for comfort, per orthos MD pt allowed to WBAT and ROM as tolerated RUE: Shoulder pain at rest    Lower Extremity Assessment Lower Extremity Assessment: Generalized weakness       Communication   Communication Communication: No apparent difficulties Cueing Techniques: Verbal cues  Cognition Arousal: Alert Behavior During Therapy: Impulsive Overall Cognitive Status: History of cognitive impairments - at baseline                                 General Comments: impaired safety awarreness, STM deficits. Pt cannot recall how she fell or why she is wearing a sling; repeats self and has no immediate recall.        General Comments      Exercises     Assessment/Plan    PT Assessment Patient needs continued PT services  PT Problem List Decreased strength;Decreased range of motion;Decreased activity tolerance;Decreased balance;Decreased mobility;Decreased knowledge of precautions;Pain;Decreased knowledge of use of DME;Decreased cognition       PT Treatment Interventions DME instruction;Gait training;Functional mobility training;Therapeutic  activities;Therapeutic exercise;Patient/family education;Balance training    PT Goals (Current goals can be found in the Care Plan section)  Acute Rehab PT Goals Patient Stated Goal: rehab PT Goal Formulation: With patient/family Time For Goal Achievement: 10/26/23 Potential to Achieve Goals: Good    Frequency Min 1X/week     Co-evaluation               AM-PAC PT "6 Clicks" Mobility  Outcome Measure Help needed turning from your back to your side while in a flat bed without using bedrails?: A Little Help needed moving from lying on your back to sitting on the side of a flat bed without using bedrails?: A Little Help needed moving to and from a bed to a chair (including a wheelchair)?: A Little Help needed standing up from a chair using your arms (e.g., wheelchair or bedside chair)?: A Little Help needed to walk in hospital room?: A Little Help needed climbing 3-5 steps with a railing? : A Little 6 Click Score: 18    End of Session Equipment Utilized During Treatment: Gait belt Activity Tolerance: Patient tolerated treatment well Patient left: with call bell/phone within reach;in bed;with family/visitor present;with bed alarm set   PT Visit Diagnosis: Other abnormalities of gait and mobility (R26.89);Difficulty in walking, not elsewhere classified (R26.2)    Time: 2595-6387 PT Time Calculation (min) (ACUTE ONLY): 17 min  Charges:   PT Evaluation $PT Eval Low Complexity: 1 Low   PT General Charges $$ ACUTE PT VISIT: 1 Visit         Martin Belling, PT  Acute Rehab Dept [facility]) [phone]  10/13/2023   [facility] 10/13/2023, 2:37 PM

## 2023-10-13 NOTE — Progress Notes (Addendum)
PROGRESS NOTE    Bailey [name]  ZOX:[id number] DOB: [birth year] DOA: 10/11/2023 PCP: Rodrigo Ran, MD   Brief Narrative:  Patient is 82 year old female with dementia, HTN, hyperlipidemia presented after unwitnessed fall resulting in right scapular fracture, T3 compression fracture.  Patient lives at home with her husband. Per EMS report, patient was getting out of the shower on the morning of admission when she fell, no loss of consciousness.  She was complaining of pain in her arm and upper back since the fall.  Patient could not recall the fall.   CT showed acute compression fracture of T3 with approximately 40% vertebral body height and 2 mm of retropulsion of the inferior endplate.  Acute fractures of bilateral bridging anterior osteophytes at T3-T4, mild compression fracture of the superior endplate of T4 with approximately 10% vertebral body height loss and 2 mm of retropulsion of the superior endplate.  Acute nondisplaced fracture of the posterior right 7th through 10th ribs at the costovertebral junction.  Acute comminuted fracture of the spine of the right scapula extending into the infraspinatus fossa and inferior angle.   Assessment & Plan:   Fall with injury, closed right scapular body fracture -Orthopedics was consulted, per Dr. Roda Shutters, nonoperative treatment, shoulder sling for comfort.  May weight-bear and perform ROM of the shoulder as tolerated -Follow up in the office in about 3 weeks -Shoulder sling placed -Continue pain control, bowel regimen -Await PT/OT evaluation.  TOC on board   Compression fracture of T3, T4 -MRI thoracic spine showed wedge compression deformities at T3 and T4 with approximately 50% height loss at T3 and 25% at T4.  Diffuse T3 bone marrow edema and superior T4 endplate edema, no retropulsion  -Complains of mid low back pain with movement and deep inspiration -Neurosurgery consulted- Dr. Danielle Dess.  Recommended conservative management, pain control, PT OT  and outpatient follow-up.  Typically T3-T4, difficult to get kyphoplasty and does not need TLSO brace. -Continue pain control, conservative management, out pt f/u in 2-3 weeks    posterior right 7th through 10th ribs fracture -Sinew as needed pain control -incentive spirometry   HTN (hypertension) -BP elevated.  Could be due to pain. -Continue home medications.  Increase amlodipine from 2.5 mg to 5 mg. -continue hydralazine and labetalol IV as needed with parameters -Monitor BP closely  Normocytic anemia: H&H is stable.  Continue to monitor  GERD -Continue Protonix   Dementia and depression -Continue Aricept, Lexapro  DVT prophylaxis: Lovenox Code Status: DNR Family Communication:  None present at bedside.  Plan of care discussed with patient in length and she verbalized understanding and agreed with it.  I called patient's daughter and update her.  She appreciated the call  Disposition Plan: TBD  Consultants:  Orthopedic Neurosurgery  Procedures:  None  Antimicrobials:  None  Status is: Observation    Subjective: Patient seen and examined.  Sitting comfortably on the bed and eating breakfast.  Pleasantly confused.  She is unsure why she is in the hospital and for how long she had been in the hospital.  Reports pain in right shoulder with movement.  Remained afebrile.  No acute events overnight.  Objective: Vitals:   10/12/23 1354 10/12/23 2134 10/12/23 2240 10/13/23 0639  BP: (!) 160/70 (!) 162/67 (!) 150/87 (!) 187/83  Pulse: (!) 52 (!) 48 (!) 50 (!) 51  Resp:    16  Temp: 98.2 F (36.8 C) 98.6 F (37 C)  98.2 F (36.8 C)  TempSrc: Oral Oral  Oral  SpO2: 97% 98%  95%  Weight:      Height:        Intake/Output Summary (Last 24 hours) at 10/13/2023 1039 Last data filed at 10/13/2023 3664 Gross per 24 hour  Intake 600 ml  Output 0 ml  Net 600 ml   Filed Weights   10/11/23 1101  Weight: 48 kg    Examination:  General exam: Appears calm and  comfortable, on room air, sitting comfortably and eating breakfast, pleasantly confused Respiratory system: Clear to auscultation. Respiratory effort normal. Cardiovascular system: S1 & S2 heard, RRR. No JVD, murmurs, rubs, gallops or clicks. No pedal edema. Gastrointestinal system: Abdomen is nondistended, soft and nontender. No organomegaly or masses felt. Normal bowel sounds heard. Central nervous system: Alert, oriented to self only  Musculoskeletal: Right arm in sling     Data Reviewed: I have personally reviewed following labs and imaging studies  CBC: Recent Labs  Lab 10/11/23 1245 10/12/23 0428  WBC 11.3* 8.1  NEUTROABS 9.4*  --   HGB 11.7* 11.0*  HCT 35.0* 35.0*  MCV 93.1 95.1  PLT 145* 172   Basic Metabolic Panel: Recent Labs  Lab 10/11/23 1245 10/12/23 0428  NA 139 136  K 3.2* 4.0  CL 107 105  CO2 23 25  GLUCOSE 105* 99  BUN 23 23  CREATININE 0.83 0.81  CALCIUM 8.4* 7.7*   GFR: Estimated Creatinine Clearance: 41.3 mL/min (by C-G formula based on SCr of 0.81 mg/dL). Liver Function Tests: No results for input(s): "AST", "ALT", "ALKPHOS", "BILITOT", "PROT", "ALBUMIN" in the last 168 hours. No results for input(s): "LIPASE", "AMYLASE" in the last 168 hours. No results for input(s): "AMMONIA" in the last 168 hours. Coagulation Profile: No results for input(s): "INR", "PROTIME" in the last 168 hours. Cardiac Enzymes: No results for input(s): "CKTOTAL", "CKMB", "CKMBINDEX", "TROPONINI" in the last 168 hours. BNP (last 3 results) No results for input(s): "PROBNP" in the last 8760 hours. HbA1C: No results for input(s): "HGBA1C" in the last 72 hours. CBG: No results for input(s): "GLUCAP" in the last 168 hours. Lipid Profile: No results for input(s): "CHOL", "HDL", "LDLCALC", "TRIG", "CHOLHDL", "LDLDIRECT" in the last 72 hours. Thyroid Function Tests: No results for input(s): "TSH", "T4TOTAL", "FREET4", "T3FREE", "THYROIDAB" in the last 72 hours. Anemia  Panel: No results for input(s): "VITAMINB12", "FOLATE", "FERRITIN", "TIBC", "IRON", "RETICCTPCT" in the last 72 hours. Sepsis Labs: No results for input(s): "PROCALCITON", "LATICACIDVEN" in the last 168 hours.  No results found for this or any previous visit (from the past 240 hours).    Radiology Studies: MR THORACIC SPINE WO CONTRAST Result Date: 10/11/2023 CLINICAL DATA:  Fall EXAM: MRI THORACIC SPINE WITHOUT CONTRAST TECHNIQUE: Multiplanar, multisequence MR imaging of the thoracic spine was performed. No intravenous contrast was administered. COMPARISON:  None Available. FINDINGS: Alignment:  Physiologic. Vertebrae: There are wedge compression deformities at T3 and T4 with approximately 50% height loss at T3 and 25% at T4. Diffuse T3 bone marrow edema and superior T4 endplate edema. No retropulsion. Cord:  Normal Paraspinal and other soft tissues: Multiple cystic foci within the liver. Disc levels: No spinal canal stenosis. IMPRESSION: Acute/subacute wedge compression fractures at T3 and T4. Electronically Signed   By: Deatra Robinson M.D.   On: 10/11/2023 20:58   CT CHEST WO CONTRAST Result Date: 10/11/2023 CLINICAL DATA:  Dementia, chest trauma, blunt scapular fracture. Larey Seat this morning getting out of shower. Right arm pain and back pain. EXAM: CT CHEST WITHOUT CONTRAST TECHNIQUE: Multidetector CT  imaging of the chest was performed following the standard protocol without IV contrast. RADIATION DOSE REDUCTION: This exam was performed according to the departmental dose-optimization program which includes automated exposure control, adjustment of the mA and/or kV according to patient size and/or use of iterative reconstruction technique. COMPARISON:  Radiographs earlier today FINDINGS: Cardiovascular: Coronary artery and aortic atherosclerotic calcification. Normal caliber thoracic aorta. Mediastinum/Nodes: Trachea and esophagus are unremarkable. No thoracic adenopathy. Lungs/Pleura: Biapical  pleural-parenchymal scarring. Bilateral mild bronchiectasis/bronchiolectasis. Clustered micro nodularity in the right middle lobe. Right middle lobe atelectasis. Bibasilar scarring. No pleural effusion or pneumothorax. Upper Abdomen: Hypoattenuating lesions in the liver are presumed cysts. No acute abnormality. Musculoskeletal: Acute compression fracture of T3 with approximately 40% vertebral body height loss anteriorly. 2 mm of retropulsion of the inferior endplate. Additional fractures of the bilateral bridging anterior osteophytes at T3-T4. Mild compression fracture of the superior endplate of T4 with approximately 10% vertebral body height loss and 2 mm of retropulsion of the superior endplate Acute comminuted fracture of the spine of the right scapula extending into the infraspinatus fossa and inferior angle. Hematoma within the rotator cuff musculature about the fracture. Acute nondisplaced fractures of the posterior right 7th- 10th ribs at the costovertebral junction. IMPRESSION: 1. Acute compression fracture of T3 with approximately 40% vertebral body height and 2 mm of retropulsion of the inferior endplate. 2. Acute fractures of the bilateral bridging anterior osteophytes at T3-T4. 3. Mild compression fracture of the superior endplate of T4 with approximately 10% vertebral body height loss and 2 mm of retropulsion of the superior endplate. 4. Acute comminuted fracture of the spine of the right scapula extending into the infraspinatus fossa and inferior angle. 5. Acute nondisplaced fractures of the posterior right 7th- 10th ribs at the costovertebral junction. Aortic Atherosclerosis (ICD10-I70.0). Electronically Signed   By: Minerva Fester M.D.   On: 10/11/2023 19:54   DG Lumbar Spine 2-3 Views Result Date: 10/11/2023 CLINICAL DATA:  Low back pain after fall. EXAM: LUMBAR SPINE - 2-3 VIEW COMPARISON:  None Available. FINDINGS: Mild grade 1 anterolisthesis of L3-4 is noted most likely due to posterior  facet joint hypertrophy. Severe degenerative disc disease is noted at L3-4. No fracture is noted. IMPRESSION: Severe degenerative disc disease of L3-4. No acute abnormality seen. Aortic Atherosclerosis (ICD10-I70.0). Electronically Signed   By: Lupita Raider M.D.   On: 10/11/2023 15:51   DG Thoracic Spine 2 View Result Date: 10/11/2023 CLINICAL DATA:  Upper back pain after fall. EXAM: THORACIC SPINE 2 VIEWS COMPARISON:  None Available. FINDINGS: Mild compression deformity of T3 vertebral body is noted concerning for fracture of indeterminate age. No spondylolisthesis is noted. Disc spaces are well-maintained. IMPRESSION: Mild compression deformity of T3 vertebral body concerning for fracture of indeterminate age. MRI may be performed for further evaluation. Electronically Signed   By: Lupita Raider M.D.   On: 10/11/2023 15:49   DG Shoulder Right Result Date: 10/11/2023 CLINICAL DATA:  Fall with right shoulder pain EXAM: RIGHT SHOULDER - 2+ VIEW COMPARISON:  None Available. FINDINGS: AP and scapular views. There are displaced fractures of the mid and inferior scapular body. No extension into the glenoid. The humeral head is intact. IMPRESSION: Displaced scapular body fractures. Consider further evaluation with CT. Electronically Signed   By: Jeronimo Greaves M.D.   On: 10/11/2023 15:21   DG Chest 2 View Result Date: 10/11/2023 CLINICAL DATA:  Fall with back and right shoulder pain EXAM: CHEST - 2 VIEW COMPARISON:  None Available. FINDINGS:  AP and lateral views. The lateral view is moderately degraded by patient arm position, not raised above the head. Midline trachea. Normal heart size. Atherosclerosis in the transverse aorta. No pleural effusion or pneumothorax. Nonspecific mild interstitial thickening for age. Biapical pleural thickening. IMPRESSION: No acute cardiopulmonary disease. Aortic Atherosclerosis (ICD10-I70.0). Electronically Signed   By: Jeronimo Greaves M.D.   On: 10/11/2023 15:18   CT Head Wo  Contrast Result Date: 10/11/2023 CLINICAL DATA:  Fall, arm and back pain, no loss consciousness EXAM: CT HEAD WITHOUT CONTRAST CT CERVICAL SPINE WITHOUT CONTRAST TECHNIQUE: Multidetector CT imaging of the head and cervical spine was performed following the standard protocol without intravenous contrast. Multiplanar CT image reconstructions of the cervical spine were also generated. RADIATION DOSE REDUCTION: This exam was performed according to the departmental dose-optimization program which includes automated exposure control, adjustment of the mA and/or kV according to patient size and/or use of iterative reconstruction technique. COMPARISON:  06/13/2021 CT head, no prior CT cervical spine available, correlation is made with 12/04/2016 MRI cervical spine FINDINGS: CT HEAD FINDINGS Brain: No evidence of acute infarct, hemorrhage, mass, mass effect, or midline shift. No hydrocephalus or extra-axial fluid collection. Age related cerebral volume loss. Vascular: No hyperdense vessel. Skull: Negative for fracture or focal lesion. Sinuses/Orbits: No acute finding. Other: The mastoid air cells are well aerated. CT CERVICAL SPINE FINDINGS Alignment: No traumatic listhesis. Reversal of the normal cervical lordosis with anterolisthesis of C4 on C5 and C7 on T1 and retrolisthesis of C5 on C6, which appears facet mediated. Skull base and vertebrae: No acute fracture or suspicious osseous lesion. Soft tissues and spinal canal: No prevertebral fluid or swelling. No visible canal hematoma. Disc levels: Degenerative changes in the cervical spine.Moderate spinal canal stenosis at C5-C6. Upper chest: No focal pulmonary opacity or pleural effusion. IMPRESSION: 1. No acute intracranial process. 2. No acute fracture or traumatic listhesis in the cervical spine. Electronically Signed   By: Wiliam Ke M.D.   On: 10/11/2023 13:34   CT Cervical Spine Wo Contrast Result Date: 10/11/2023 CLINICAL DATA:  Fall, arm and back pain, no  loss consciousness EXAM: CT HEAD WITHOUT CONTRAST CT CERVICAL SPINE WITHOUT CONTRAST TECHNIQUE: Multidetector CT imaging of the head and cervical spine was performed following the standard protocol without intravenous contrast. Multiplanar CT image reconstructions of the cervical spine were also generated. RADIATION DOSE REDUCTION: This exam was performed according to the departmental dose-optimization program which includes automated exposure control, adjustment of the mA and/or kV according to patient size and/or use of iterative reconstruction technique. COMPARISON:  06/13/2021 CT head, no prior CT cervical spine available, correlation is made with 12/04/2016 MRI cervical spine FINDINGS: CT HEAD FINDINGS Brain: No evidence of acute infarct, hemorrhage, mass, mass effect, or midline shift. No hydrocephalus or extra-axial fluid collection. Age related cerebral volume loss. Vascular: No hyperdense vessel. Skull: Negative for fracture or focal lesion. Sinuses/Orbits: No acute finding. Other: The mastoid air cells are well aerated. CT CERVICAL SPINE FINDINGS Alignment: No traumatic listhesis. Reversal of the normal cervical lordosis with anterolisthesis of C4 on C5 and C7 on T1 and retrolisthesis of C5 on C6, which appears facet mediated. Skull base and vertebrae: No acute fracture or suspicious osseous lesion. Soft tissues and spinal canal: No prevertebral fluid or swelling. No visible canal hematoma. Disc levels: Degenerative changes in the cervical spine.Moderate spinal canal stenosis at C5-C6. Upper chest: No focal pulmonary opacity or pleural effusion. IMPRESSION: 1. No acute intracranial process. 2. No acute fracture or  traumatic listhesis in the cervical spine. Electronically Signed   By: Wiliam Ke M.D.   On: 10/11/2023 13:34    Scheduled Meds:  amLODipine  2.5 mg Oral Daily   cyanocobalamin  1,000 mcg Oral Daily   docusate sodium  100 mg Oral BID   donepezil  5 mg Oral Daily   enoxaparin (LOVENOX)  injection  40 mg Subcutaneous QHS   escitalopram  10 mg Oral Daily   furosemide  20 mg Oral Daily   lidocaine  1 patch Transdermal Q24H   metoprolol succinate  50 mg Oral Daily   pantoprazole  40 mg Oral QAC breakfast   Continuous Infusions:   LOS: 0 days   Time spent: 35 miuntes   Anjanae Woehrle Estill Cotta, MD [facility]  If 7PM-7AM, please contact night-coverage [email or web address] 10/13/2023, 10:39 AM

## 2023-10-14 DIAGNOSIS — S22030A Wedge compression fracture of third thoracic vertebra, initial encounter for closed fracture: Secondary | ICD-10-CM | POA: Diagnosis not present

## 2023-10-14 DIAGNOSIS — W19XXXA Unspecified fall, initial encounter: Secondary | ICD-10-CM | POA: Diagnosis not present

## 2023-10-14 DIAGNOSIS — I10 Essential (primary) hypertension: Secondary | ICD-10-CM | POA: Diagnosis not present

## 2023-10-14 DIAGNOSIS — S42101A Fracture of unspecified part of scapula, right shoulder, initial encounter for closed fracture: Secondary | ICD-10-CM | POA: Diagnosis not present

## 2023-10-14 LAB — BASIC METABOLIC PANEL
Anion gap: 9 (ref 5–15)
BUN: 23 mg/dL (ref 8–23)
CO2: 23 mmol/L (ref 22–32)
Calcium: 8.1 mg/dL — ABNORMAL LOW (ref 8.9–10.3)
Chloride: 105 mmol/L (ref 98–111)
Creatinine, Ser: 0.63 mg/dL (ref 0.44–1.00)
GFR, Estimated: >60 mL/min (ref >60)
Glucose, Bld: 105 mg/dL — ABNORMAL HIGH (ref 70–99)
Potassium: 3.3 mmol/L — ABNORMAL LOW (ref 3.5–5.1)
Sodium: 137 mmol/L (ref 135–145)

## 2023-10-14 LAB — CBC
HCT: 35.9 % — ABNORMAL LOW (ref 36.0–46.0)
Hemoglobin: 11.4 g/dL — ABNORMAL LOW (ref 12.0–15.0)
MCH: 29.6 pg (ref 26.0–34.0)
MCHC: 31.8 g/dL (ref 30.0–36.0)
MCV: 93.2 fL (ref 80.0–100.0)
Platelets: 162 10*3/uL (ref 150–400)
RBC: 3.85 MIL/uL — ABNORMAL LOW (ref 3.87–5.11)
RDW: 13.3 % (ref 11.5–15.5)
WBC: 7.3 10*3/uL (ref 4.0–10.5)
nRBC: 0 % (ref 0.0–0.2)

## 2023-10-14 MED ORDER — METOPROLOL SUCCINATE ER 25 MG PO TB24
25.0000 mg | ORAL_TABLET | Freq: Every day | ORAL | Status: DC
  Administered 2023-10-15 – 2023-10-18 (×4): 25 mg via ORAL
  Filled 2023-10-14 (×4): qty 1

## 2023-10-14 MED ORDER — AMLODIPINE BESYLATE 10 MG PO TABS
10.0000 mg | ORAL_TABLET | Freq: Every day | ORAL | Status: DC
  Administered 2023-10-15 – 2023-10-18 (×4): 10 mg via ORAL
  Filled 2023-10-14 (×4): qty 1

## 2023-10-14 MED ORDER — VITAMIN D 25 MCG (1000 UNIT) PO TABS
5000.0000 [IU] | ORAL_TABLET | Freq: Every day | ORAL | Status: DC
  Administered 2023-10-14 – 2023-10-18 (×5): 5000 [IU] via ORAL
  Filled 2023-10-14 (×5): qty 5

## 2023-10-14 MED ORDER — OXYCODONE HCL 5 MG PO TABS: 5.0000 mg | ORAL_TABLET | ORAL | 0 refills | Status: DC | PRN

## 2023-10-14 MED ORDER — POTASSIUM CHLORIDE CRYS ER 20 MEQ PO TBCR
40.0000 meq | EXTENDED_RELEASE_TABLET | ORAL | Status: AC
  Administered 2023-10-14 (×2): 40 meq via ORAL
  Filled 2023-10-14 (×2): qty 2

## 2023-10-14 MED ORDER — LOSARTAN POTASSIUM 50 MG PO TABS
100.0000 mg | ORAL_TABLET | Freq: Every day | ORAL | Status: DC
  Administered 2023-10-14 – 2023-10-18 (×5): 100 mg via ORAL
  Filled 2023-10-14 (×5): qty 2

## 2023-10-14 NOTE — Progress Notes (Signed)
PROGRESS NOTE    Bailey [name]  DGU:[id number] DOB: 30-Nov-1940 DOA: 10/11/2023 PCP: Rodrigo Ran, MD   Brief Narrative:  Patient is 82 year old female with dementia, HTN, hyperlipidemia presented after unwitnessed fall resulting in right scapular fracture, T3 compression fracture.  Patient lives at home with her husband. Per EMS report, patient was getting out of the shower on the morning of admission when she fell, no loss of consciousness.  She was complaining of pain in her arm and upper back since the fall.  Patient could not recall the fall.   CT showed acute compression fracture of T3 with approximately 40% vertebral body height and 2 mm of retropulsion of the inferior endplate.  Acute fractures of bilateral bridging anterior osteophytes at T3-T4, mild compression fracture of the superior endplate of T4 with approximately 10% vertebral body height loss and 2 mm of retropulsion of the superior endplate.  Acute nondisplaced fracture of the posterior right 7th through 10th ribs at the costovertebral junction.  Acute comminuted fracture of the spine of the right scapula extending into the infraspinatus fossa and inferior angle.   Assessment & Plan:   Principal Problem:   Fall with injury Active Problems:   HTN (hypertension)   Hyperlipidemia   Closed right scapular fracture   Compression fracture of body of thoracic vertebra (HCC)  Fall with injury, closed right scapular body fracture -Orthopedics was consulted, per Dr. Roda Shutters, nonoperative treatment, shoulder sling for comfort.  May weight-bear and perform ROM of the shoulder as tolerated -Follow up in the office in about 3 weeks -Shoulder sling placed -Continue pain control, bowel regimen -PT OT recommended SNF.  TOC working on placement.   Compression fracture of T3, T4 -MRI thoracic spine showed wedge compression deformities at T3 and T4 with approximately 50% height loss at T3 and 25% at T4.  Diffuse T3 bone marrow edema and  superior T4 endplate edema, no retropulsion  -Complains of mid low back pain with movement and deep inspiration -Neurosurgery consulted- Dr. Danielle Dess.  Recommended conservative management, pain control, PT OT and outpatient follow-up.  Typically T3-T4, difficult to get kyphoplasty and does not need TLSO brace. -Continue pain control, conservative management, out pt f/u in 2-3 weeks    posterior right 7th through 10th ribs fracture -Continue as needed pain control -Encouraged incentive spirometry   HTN (hypertension) -BP still elevated.  Will further increase amlodipine to 10 mg and resume losartan.  Continue Toprol-XL but reduce dose due to bradycardia.  Continue as needed IV hydralazine and labetalol.  Sinus bradycardia: Patient asymptomatic.  Heart rate in 50s.  Will reduce Toprol-XL dose to half.   Normocytic anemia: H&H is stable.  Continue to monitor   GERD -Continue Protonix   Dementia and depression -Continue Aricept, Lexapro  Hypokalemia: Will replenish.  DVT prophylaxis: enoxaparin (LOVENOX) injection 40 mg Start: 10/11/23 2200 SCDs Start: 10/11/23 1626   Code Status: Limited: Do not attempt resuscitation (DNR) -DNR-LIMITED -Do Not Intubate/DNI   Family Communication: Husband present at bedside.  Plan of care discussed with patient in length and he/she verbalized understanding and agreed with it.  Status is: Inpatient Remains inpatient appropriate because: Medically stable, pending placement.   Estimated body mass index is 19.05 kg/m as calculated from the following:   Height as of this encounter: 5' 2.5" (1.588 m).   Weight as of this encounter: 48 kg.    Nutritional Assessment: Body mass index is 19.05 kg/m.Marland Kitchen Seen by dietician.  I agree with the assessment and plan  as outlined below: Nutrition Status:        . Skin Assessment: I have examined the patient's skin and I agree with the wound assessment as performed by the wound care RN as outlined below:     Consultants:  Orthopedics and neurosurgery  Procedures:  As above  Antimicrobials:  Anti-infectives (From admission, onward)    None         Subjective: Patient seen and examined.  Husband at the bedside.  Patient is fully alert and oriented to self but she did not know that she was in the hospital.  She missed the year and the month as well.  She had no complaints.  Looked comfortable.  Per husband, there are times when she does not know the answers to those questions.  Based on that, she appears to be at her baseline.  Objective: Vitals:   10/13/23 2052 10/13/23 2106 10/13/23 2222 10/14/23 0610  BP: (!) 193/69 (!) 183/67 (!) 160/69 (!) 170/62  Pulse: (!) 53 (!) 53 (!) 57 (!) 52  Resp: 18   18  Temp: 98 F (36.7 C)   98.6 F (37 C)  TempSrc: Oral   Oral  SpO2: 98%   97%  Weight:      Height:        Intake/Output Summary (Last 24 hours) at 10/14/2023 0845 Last data filed at 10/14/2023 0200 Gross per 24 hour  Intake 180 ml  Output 400 ml  Net -220 ml   Filed Weights   10/11/23 1101  Weight: 48 kg    Examination:  General exam: Appears calm and comfortable  Respiratory system: Clear to auscultation. Respiratory effort normal. Cardiovascular system: S1 & S2 heard, RRR. No JVD, murmurs, rubs, gallops or clicks. No pedal edema. Gastrointestinal system: Abdomen is nondistended, soft and nontender. No organomegaly or masses felt. Normal bowel sounds heard. Central nervous system: Alert and oriented x 1. No focal neurological deficits. Extremities: Symmetric 5 x 5 power. Skin: No rashes, lesions or ulcers   Data Reviewed: I have personally reviewed following labs and imaging studies  CBC: Recent Labs  Lab 10/11/23 1245 10/12/23 0428 10/14/23 0529  WBC 11.3* 8.1 7.3  NEUTROABS 9.4*  --   --   HGB 11.7* 11.0* 11.4*  HCT 35.0* 35.0* 35.9*  MCV 93.1 95.1 93.2  PLT 145* 172 162   Basic Metabolic Panel: Recent Labs  Lab 10/11/23 1245 10/12/23 0428  10/14/23 0529  NA 139 136 137  K 3.2* 4.0 3.3*  CL 107 105 105  CO2 23 25 23   GLUCOSE 105* 99 105*  BUN 23 23 23   CREATININE 0.83 0.81 0.63  CALCIUM 8.4* 7.7* 8.1*   GFR: Estimated Creatinine Clearance: 41.8 mL/min (by C-G formula based on SCr of 0.63 mg/dL). Liver Function Tests: No results for input(s): "AST", "ALT", "ALKPHOS", "BILITOT", "PROT", "ALBUMIN" in the last 168 hours. No results for input(s): "LIPASE", "AMYLASE" in the last 168 hours. No results for input(s): "AMMONIA" in the last 168 hours. Coagulation Profile: No results for input(s): "INR", "PROTIME" in the last 168 hours. Cardiac Enzymes: No results for input(s): "CKTOTAL", "CKMB", "CKMBINDEX", "TROPONINI" in the last 168 hours. BNP (last 3 results) No results for input(s): "PROBNP" in the last 8760 hours. HbA1C: No results for input(s): "HGBA1C" in the last 72 hours. CBG: No results for input(s): "GLUCAP" in the last 168 hours. Lipid Profile: No results for input(s): "CHOL", "HDL", "LDLCALC", "TRIG", "CHOLHDL", "LDLDIRECT" in the last 72 hours. Thyroid Function Tests: No  results for input(s): "TSH", "T4TOTAL", "FREET4", "T3FREE", "THYROIDAB" in the last 72 hours. Anemia Panel: No results for input(s): "VITAMINB12", "FOLATE", "FERRITIN", "TIBC", "IRON", "RETICCTPCT" in the last 72 hours. Sepsis Labs: No results for input(s): "PROCALCITON", "LATICACIDVEN" in the last 168 hours.  No results found for this or any previous visit (from the past 240 hours).   Radiology Studies: No results found.  Scheduled Meds:  [START ON 10/15/2023] amLODipine  10 mg Oral Daily   cyanocobalamin  1,000 mcg Oral Daily   docusate sodium  100 mg Oral BID   donepezil  5 mg Oral Daily   enoxaparin (LOVENOX) injection  40 mg Subcutaneous QHS   escitalopram  10 mg Oral Daily   furosemide  20 mg Oral Daily   lidocaine  1 patch Transdermal Q24H   losartan  100 mg Oral Daily   metoprolol succinate  50 mg Oral Daily   pantoprazole   40 mg Oral QAC breakfast   potassium chloride  40 mEq Oral Q4H   Vitamin D3  5,000 Units Oral Daily   Continuous Infusions:   LOS: 1 day   Hughie Closs, MD [facility]  10/14/2023, 8:45 AM   *Please note that this is a verbal dictation therefore any spelling or grammatical errors are due to the "Dragon [employer] One" system interpretation.  Please page via Amion and do not message via secure chat for urgent patient care matters. Secure chat can be used for non urgent patient care matters.  How to contact the [facility] Attending or Consulting provider 7A - 7P or covering provider during after hours 7P -7A, for this patient?  Check the care team in [facility] and look for a) attending/consulting TRH provider listed and b) the [facility] team listed. Page or secure chat 7A-7P. Log into [email or web address] and use [facility] universal password to access. If you do not have the password, please contact the hospital operator. Locate the [facility] provider you are looking for under [facility] and page to a number that you can be directly reached. If you still have difficulty reaching the provider, please page the [facility] (Director on Call) for the Hospitalists listed on amion for assistance.

## 2023-10-14 NOTE — Progress Notes (Signed)
Physical Therapy Treatment Patient Details Name: Bailey [name] MRN: [id number] DOB: [birth year] Today's Date: 10/14/2023   History of Present Illness Patient is 82 year old female with dementia, HTN, hyperlipidemia presented after unwitnessed fall resulting in right scapular fracture, T3 compression fracture    PT Comments  Pt progressing toward PT goals. Pt cooperative but required more encouragement to mobilize today. Amb with HHA needing min assist to steady throughout, and prevent LOB d/t pt delayed reactions and decr safety awareness. D/c plan remains appropriate.   If plan is discharge home, recommend the following: A little help with walking and/or transfers;A little help with bathing/dressing/bathroom;Assistance with cooking/housework;Direct supervision/assist for financial management;Supervision due to cognitive status;Help with stairs or ramp for entrance;Assist for transportation   Can travel by private vehicle     Yes  Equipment Recommendations  None recommended by PT (defer to next venue)    Recommendations for Other Services       Precautions / Restrictions Precautions Precautions: Fall Precaution Comments: sling R UE-for comfort, may have sling off, ROM R  shoulder as tolerated Required Braces or Orthoses: Sling Restrictions Weight Bearing Restrictions Per Provider Order: No RUE Weight Bearing Per Provider Order: Weight bearing as tolerated     Mobility  Bed Mobility Overal bed mobility: Needs Assistance Bed Mobility: Supine to Sit, Sit to Supine     Supine to sit: Contact guard, Supervision Sit to supine: Contact guard assist, Supervision   General bed mobility comments: for safety, cues to complete task and self assist    Transfers   Equipment used: 1 person hand held assist Transfers: Sit to/from Stand Sit to Stand: Min assist           General transfer comment: assist to rise and steadyonce in standing    Ambulation/Gait Ambulation/Gait  assistance: Min assist Gait Distance (Feet): 84 Feet Assistive device: 1 person hand held assist Gait Pattern/deviations: Step-through pattern, Narrow base of support, Decreased stride length       General Gait Details: assist to steady throughout distance, LOB x1 with min assist to recover. decr awareness of balance deficits and safety   Stairs             Wheelchair Mobility     Tilt Bed    Modified Rankin (Stroke Patients Only)       Balance     Sitting balance-Leahy Scale: Good     Standing balance support: During functional activity, Single extremity supported Standing balance-Leahy Scale: Poor Standing balance comment: assist needed to for static stand, incr postural sway noted                            Cognition Arousal: Alert Behavior During Therapy: Flat affect Overall Cognitive Status: History of cognitive impairments - at baseline                                 General Comments: impaired safety awarreness, STM deficits. Pt cannot recall falling ll or why RUE is painful; repeats self and has no immediate recall.        Exercises General Exercises - Lower Extremity Ankle Circles/Pumps: AROM, Both, 10 reps Quad Sets: AROM, Both, 5 reps Heel Slides: AROM, Both, 10 reps    General Comments        Pertinent Vitals/Pain Pain Assessment Pain Assessment: Faces Faces Pain Scale: Hurts a little bit Pain Location: R UE  Pain Descriptors / Indicators: Aching, Sore Pain Intervention(s): Monitored during session, Repositioned    Home Living                          Prior Function            PT Goals (current goals can now be found in the care plan section) Acute Rehab PT Goals Patient Stated Goal: rehab PT Goal Formulation: With patient/family Time For Goal Achievement: 10/26/23 Potential to Achieve Goals: Good Progress towards PT goals: Progressing toward goals    Frequency    Min 1X/week       PT Plan      Co-evaluation              AM-PAC PT "6 Clicks" Mobility   Outcome Measure  Help needed turning from your back to your side while in a flat bed without using bedrails?: A Little Help needed moving from lying on your back to sitting on the side of a flat bed without using bedrails?: A Little Help needed moving to and from a bed to a chair (including a wheelchair)?: A Little Help needed standing up from a chair using your arms (e.g., wheelchair or bedside chair)?: A Little Help needed to walk in hospital room?: A Little Help needed climbing 3-5 steps with a railing? : A Little 6 Click Score: 18    End of Session Equipment Utilized During Treatment: Gait belt Activity Tolerance: Patient tolerated treatment well Patient left: with call bell/phone within reach;in bed;with family/visitor present;with bed alarm set   PT Visit Diagnosis: Other abnormalities of gait and mobility (R26.89);Difficulty in walking, not elsewhere classified (R26.2)     Time: 1610-9604 PT Time Calculation (min) (ACUTE ONLY): 13 min  Charges:    $Gait Training: 8-22 mins PT General Charges $$ ACUTE PT VISIT: 1 Visit                     Margery Szostak, PT  Acute Rehab Dept [facility]) [phone]  10/14/2023    [facility] 10/14/2023, 10:54 AM

## 2023-10-14 NOTE — Plan of Care (Signed)
  Problem: Education: Goal: Knowledge of General Education information will improve Description Including pain rating scale, medication(s)/side effects and non-pharmacologic comfort measures Outcome: Progressing   Problem: Health Behavior/Discharge Planning: Goal: Ability to manage health-related needs will improve Outcome: Progressing   

## 2023-10-15 DIAGNOSIS — W19XXXA Unspecified fall, initial encounter: Secondary | ICD-10-CM | POA: Diagnosis not present

## 2023-10-15 DIAGNOSIS — I10 Essential (primary) hypertension: Secondary | ICD-10-CM | POA: Diagnosis not present

## 2023-10-15 LAB — BASIC METABOLIC PANEL
Anion gap: 8 (ref 5–15)
BUN: 24 mg/dL — ABNORMAL HIGH (ref 8–23)
CO2: 22 mmol/L (ref 22–32)
Calcium: 8.3 mg/dL — ABNORMAL LOW (ref 8.9–10.3)
Chloride: 106 mmol/L (ref 98–111)
Creatinine, Ser: 0.8 mg/dL (ref 0.44–1.00)
GFR, Estimated: >60 mL/min (ref >60)
Glucose, Bld: 92 mg/dL (ref 70–99)
Potassium: 4.3 mmol/L (ref 3.5–5.1)
Sodium: 136 mmol/L (ref 135–145)

## 2023-10-15 NOTE — TOC Progression Note (Signed)
Transition of Care [facility]) - Progression Note   Patient Details  Name: CYPRESS LEIS MRN: [id number] Date of Birth: 1941-10-19  [facility]) CM/SW Contact  Ewing Schlein, LCSW Phone Number: 10/15/2023, 1:21 PM  Clinical Narrative: PT evaluation recommended SNF. Daughter agreeable to CSW starting bed search. FL2 done; PASRR received. Initial referral faxed out. TOC awaiting bed offers.  Expected Discharge Plan: Skilled Nursing Facility Barriers to Discharge: SNF Pending bed offer, Insurance Authorization  Expected Discharge Plan and Services In-house Referral: Clinical Social Work Post Acute Care Choice: Skilled Nursing Facility Living arrangements for the past 2 months: Single Family Home           DME Arranged: N/A DME Agency: NA  Social Determinants of Health (SDOH) Interventions SDOH Screenings   Food Insecurity: Patient Unable To Answer (10/12/2023)  Housing: Patient Unable To Answer (10/12/2023)  Transportation Needs: Patient Unable To Answer (10/12/2023)  Utilities: Patient Unable To Answer (10/12/2023)  Depression (PHQ2-9): Low Risk  (10/10/2018)  Tobacco Use: Low Risk  (10/11/2023)   Readmission Risk Interventions     No data to display

## 2023-10-15 NOTE — Progress Notes (Signed)
PROGRESS NOTE    Bailey [name]  WUJ:[id number] DOB: 12-03-1940 DOA: 10/11/2023 PCP: Rodrigo Ran, MD   Brief Narrative:  Patient is 82 year old female with dementia, HTN, hyperlipidemia presented after unwitnessed fall resulting in right scapular fracture, T3 compression fracture.  Patient lives at home with her husband. Per EMS report, patient was getting out of the shower on the morning of admission when she fell, no loss of consciousness.  She was complaining of pain in her arm and upper back since the fall.  Patient could not recall the fall.   CT showed acute compression fracture of T3 with approximately 40% vertebral body height and 2 mm of retropulsion of the inferior endplate.  Acute fractures of bilateral bridging anterior osteophytes at T3-T4, mild compression fracture of the superior endplate of T4 with approximately 10% vertebral body height loss and 2 mm of retropulsion of the superior endplate.  Acute nondisplaced fracture of the posterior right 7th through 10th ribs at the costovertebral junction.  Acute comminuted fracture of the spine of the right scapula extending into the infraspinatus fossa and inferior angle.   Assessment & Plan:   Principal Problem:   Fall with injury Active Problems:   HTN (hypertension)   Hyperlipidemia   Closed right scapular fracture   Compression fracture of body of thoracic vertebra (HCC)  Fall with injury, closed right scapular body fracture -Orthopedics was consulted, per Dr. Roda Shutters, nonoperative treatment, shoulder sling for comfort.  May weight-bear and perform ROM of the shoulder as tolerated -Follow up in the office in about 3 weeks -Shoulder sling placed -Continue pain control, bowel regimen -PT OT recommended SNF.  TOC working on placement.   Compression fracture of T3, T4 -MRI thoracic spine showed wedge compression deformities at T3 and T4 with approximately 50% height loss at T3 and 25% at T4.  Diffuse T3 bone marrow edema and  superior T4 endplate edema, no retropulsion  -Complains of mid low back pain with movement and deep inspiration -Neurosurgery consulted- Dr. Danielle Dess.  Recommended conservative management, pain control, PT OT and outpatient follow-up.  Typically T3-T4, difficult to get kyphoplasty and does not need TLSO brace. -Continue pain control, conservative management, out pt f/u in 2-3 weeks    posterior right 7th through 10th ribs fracture -Continue as needed pain control -Encouraged incentive spirometry   HTN (hypertension) -BP still elevated but received a.m. medications very late today.  Continue current dose of amlodipine and losartan.  Continue Toprol-XL at reduced dose due to bradycardia.  Continue as needed IV hydralazine and labetalol.  Sinus bradycardia: Patient asymptomatic.  Heart rate in 50s.  Will reduce Toprol-XL dose to half.   Normocytic anemia: H&H is stable.  Continue to monitor   GERD -Continue Protonix   Dementia and depression -Continue Aricept, Lexapro  Hypokalemia: Resolved.  DVT prophylaxis: enoxaparin (LOVENOX) injection 40 mg Start: 10/11/23 2200 SCDs Start: 10/11/23 1626   Code Status: Limited: Do not attempt resuscitation (DNR) -DNR-LIMITED -Do Not Intubate/DNI   Family Communication: Husband present at bedside.  Plan of care discussed with husband.  Status is: Inpatient Remains inpatient appropriate because: Medically stable, pending placement.   Estimated body mass index is 19.05 kg/m as calculated from the following:   Height as of this encounter: 5' 2.5" (1.588 m).   Weight as of this encounter: 48 kg.    Nutritional Assessment: Body mass index is 19.05 kg/m.Marland Kitchen Seen by dietician.  I agree with the assessment and plan as outlined below: Nutrition Status:        .  Skin Assessment: I have examined the patient's skin and I agree with the wound assessment as performed by the wound care RN as outlined below:    Consultants:  Orthopedics and  neurosurgery  Procedures:  As above  Antimicrobials:  Anti-infectives (From admission, onward)    None         Subjective: Seen and examined.  Sitting at the edge, eating her breakfast.  Husband at the bedside.  Patient was feeling much better and appearing much better than yesterday.  Smiling, talking, more coherent, at her baseline.  Objective: Vitals:   10/14/23 1405 10/14/23 1518 10/14/23 2041 10/15/23 0624  BP: (!) 170/64 (!) 155/64 (!) 144/72 (!) 163/79  Pulse: (!) 56 (!) 55 (!) 59 (!) 53  Resp: 20  15 15   Temp: (!) 97.3 F (36.3 C)  98 F (36.7 C) 97.9 F (36.6 C)  TempSrc:   Oral Oral  SpO2: 98%  99% 99%  Weight:      Height:        Intake/Output Summary (Last 24 hours) at 10/15/2023 1252 Last data filed at 10/15/2023 1124 Gross per 24 hour  Intake 600 ml  Output 250 ml  Net 350 ml   Filed Weights   10/11/23 1101  Weight: 48 kg    Examination:  General exam: Appears calm and comfortable  Respiratory system: Clear to auscultation. Respiratory effort normal. Cardiovascular system: S1 & S2 heard, RRR. No JVD, murmurs, rubs, gallops or clicks. No pedal edema. Gastrointestinal system: Abdomen is nondistended, soft and nontender. No organomegaly or masses felt. Normal bowel sounds heard. Central nervous system: Alert and oriented x 1. No focal neurological deficits. Extremities: Symmetric 5 x 5 power. Skin: No rashes, lesions or ulcers.   Data Reviewed: I have personally reviewed following labs and imaging studies  CBC: Recent Labs  Lab 10/11/23 1245 10/12/23 0428 10/14/23 0529  WBC 11.3* 8.1 7.3  NEUTROABS 9.4*  --   --   HGB 11.7* 11.0* 11.4*  HCT 35.0* 35.0* 35.9*  MCV 93.1 95.1 93.2  PLT 145* 172 162   Basic Metabolic Panel: Recent Labs  Lab 10/11/23 1245 10/12/23 0428 10/14/23 0529 10/15/23 0449  NA 139 136 137 136  K 3.2* 4.0 3.3* 4.3  CL 107 105 105 106  CO2 23 25 23 22   GLUCOSE 105* 99 105* 92  BUN 23 23 23  24*  CREATININE  0.83 0.81 0.63 0.80  CALCIUM 8.4* 7.7* 8.1* 8.3*   GFR: Estimated Creatinine Clearance: 41.8 mL/min (by C-G formula based on SCr of 0.8 mg/dL). Liver Function Tests: No results for input(s): "AST", "ALT", "ALKPHOS", "BILITOT", "PROT", "ALBUMIN" in the last 168 hours. No results for input(s): "LIPASE", "AMYLASE" in the last 168 hours. No results for input(s): "AMMONIA" in the last 168 hours. Coagulation Profile: No results for input(s): "INR", "PROTIME" in the last 168 hours. Cardiac Enzymes: No results for input(s): "CKTOTAL", "CKMB", "CKMBINDEX", "TROPONINI" in the last 168 hours. BNP (last 3 results) No results for input(s): "PROBNP" in the last 8760 hours. HbA1C: No results for input(s): "HGBA1C" in the last 72 hours. CBG: No results for input(s): "GLUCAP" in the last 168 hours. Lipid Profile: No results for input(s): "CHOL", "HDL", "LDLCALC", "TRIG", "CHOLHDL", "LDLDIRECT" in the last 72 hours. Thyroid Function Tests: No results for input(s): "TSH", "T4TOTAL", "FREET4", "T3FREE", "THYROIDAB" in the last 72 hours. Anemia Panel: No results for input(s): "VITAMINB12", "FOLATE", "FERRITIN", "TIBC", "IRON", "RETICCTPCT" in the last 72 hours. Sepsis Labs: No results for input(s): "PROCALCITON", "  LATICACIDVEN" in the last 168 hours.  No results found for this or any previous visit (from the past 240 hours).   Radiology Studies: No results found.  Scheduled Meds:  amLODipine  10 mg Oral Daily   cholecalciferol  5,000 Units Oral Daily   cyanocobalamin  1,000 mcg Oral Daily   docusate sodium  100 mg Oral BID   donepezil  5 mg Oral Daily   enoxaparin (LOVENOX) injection  40 mg Subcutaneous QHS   escitalopram  10 mg Oral Daily   furosemide  20 mg Oral Daily   lidocaine  1 patch Transdermal Q24H   losartan  100 mg Oral Daily   metoprolol succinate  25 mg Oral Daily   pantoprazole  40 mg Oral QAC breakfast   Continuous Infusions:   LOS: 2 days   Hughie Closs, MD [facility]  10/15/2023, 12:52 PM   *Please note that this is a verbal dictation therefore any spelling or grammatical errors are due to the "Dragon Medical One" system interpretation.  Please page via Amion and do not message via secure chat for urgent patient care matters. Secure chat can be used for non urgent patient care matters.  How to contact the [facility] Attending or Consulting provider 7A - 7P or covering provider during after hours 7P -7A, for this patient?  Check the care team in [facility] and look for a) attending/consulting TRH provider listed and b) the [facility] team listed. Page or secure chat 7A-7P. Log into [email or web address] and use [place]'s universal password to access. If you do not have the password, please contact the hospital operator. Locate the [facility] provider you are looking for under [facility] and page to a number that you can be directly reached. If you still have difficulty reaching the provider, please page the [facility] (Director on Call) for the Hospitalists listed on amion for assistance.

## 2023-10-15 NOTE — [place] FL2 (Signed)
[place] MEDICAID FL2 LEVEL OF CARE FORM     IDENTIFICATION  Patient Name: Bailey [name] Birthdate: Oct 18, 1941 Sex: female Admission Date (Current Location): 10/11/2023  [facility] and [place] Number:  Producer, television/film/video and Address:  [facility],  [address] [place], [place] [address]      Provider Number: [id number]  Attending Physician Name and Address:  Hughie Closs, MD  Relative Name and Phone Number:  Japleen Ceja (spouse) Ph: [phone]    Current Level of Care: Hospital Recommended Level of Care: Skilled Nursing Facility Prior Approval Number:    Date Approved/Denied:   PASRR Number: [id number]  Discharge Plan: SNF    Current Diagnoses: Patient Active Problem List   Diagnosis Date Noted   Closed right scapular fracture 10/12/2023   Compression fracture of body of thoracic vertebra (HCC) 10/12/2023   Fall with injury 10/11/2023   Aortic insufficiency 09/06/2017   Peripheral neuropathy 08/15/2017   Tinnitus 05/18/2014   Pleomorphic adenoma of minor salivary gland 01/13/2014   Diarrhea 01/06/2013   Nausea with vomiting 01/06/2013   Abdominal pain, right upper quadrant 01/06/2013   Palpitations 01/06/2013   Bright red blood per rectum 01/06/2013   Dehydration 01/06/2013   Hypokalemia 01/06/2013   Hyponatremia 01/06/2013   Acute renal failure (HCC) 01/06/2013   Migraine 03/14/2012   Dizziness 03/14/2012   Bicuspid aortic valve 02/27/2012   HTN (hypertension) 02/27/2012   Hyperlipidemia 02/27/2012    Orientation RESPIRATION BLADDER Height & Weight     Self  Normal Incontinent Weight: 105 lb 13.1 oz (48 kg) Height:  5' 2.5" (158.8 cm)  BEHAVIORAL SYMPTOMS/MOOD NEUROLOGICAL BOWEL NUTRITION STATUS      Incontinent Diet (Regular diet)  AMBULATORY STATUS COMMUNICATION OF NEEDS Skin   Limited Assist Verbally Other (Comment) (Ecchymosis: arm; Erythema: buttocks)                       Personal Care Assistance Level of Assistance   Bathing, Feeding, Dressing Bathing Assistance: Limited assistance Feeding assistance: Independent Dressing Assistance: Limited assistance     Functional Limitations Info  Sight, Hearing, Speech Sight Info: Adequate Hearing Info: Adequate Speech Info: Adequate    SPECIAL CARE FACTORS FREQUENCY  PT (By licensed PT), OT (By licensed OT)     PT Frequency: 5x's/week OT Frequency: 5x's/week            Contractures Contractures Info: Not present    Additional Factors Info  Code Status, Allergies, Psychotropic Code Status Info: DNR Allergies Info: Alendronate Sodium, Amlodipine, Aspirin, Cozaar (Losartan), Exelon (Rivastigmine), Fexofenadine, Hydrochlorothiazide, Ibuprofen, Ibuprofen  Lisinopril, Loratadine, Metronidazole, Olmesartan, Ondansetron, Pantoprazole Sodium, Simvastatin Psychotropic Info: See discharge summary         Current Medications (10/15/2023):  This is the current hospital active medication list Current Facility-Administered Medications  Medication Dose Route Frequency Provider Last Rate Last Admin   acetaminophen (TYLENOL) tablet 650 mg  650 mg Oral Q6H PRN Kirby Crigler, Mir M, MD   650 mg at 10/15/23 0110   Or   acetaminophen (TYLENOL) suppository 650 mg  650 mg Rectal Q6H PRN Kirby Crigler, Mir M, MD       albuterol (PROVENTIL) (2.5 MG/3ML) 0.083% nebulizer solution 2.5 mg  2.5 mg Nebulization Q2H PRN Kirby Crigler, Mir M, MD       amLODipine (NORVASC) tablet 10 mg  10 mg Oral Daily Pahwani, Daleen Bo, MD       cholecalciferol (VITAMIN D3) 25 MCG (1000 UNIT) tablet 5,000 Units  5,000 Units Oral  Daily Hughie Closs, MD   5,000 Units at 10/14/23 1050   cyanocobalamin (VITAMIN B12) tablet 1,000 mcg  1,000 mcg Oral Daily Rai, Ripudeep K, MD   1,000 mcg at 10/14/23 0840   docusate sodium (COLACE) capsule 100 mg  100 mg Oral BID Kirby Crigler, Mir M, MD   100 mg at 10/14/23 2252   donepezil (ARICEPT) tablet 5 mg  5 mg Oral Daily Rai, Ripudeep K, MD   5 mg at 10/14/23 0841    enoxaparin (LOVENOX) injection 40 mg  40 mg Subcutaneous QHS Kirby Crigler, Mir M, MD   40 mg at 10/13/23 2101   escitalopram (LEXAPRO) tablet 10 mg  10 mg Oral Daily Rai, Ripudeep K, MD   10 mg at 10/14/23 0840   fentaNYL (SUBLIMAZE) injection 12.5-50 mcg  12.5-50 mcg Intravenous Q2H PRN Kirby Crigler, Mir M, MD       furosemide (LASIX) tablet 20 mg  20 mg Oral Daily Kirby Crigler, Mir M, MD   20 mg at 10/14/23 0841   haloperidol lactate (HALDOL) injection 5 mg  5 mg Intravenous Q6H PRN Pahwani, Rinka R, MD   5 mg at 10/13/23 2257   hydrALAZINE (APRESOLINE) injection 10 mg  10 mg Intravenous Q6H PRN Kirby Crigler, Mir M, MD   10 mg at 10/14/23 4403   labetalol (NORMODYNE) injection 10 mg  10 mg Intravenous Q4H PRN Kirby Crigler, Mir M, MD   10 mg at 10/13/23 2112   lidocaine (LIDODERM) 5 % 1 patch  1 patch Transdermal Q24H Rai, Ripudeep K, MD   1 patch at 10/14/23 1223   losartan (COZAAR) tablet 100 mg  100 mg Oral Daily Pahwani, Daleen Bo, MD   100 mg at 10/14/23 1223   metoprolol succinate (TOPROL-XL) 24 hr tablet 25 mg  25 mg Oral Daily Pahwani, Daleen Bo, MD       oxyCODONE (Oxy IR/ROXICODONE) immediate release tablet 5 mg  5 mg Oral Q4H PRN Kirby Crigler, Mir M, MD   5 mg at 10/13/23 2257   pantoprazole (PROTONIX) EC tablet 40 mg  40 mg Oral QAC breakfast Rai, Ripudeep K, MD   40 mg at 10/14/23 0840   polyethylene glycol (MIRALAX / GLYCOLAX) packet 17 g  17 g Oral Daily PRN Kirby Crigler, Mir M, MD       promethazine (PHENERGAN) tablet 12.5 mg  12.5 mg Oral Q6H PRN Kirby Crigler, Mir M, MD   12.5 mg at 10/11/23 1903     Discharge Medications: Please see discharge summary for a list of discharge medications.  Relevant Imaging Results:  Relevant Lab Results:   Additional Information SSN: [id number]  Ewing Schlein, LCSW

## 2023-10-15 NOTE — Progress Notes (Signed)
Mobility Specialist - Progress Note   10/15/23 0940  Mobility  Activity Ambulated with assistance in hallway  Level of Assistance Standby assist, set-up cues, supervision of patient - no hands on  Assistive Device Front wheel walker  Distance Ambulated (ft) 230 ft  RUE Weight Bearing Per Provider Order WBAT  Activity Response Tolerated well  Mobility Referral Yes  Mobility visit 1 Mobility  Mobility Specialist Start Time (ACUTE ONLY) [id number]  Mobility Specialist Stop Time (ACUTE ONLY) 0939  Mobility Specialist Time Calculation (min) (ACUTE ONLY) 13 min   Pt received in bed and agreeable to mobility. C/o arm & back pain during session. Pt to bed after session with all needs met. Bed alarm on.   [facility]

## 2023-10-16 DIAGNOSIS — I10 Essential (primary) hypertension: Secondary | ICD-10-CM | POA: Diagnosis not present

## 2023-10-16 DIAGNOSIS — W19XXXA Unspecified fall, initial encounter: Secondary | ICD-10-CM | POA: Diagnosis not present

## 2023-10-16 MED ORDER — HYDRALAZINE HCL 25 MG PO TABS
25.0000 mg | ORAL_TABLET | Freq: Three times a day (TID) | ORAL | Status: DC
  Administered 2023-10-16 – 2023-10-18 (×7): 25 mg via ORAL
  Filled 2023-10-16 (×7): qty 1

## 2023-10-16 NOTE — Progress Notes (Signed)
PROGRESS NOTE    Bailey [name]  YNW:[id number] DOB: 1940-11-01 DOA: 10/11/2023 PCP: Rodrigo Ran, MD   Brief Narrative:  Patient is 82 year old female with dementia, HTN, hyperlipidemia presented after unwitnessed fall resulting in right scapular fracture, T3 compression fracture.  Patient lives at home with her husband. Per EMS report, patient was getting out of the shower on the morning of admission when she fell, no loss of consciousness.  She was complaining of pain in her arm and upper back since the fall.  Patient could not recall the fall.   CT showed acute compression fracture of T3 with approximately 40% vertebral body height and 2 mm of retropulsion of the inferior endplate.  Acute fractures of bilateral bridging anterior osteophytes at T3-T4, mild compression fracture of the superior endplate of T4 with approximately 10% vertebral body height loss and 2 mm of retropulsion of the superior endplate.  Acute nondisplaced fracture of the posterior right 7th through 10th ribs at the costovertebral junction.  Acute comminuted fracture of the spine of the right scapula extending into the infraspinatus fossa and inferior angle.   Assessment & Plan:   Principal Problem:   Fall with injury Active Problems:   HTN (hypertension)   Hyperlipidemia   Closed right scapular fracture   Compression fracture of body of thoracic vertebra (HCC)  Fall with injury, closed right scapular body fracture -Orthopedics was consulted, per Dr. Roda Shutters, nonoperative treatment, shoulder sling for comfort.  May weight-bear and perform ROM of the shoulder as tolerated -Follow up in the office in about 3 weeks -Shoulder sling placed -Continue pain control, bowel regimen -PT OT recommended SNF.  TOC working on placement.   Compression fracture of T3, T4 -MRI thoracic spine showed wedge compression deformities at T3 and T4 with approximately 50% height loss at T3 and 25% at T4.  Diffuse T3 bone marrow edema and  superior T4 endplate edema, no retropulsion  -Complains of mid low back pain with movement and deep inspiration -Neurosurgery consulted- Dr. Danielle Dess.  Recommended conservative management, pain control, PT OT and outpatient follow-up.  Typically T3-T4, difficult to get kyphoplasty and does not need TLSO brace. -Continue pain control, conservative management, out pt f/u in 2-3 weeks    posterior right 7th through 10th ribs fracture -Continue as needed pain control -Encouraged incentive spirometry   HTN (hypertension) -BP still elevated but received a.m. medications very late today.  Continue current dose of amlodipine and losartan.  Continue Toprol-XL at reduced dose due to bradycardia.  Continue as needed IV hydralazine and labetalol.  Sinus bradycardia: Patient asymptomatic.  Heart rate in 50s.  Will reduce Toprol-XL dose to half.   Normocytic anemia: H&H is stable.  Continue to monitor   GERD -Continue Protonix   Dementia and depression -Continue Aricept, Lexapro  Hypokalemia: Resolved.  DVT prophylaxis: enoxaparin (LOVENOX) injection 40 mg Start: 10/11/23 2200 SCDs Start: 10/11/23 1626   Code Status: Limited: Do not attempt resuscitation (DNR) -DNR-LIMITED -Do Not Intubate/DNI   Family Communication: Husband present at bedside.  Plan of care discussed with husband.  Status is: Inpatient Remains inpatient appropriate because: Medically stable, pending placement.   Estimated body mass index is 19.05 kg/m as calculated from the following:   Height as of this encounter: 5' 2.5" (1.588 m).   Weight as of this encounter: 48 kg.    Nutritional Assessment: Body mass index is 19.05 kg/m.Marland Kitchen Seen by dietician.  I agree with the assessment and plan as outlined below: Nutrition Status:        .  Skin Assessment: I have examined the patient's skin and I agree with the wound assessment as performed by the wound care RN as outlined below:    Consultants:  Orthopedics and  neurosurgery  Procedures:  As above  Antimicrobials:  Anti-infectives (From admission, onward)    None         Subjective: Patient seen and examined.  She has no complaints.  Husband at the bedside.  Objective: Vitals:   10/15/23 0624 10/15/23 1300 10/15/23 2137 10/16/23 0651  BP: (!) 163/79 (!) 178/70 (!) 192/84 (!) 183/81  Pulse: (!) 53 60 64 67  Resp: 15 16 17 18   Temp: 97.9 F (36.6 C) 97.6 F (36.4 C) 97.6 F (36.4 C) 98.5 F (36.9 C)  TempSrc: Oral Oral Oral Oral  SpO2: 99% 99% 95% 97%  Weight:      Height:        Intake/Output Summary (Last 24 hours) at 10/16/2023 1204 Last data filed at 10/16/2023 1308 Gross per 24 hour  Intake 720 ml  Output --  Net 720 ml   Filed Weights   10/11/23 1101  Weight: 48 kg    Examination:  General exam: Appears calm and comfortable  Respiratory system: Clear to auscultation. Respiratory effort normal. Cardiovascular system: S1 & S2 heard, RRR. No JVD, murmurs, rubs, gallops or clicks. No pedal edema. Gastrointestinal system: Abdomen is nondistended, soft and nontender. No organomegaly or masses felt. Normal bowel sounds heard. Central nervous system: Alert and oriented x 1. No focal neurological deficits. Extremities: Symmetric 5 x 5 power. Skin: No rashes, lesions or ulcers.   Data Reviewed: I have personally reviewed following labs and imaging studies  CBC: Recent Labs  Lab 10/11/23 1245 10/12/23 0428 10/14/23 0529  WBC 11.3* 8.1 7.3  NEUTROABS 9.4*  --   --   HGB 11.7* 11.0* 11.4*  HCT 35.0* 35.0* 35.9*  MCV 93.1 95.1 93.2  PLT 145* 172 162   Basic Metabolic Panel: Recent Labs  Lab 10/11/23 1245 10/12/23 0428 10/14/23 0529 10/15/23 0449  NA 139 136 137 136  K 3.2* 4.0 3.3* 4.3  CL 107 105 105 106  CO2 23 25 23 22   GLUCOSE 105* 99 105* 92  BUN 23 23 23  24*  CREATININE 0.83 0.81 0.63 0.80  CALCIUM 8.4* 7.7* 8.1* 8.3*   GFR: Estimated Creatinine Clearance: 41.8 mL/min (by C-G formula based  on SCr of 0.8 mg/dL). Liver Function Tests: No results for input(s): "AST", "ALT", "ALKPHOS", "BILITOT", "PROT", "ALBUMIN" in the last 168 hours. No results for input(s): "LIPASE", "AMYLASE" in the last 168 hours. No results for input(s): "AMMONIA" in the last 168 hours. Coagulation Profile: No results for input(s): "INR", "PROTIME" in the last 168 hours. Cardiac Enzymes: No results for input(s): "CKTOTAL", "CKMB", "CKMBINDEX", "TROPONINI" in the last 168 hours. BNP (last 3 results) No results for input(s): "PROBNP" in the last 8760 hours. HbA1C: No results for input(s): "HGBA1C" in the last 72 hours. CBG: No results for input(s): "GLUCAP" in the last 168 hours. Lipid Profile: No results for input(s): "CHOL", "HDL", "LDLCALC", "TRIG", "CHOLHDL", "LDLDIRECT" in the last 72 hours. Thyroid Function Tests: No results for input(s): "TSH", "T4TOTAL", "FREET4", "T3FREE", "THYROIDAB" in the last 72 hours. Anemia Panel: No results for input(s): "VITAMINB12", "FOLATE", "FERRITIN", "TIBC", "IRON", "RETICCTPCT" in the last 72 hours. Sepsis Labs: No results for input(s): "PROCALCITON", "LATICACIDVEN" in the last 168 hours.  No results found for this or any previous visit (from the past 240 hours).   Radiology  Studies: No results found.  Scheduled Meds:  amLODipine  10 mg Oral Daily   cholecalciferol  5,000 Units Oral Daily   cyanocobalamin  1,000 mcg Oral Daily   docusate sodium  100 mg Oral BID   donepezil  5 mg Oral Daily   enoxaparin (LOVENOX) injection  40 mg Subcutaneous QHS   escitalopram  10 mg Oral Daily   furosemide  20 mg Oral Daily   hydrALAZINE  25 mg Oral TID   lidocaine  1 patch Transdermal Q24H   losartan  100 mg Oral Daily   metoprolol succinate  25 mg Oral Daily   pantoprazole  40 mg Oral QAC breakfast   Continuous Infusions:   LOS: 3 days   Hughie Closs, MD [facility]  10/16/2023, 12:04 PM   *Please note that this is a verbal dictation therefore any  spelling or grammatical errors are due to the "Dragon Medical One" system interpretation.  Please page via Amion and do not message via secure chat for urgent patient care matters. Secure chat can be used for non urgent patient care matters.  How to contact the [facility] Attending or Consulting provider 7A - 7P or covering provider during after hours 7P -7A, for this patient?  Check the care team in [facility] and look for a) attending/consulting TRH provider listed and b) the [facility] team listed. Page or secure chat 7A-7P. Log into [email or web address] and use [place]'s universal password to access. If you do not have the password, please contact the hospital operator. Locate the [facility] provider you are looking for under [facility] and page to a number that you can be directly reached. If you still have difficulty reaching the provider, please page the [facility] (Director on Call) for the Hospitalists listed on amion for assistance.

## 2023-10-16 NOTE — Plan of Care (Signed)
  Problem: Education: Goal: Knowledge of General Education information will improve Description Including pain rating scale, medication(s)/side effects and non-pharmacologic comfort measures Outcome: Progressing   Problem: Health Behavior/Discharge Planning: Goal: Ability to manage health-related needs will improve Outcome: Progressing   

## 2023-10-16 NOTE — Progress Notes (Signed)
This nurse attempted to place pt's arm in sling. Pt refused. Will attempt throughout the day.

## 2023-10-16 NOTE — Plan of Care (Signed)

## 2023-10-16 NOTE — Progress Notes (Signed)
Physical Therapy Treatment Patient Details Name: Bailey [name] MRN: [id number] DOB: Mar 23, 1941 Today's Date: 10/16/2023   History of Present Illness Patient is 82 year old female with dementia, HTN, hyperlipidemia presented after unwitnessed fall resulting in right scapular fracture, T3 compression fracture    PT Comments  Pt cooperative with PT, participates with encouragement. Pt fatigues easily, continues to require assist for balance/safety demonstrating higher level balance deficits.  D/c plan remains appropriate. Continue to follow in acute setting   If plan is discharge home, recommend the following: A little help with walking and/or transfers;A little help with bathing/dressing/bathroom;Assistance with cooking/housework;Direct supervision/assist for financial management;Supervision due to cognitive status;Help with stairs or ramp for entrance;Assist for transportation   Can travel by private vehicle     Yes  Equipment Recommendations  None recommended by PT (defer to next venue)    Recommendations for Other Services       Precautions / Restrictions Precautions Precautions: Fall Precaution Comments: sling R UE-for comfort, may have sling off, ROM R  shoulder as tolerated Required Braces or Orthoses: Sling Restrictions Weight Bearing Restrictions Per Provider Order: No RUE Weight Bearing Per Provider Order: Weight bearing as tolerated     Mobility  Bed Mobility Overal bed mobility: Needs Assistance Bed Mobility: Supine to Sit, Sit to Supine     Supine to sit: Contact guard, Supervision Sit to supine: Contact guard assist, Supervision   General bed mobility comments: for safety, cues to complete task and self assist; incr tim eneeded to scoot to EOB    Transfers Overall transfer level: Needs assistance Equipment used: 1 person hand held assist, None Transfers: Sit to/from Stand Sit to Stand: Min assist, Contact guard assist           General transfer  comment: STS x2; assist to rise and steady once in standing, cues to widen BOS    Ambulation/Gait Ambulation/Gait assistance: Min assist, Contact guard assist Gait Distance (Feet):  (hallway ambulation) Assistive device: 1 person hand held assist, None Gait Pattern/deviations: Step-through pattern, Narrow base of support, Decreased stride length       General Gait Details: assist to steady, LOB x2 with min assist to recover. decr awareness of balance deficits and safety. therapeutic seated rest d/t fatigue   Stairs             Wheelchair Mobility     Tilt Bed    Modified Rankin (Stroke Patients Only)       Balance     Sitting balance-Leahy Scale: Good     Standing balance support: During functional activity, Single extremity supported Standing balance-Leahy Scale: Poor Standing balance comment: assist needed to for static stand             High level balance activites: Side stepping, Turns, Head turns, Direction changes High Level Balance Comments: min to CGA needed for above; LOB with head turn/visual scanning            Cognition Arousal: Alert Behavior During Therapy: Flat affect Overall Cognitive Status: History of cognitive impairments - at baseline                                 General Comments: impaired safety awarreness, STM deficits. Pt cannot recall falling  or why RUE is painful; repeats self/questions  and has no immediate recall.        Exercises Other Exercises Other Exercises: Gentle AAROM R shoulder int/ext rotation, fwd  shoulder flexion, extension x 5    General Comments        Pertinent Vitals/Pain Pain Assessment Pain Assessment: Faces Faces Pain Scale: Hurts a little bit Pain Location: R UE and back Pain Descriptors / Indicators: Aching, Sore Pain Intervention(s): Limited activity within patient's tolerance, Monitored during session, Repositioned    Home Living                           Prior Function            PT Goals (current goals can now be found in the care plan section) Acute Rehab PT Goals Patient Stated Goal: rehab PT Goal Formulation: With patient/family Time For Goal Achievement: 10/26/23 Potential to Achieve Goals: Good Progress towards PT goals: Progressing toward goals    Frequency    Min 1X/week      PT Plan      Co-evaluation              AM-PAC PT "6 Clicks" Mobility   Outcome Measure  Help needed turning from your back to your side while in a flat bed without using bedrails?: A Little Help needed moving from lying on your back to sitting on the side of a flat bed without using bedrails?: A Little Help needed moving to and from a bed to a chair (including a wheelchair)?: A Little Help needed standing up from a chair using your arms (e.g., wheelchair or bedside chair)?: A Little Help needed to walk in [name] room?: A Little Help needed climbing 3-5 steps with a railing? : A Little 6 Click Score: 18    End of Session Equipment Utilized During Treatment: Gait belt Activity Tolerance: Patient tolerated treatment well Patient left: with call bell/phone within reach;in bed;with family/visitor present;with bed alarm set   PT Visit Diagnosis: Other abnormalities of gait and mobility (R26.89);Difficulty in walking, not elsewhere classified (R26.2)     Time: 1191-4782 PT Time Calculation (min) (ACUTE ONLY): 19 min  Charges:    $Gait Training: 8-22 mins PT General Charges $$ ACUTE PT VISIT: 1 Visit                     Alvie Fowles, PT  Acute Rehab Dept [name] [place]) [phone]  10/16/2023    Bailey [name] 10/16/2023, 11:17 AM

## 2023-10-17 DIAGNOSIS — W19XXXA Unspecified fall, initial encounter: Secondary | ICD-10-CM | POA: Diagnosis not present

## 2023-10-17 DIAGNOSIS — I10 Essential (primary) hypertension: Secondary | ICD-10-CM | POA: Diagnosis not present

## 2023-10-17 NOTE — Progress Notes (Signed)
PROGRESS NOTE    Bailey [name]  RUE:[id number] DOB: Feb 10, 1941 DOA: 10/11/2023 PCP: Rodrigo Ran, MD   Brief Narrative:  Patient is 82 year old female with dementia, HTN, hyperlipidemia presented after unwitnessed fall resulting in right scapular fracture, T3 compression fracture.  Patient lives at home with her husband. Per EMS report, patient was getting out of the shower on the morning of admission when she fell, no loss of consciousness.  She was complaining of pain in her arm and upper back since the fall.  Patient could not recall the fall.   CT showed acute compression fracture of T3 with approximately 40% vertebral body height and 2 mm of retropulsion of the inferior endplate.  Acute fractures of bilateral bridging anterior osteophytes at T3-T4, mild compression fracture of the superior endplate of T4 with approximately 10% vertebral body height loss and 2 mm of retropulsion of the superior endplate.  Acute nondisplaced fracture of the posterior right 7th through 10th ribs at the costovertebral junction.  Acute comminuted fracture of the spine of the right scapula extending into the infraspinatus fossa and inferior angle.   Assessment & Plan:   Principal Problem:   Fall with injury Active Problems:   HTN (hypertension)   Hyperlipidemia   Closed right scapular fracture   Compression fracture of body of thoracic vertebra (HCC)  Fall with injury, closed right scapular body fracture -Orthopedics was consulted, per Dr. Roda Shutters, nonoperative treatment, shoulder sling for comfort.  May weight-bear and perform ROM of the shoulder as tolerated -Follow up in the office in about 3 weeks -Shoulder sling placed -Continue pain control, bowel regimen -PT OT recommended SNF.  TOC working on placement.   Compression fracture of T3, T4 -MRI thoracic spine showed wedge compression deformities at T3 and T4 with approximately 50% height loss at T3 and 25% at T4.  Diffuse T3 bone marrow edema and  superior T4 endplate edema, no retropulsion  -Complains of mid low back pain with movement and deep inspiration -Neurosurgery consulted- Dr. Danielle Dess.  Recommended conservative management, pain control, PT OT and outpatient follow-up.  Typically T3-T4, difficult to get kyphoplasty and does not need TLSO brace. -Continue pain control, conservative management, out pt f/u in 2-3 weeks    posterior right 7th through 10th ribs fracture -Continue as needed pain control -Encouraged incentive spirometry   HTN (hypertension) -BP still elevated but received a.m. medications very late today.  Continue current dose of amlodipine and losartan.  Continue Toprol-XL at reduced dose due to bradycardia.  Continue as needed IV hydralazine and labetalol.  Sinus bradycardia: Patient asymptomatic.  Heart rate in 50s.  Will reduce Toprol-XL dose to half.   Normocytic anemia: H&H is stable.  Continue to monitor   GERD -Continue Protonix   Dementia and depression -Continue Aricept, Lexapro  Hypokalemia: Resolved.  DVT prophylaxis: enoxaparin (LOVENOX) injection 40 mg Start: 10/11/23 2200 SCDs Start: 10/11/23 1626   Code Status: Limited: Do not attempt resuscitation (DNR) -DNR-LIMITED -Do Not Intubate/DNI   Family Communication: None present at bedside.  Status is: Inpatient Remains inpatient appropriate because: Medically stable, pending placement.   Estimated body mass index is 19.05 kg/m as calculated from the following:   Height as of this encounter: 5' 2.5" (1.588 m).   Weight as of this encounter: 48 kg.    Nutritional Assessment: Body mass index is 19.05 kg/m.Marland Kitchen Seen by dietician.  I agree with the assessment and plan as outlined below: Nutrition Status:        . Skin  Assessment: I have examined the patient's skin and I agree with the wound assessment as performed by the wound care RN as outlined below:    Consultants:  Orthopedics and neurosurgery  Procedures:  As  above  Antimicrobials:  Anti-infectives (From admission, onward)    None         Subjective: Seen and examined.  No complaints.  Oriented to self, that is her baseline due to dementia.  Objective: Vitals:   10/16/23 0651 10/16/23 1256 10/16/23 2145 10/17/23 0536  BP: (!) 183/81 (!) 142/68 120/64 (!) 149/69  Pulse: 67 (!) 58 63 (!) 56  Resp: 18 17 18 12   Temp: 98.5 F (36.9 C) 97.6 F (36.4 C) 98 F (36.7 C) 97.8 F (36.6 C)  TempSrc: Oral Oral Oral Oral  SpO2: 97% 97%  96%  Weight:      Height:        Intake/Output Summary (Last 24 hours) at 10/17/2023 1146 Last data filed at 10/17/2023 1130 Gross per 24 hour  Intake 1140 ml  Output --  Net 1140 ml   Filed Weights   10/11/23 1101  Weight: 48 kg    Examination:  General exam: Appears calm and comfortable  Respiratory system: Clear to auscultation. Respiratory effort normal. Cardiovascular system: S1 & S2 heard, RRR. No JVD, murmurs, rubs, gallops or clicks. No pedal edema. Gastrointestinal system: Abdomen is nondistended, soft and nontender. No organomegaly or masses felt. Normal bowel sounds heard. Central nervous system: Alert and oriented x 1. No focal neurological deficits. Extremities: Symmetric 5 x 5 power.   Data Reviewed: I have personally reviewed following labs and imaging studies  CBC: Recent Labs  Lab 10/11/23 1245 10/12/23 0428 10/14/23 0529  WBC 11.3* 8.1 7.3  NEUTROABS 9.4*  --   --   HGB 11.7* 11.0* 11.4*  HCT 35.0* 35.0* 35.9*  MCV 93.1 95.1 93.2  PLT 145* 172 162   Basic Metabolic Panel: Recent Labs  Lab 10/11/23 1245 10/12/23 0428 10/14/23 0529 10/15/23 0449  NA 139 136 137 136  K 3.2* 4.0 3.3* 4.3  CL 107 105 105 106  CO2 23 25 23 22   GLUCOSE 105* 99 105* 92  BUN 23 23 23  24*  CREATININE 0.83 0.81 0.63 0.80  CALCIUM 8.4* 7.7* 8.1* 8.3*   GFR: Estimated Creatinine Clearance: 41.8 mL/min (by C-G formula based on SCr of 0.8 mg/dL). Liver Function Tests: No results  for input(s): "AST", "ALT", "ALKPHOS", "BILITOT", "PROT", "ALBUMIN" in the last 168 hours. No results for input(s): "LIPASE", "AMYLASE" in the last 168 hours. No results for input(s): "AMMONIA" in the last 168 hours. Coagulation Profile: No results for input(s): "INR", "PROTIME" in the last 168 hours. Cardiac Enzymes: No results for input(s): "CKTOTAL", "CKMB", "CKMBINDEX", "TROPONINI" in the last 168 hours. BNP (last 3 results) No results for input(s): "PROBNP" in the last 8760 hours. HbA1C: No results for input(s): "HGBA1C" in the last 72 hours. CBG: No results for input(s): "GLUCAP" in the last 168 hours. Lipid Profile: No results for input(s): "CHOL", "HDL", "LDLCALC", "TRIG", "CHOLHDL", "LDLDIRECT" in the last 72 hours. Thyroid Function Tests: No results for input(s): "TSH", "T4TOTAL", "FREET4", "T3FREE", "THYROIDAB" in the last 72 hours. Anemia Panel: No results for input(s): "VITAMINB12", "FOLATE", "FERRITIN", "TIBC", "IRON", "RETICCTPCT" in the last 72 hours. Sepsis Labs: No results for input(s): "PROCALCITON", "LATICACIDVEN" in the last 168 hours.  No results found for this or any previous visit (from the past 240 hours).   Radiology Studies: No results found.  Scheduled Meds:  amLODipine  10 mg Oral Daily   cholecalciferol  5,000 Units Oral Daily   cyanocobalamin  1,000 mcg Oral Daily   docusate sodium  100 mg Oral BID   donepezil  5 mg Oral Daily   enoxaparin (LOVENOX) injection  40 mg Subcutaneous QHS   escitalopram  10 mg Oral Daily   furosemide  20 mg Oral Daily   hydrALAZINE  25 mg Oral TID   lidocaine  1 patch Transdermal Q24H   losartan  100 mg Oral Daily   metoprolol succinate  25 mg Oral Daily   pantoprazole  40 mg Oral QAC breakfast   Continuous Infusions:   LOS: 4 days   Hughie Closs, MD [facility]  10/17/2023, 11:46 AM   *Please note that this is a verbal dictation therefore any spelling or grammatical errors are due to the "[employer] One" system interpretation.  Please page via Amion and do not message via secure chat for urgent patient care matters. Secure chat can be used for non urgent patient care matters.  How to contact the [facility] Attending or Consulting provider 7A - 7P or covering provider during after hours 7P -7A, for this patient?  Check the care team in [facility] and look for a) attending/consulting TRH provider listed and b) the [facility] team listed. Page or secure chat 7A-7P. Log into [email or web address] and use [facility] universal password to access. If you do not have the password, please contact the hospital operator. Locate the [facility] provider you are looking for under [facility] and page to a number that you can be directly reached. If you still have difficulty reaching the provider, please page the [facility] (Director on Call) for the Hospitalists listed on amion for assistance.

## 2023-10-17 NOTE — TOC Progression Note (Signed)
Transition of Care [facility]) - Progression Note    Patient Details  Name: Bailey [name] MRN: [id number] Date of Birth: 02-Jan-1941  [facility]) CM/SW Contact  Larrie Kass, LCSW Phone Number: 10/17/2023, 12:33 PM  Clinical Narrative:    CSW spoke with pt's daughter to present bed offers, she is requesting time to review. TOC to follow.     Expected Discharge Plan: Skilled Nursing Facility Barriers to Discharge: SNF Pending bed offer, Insurance Authorization  Expected Discharge Plan and Services In-house Referral: Clinical Social Work   Post Acute Care Choice: Skilled Nursing Facility Living arrangements for the past 2 months: Single Family Home                 DME Arranged: N/A DME Agency: NA                   Social Determinants of Health (SDOH) Interventions SDOH Screenings   Food Insecurity: Patient Unable To Answer (10/12/2023)  Housing: Patient Unable To Answer (10/12/2023)  Transportation Needs: Patient Unable To Answer (10/12/2023)  Utilities: Patient Unable To Answer (10/12/2023)  Depression (PHQ2-9): Low Risk  (10/10/2018)  Tobacco Use: Low Risk  (10/11/2023)    Readmission Risk Interventions     No data to display

## 2023-10-17 NOTE — Plan of Care (Signed)
?  Problem: Clinical Measurements: ?Goal: Will remain free from infection ?Outcome: Progressing ?  ?

## 2023-10-18 DIAGNOSIS — E785 Hyperlipidemia, unspecified: Secondary | ICD-10-CM | POA: Diagnosis not present

## 2023-10-18 DIAGNOSIS — S42111D Displaced fracture of body of scapula, right shoulder, subsequent encounter for fracture with routine healing: Secondary | ICD-10-CM | POA: Diagnosis not present

## 2023-10-18 DIAGNOSIS — Z7401 Bed confinement status: Secondary | ICD-10-CM | POA: Diagnosis not present

## 2023-10-18 DIAGNOSIS — F01518 Vascular dementia, unspecified severity, with other behavioral disturbance: Secondary | ICD-10-CM | POA: Diagnosis not present

## 2023-10-18 DIAGNOSIS — M6281 Muscle weakness (generalized): Secondary | ICD-10-CM | POA: Diagnosis not present

## 2023-10-18 DIAGNOSIS — R2681 Unsteadiness on feet: Secondary | ICD-10-CM | POA: Diagnosis not present

## 2023-10-18 DIAGNOSIS — R4182 Altered mental status, unspecified: Secondary | ICD-10-CM | POA: Diagnosis not present

## 2023-10-18 DIAGNOSIS — I495 Sick sinus syndrome: Secondary | ICD-10-CM | POA: Diagnosis not present

## 2023-10-18 DIAGNOSIS — I10 Essential (primary) hypertension: Secondary | ICD-10-CM | POA: Diagnosis not present

## 2023-10-18 DIAGNOSIS — S22030D Wedge compression fracture of third thoracic vertebra, subsequent encounter for fracture with routine healing: Secondary | ICD-10-CM | POA: Diagnosis not present

## 2023-10-18 DIAGNOSIS — I351 Nonrheumatic aortic (valve) insufficiency: Secondary | ICD-10-CM | POA: Diagnosis not present

## 2023-10-18 DIAGNOSIS — W19XXXD Unspecified fall, subsequent encounter: Secondary | ICD-10-CM | POA: Diagnosis not present

## 2023-10-18 DIAGNOSIS — S2231XD Fracture of one rib, right side, subsequent encounter for fracture with routine healing: Secondary | ICD-10-CM | POA: Diagnosis not present

## 2023-10-18 DIAGNOSIS — H9313 Tinnitus, bilateral: Secondary | ICD-10-CM | POA: Diagnosis not present

## 2023-10-18 DIAGNOSIS — G9009 Other idiopathic peripheral autonomic neuropathy: Secondary | ICD-10-CM | POA: Diagnosis not present

## 2023-10-18 DIAGNOSIS — D649 Anemia, unspecified: Secondary | ICD-10-CM | POA: Diagnosis not present

## 2023-10-18 DIAGNOSIS — N390 Urinary tract infection, site not specified: Secondary | ICD-10-CM | POA: Diagnosis not present

## 2023-10-18 DIAGNOSIS — R41841 Cognitive communication deficit: Secondary | ICD-10-CM | POA: Diagnosis not present

## 2023-10-18 DIAGNOSIS — S22000D Wedge compression fracture of unspecified thoracic vertebra, subsequent encounter for fracture with routine healing: Secondary | ICD-10-CM | POA: Diagnosis not present

## 2023-10-18 DIAGNOSIS — K219 Gastro-esophageal reflux disease without esophagitis: Secondary | ICD-10-CM | POA: Diagnosis not present

## 2023-10-18 DIAGNOSIS — W19XXXA Unspecified fall, initial encounter: Secondary | ICD-10-CM | POA: Diagnosis not present

## 2023-10-18 DIAGNOSIS — F32A Depression, unspecified: Secondary | ICD-10-CM | POA: Diagnosis not present

## 2023-10-18 LAB — CBC WITH DIFFERENTIAL/PLATELET
Abs Immature Granulocytes: 0.04 10*3/uL (ref 0.00–0.07)
Basophils Absolute: 0 10*3/uL (ref 0.0–0.1)
Basophils Relative: 0 %
Eosinophils Absolute: 0.3 10*3/uL (ref 0.0–0.5)
Eosinophils Relative: 3 %
HCT: 33.7 % — ABNORMAL LOW (ref 36.0–46.0)
Hemoglobin: 11 g/dL — ABNORMAL LOW (ref 12.0–15.0)
Immature Granulocytes: 0 %
Lymphocytes Relative: 33 %
Lymphs Abs: 3.1 10*3/uL (ref 0.7–4.0)
MCH: 30.1 pg (ref 26.0–34.0)
MCHC: 32.6 g/dL (ref 30.0–36.0)
MCV: 92.3 fL (ref 80.0–100.0)
Monocytes Absolute: 1.1 10*3/uL — ABNORMAL HIGH (ref 0.1–1.0)
Monocytes Relative: 11 %
Neutro Abs: 4.9 10*3/uL (ref 1.7–7.7)
Neutrophils Relative %: 53 %
Platelets: 235 10*3/uL (ref 150–400)
RBC: 3.65 MIL/uL — ABNORMAL LOW (ref 3.87–5.11)
RDW: 13.5 % (ref 11.5–15.5)
WBC: 9.4 10*3/uL (ref 4.0–10.5)
nRBC: 0 % (ref 0.0–0.2)

## 2023-10-18 LAB — BASIC METABOLIC PANEL
Anion gap: 7 (ref 5–15)
BUN: 26 mg/dL — ABNORMAL HIGH (ref 8–23)
CO2: 26 mmol/L (ref 22–32)
Calcium: 8.7 mg/dL — ABNORMAL LOW (ref 8.9–10.3)
Chloride: 99 mmol/L (ref 98–111)
Creatinine, Ser: 0.95 mg/dL (ref 0.44–1.00)
GFR, Estimated: >60 mL/min (ref >60)
Glucose, Bld: 102 mg/dL — ABNORMAL HIGH (ref 70–99)
Potassium: 3.4 mmol/L — ABNORMAL LOW (ref 3.5–5.1)
Sodium: 132 mmol/L — ABNORMAL LOW (ref 135–145)

## 2023-10-18 MED ORDER — METOPROLOL SUCCINATE ER 25 MG PO TB24: 25.0000 mg | ORAL_TABLET | Freq: Every day | ORAL | 0 refills | Status: AC

## 2023-10-18 MED ORDER — HYDRALAZINE HCL 25 MG PO TABS: 25.0000 mg | ORAL_TABLET | Freq: Three times a day (TID) | ORAL | 0 refills | Status: AC

## 2023-10-18 MED ORDER — AMLODIPINE BESYLATE 10 MG PO TABS: 10.0000 mg | ORAL_TABLET | Freq: Every day | ORAL | 0 refills | Status: DC

## 2023-10-18 NOTE — Discharge Summary (Signed)
Physician Discharge Summary  Bailey [name] YQM:[id number] DOB: 02-04-1941 DOA: 10/11/2023  PCP: Rodrigo Ran, MD  Admit date: 10/11/2023 Discharge date: 10/18/2023 30 Day Unplanned Readmission Risk Score    Flowsheet Row ED to Hosp-Admission (Current) from 10/11/2023 in [facility] [place] [facility]  30 Day Unplanned Readmission Risk Score (%) 20.1 Filed at 10/18/2023 1200       This score is the patient's risk of an unplanned readmission within 30 days of being discharged (0 -100%). The score is based on dignosis, age, lab data, medications, orders, and past utilization.   Low:  0-14.9   Medium: 15-21.9   High: 22-29.9   Extreme: 30 and above          Admitted From: Home Disposition: SNF  Recommendations for Outpatient Follow-up:  Follow up with PCP in 1-2 weeks Please obtain BMP/CBC in one week Follow-up with orthopedics Dr. Roda Shutters in 2 weeks Follow-up with Dr. Elsner/neurosurgery in 2 weeks Please follow up with your PCP on the following pending results: Unresulted Labs (From admission, onward)    None         Home Health: None Equipment/Devices: None  Discharge Condition: Stable CODE STATUS: DNR Diet recommendation: Cardiac  Subjective: Seen and examined.  Patient has no complaints.  Lengthy discussion with daughter, husband and son-in-law.  They are all in agreement with discharge to SNF today.  Brief/Interim Summary: Patient is 82 year old female with dementia, HTN, hyperlipidemia presented after unwitnessed fall resulting in right scapular fracture, T3 compression fracture.  Patient lives at home with her husband. Per EMS report, patient was getting out of the shower on the morning of admission when she fell, no loss of consciousness.  She was complaining of pain in her arm and upper back since the fall.  Patient could not recall the fall.   CT showed acute compression fracture of T3 with approximately 40% vertebral body height and 2 mm of retropulsion of the  inferior endplate.  Acute fractures of bilateral bridging anterior osteophytes at T3-T4, mild compression fracture of the superior endplate of T4 with approximately 10% vertebral body height loss and 2 mm of retropulsion of the superior endplate.  Acute nondisplaced fracture of the posterior right 7th through 10th ribs at the costovertebral junction.  Acute comminuted fracture of the spine of the right scapula extending into the infraspinatus fossa and inferior angle.  Admitted under hospital service.  Details below.   Fall with injury, closed right scapular body fracture -Orthopedics was consulted, per Dr. Roda Shutters, nonoperative treatment, shoulder sling for comfort.  May weight-bear and perform ROM of the shoulder as tolerated -Follow up in the office in about 2 weeks -Shoulder sling placed -Continue pain control with oxycodone, bowel regimen -PT OT recommended SNF.  Arrangements have been made, patient is being discharged to SNF today.   Compression fracture of T3, T4 -MRI thoracic spine showed wedge compression deformities at T3 and T4 with approximately 50% height loss at T3 and 25% at T4.  Diffuse T3 bone marrow edema and superior T4 endplate edema, no retropulsion  -Complains of mid low back pain with movement and deep inspiration -Neurosurgery consulted- Dr. Danielle Dess.  Recommended conservative management, pain control, PT OT and outpatient follow-up.  Typically T3-T4, difficult to get kyphoplasty and does not need TLSO brace. -Continue pain control, conservative management, out pt f/u in 2-3 weeks    posterior right 7th through 10th ribs fracture -Continue as needed pain control -Encouraged incentive spirometry.  She is not hypoxic.  She is not dyspneic either.   HTN (hypertension) -BP remained elevated.  Modified her antihypertensives.  See below.   Sinus bradycardia: Patient asymptomatic.  Heart rate in 50s.  Reduced Toprol-XL to half/25 mg p.o. daily.   Normocytic anemia: H&H is stable.   Continue to monitor   GERD -Continue Protonix   Dementia and depression -Continue Aricept, Lexapro   Hypokalemia: Resolved.    Discharge plan was discussed with patient and/or family member and they verbalized understanding and agreed with it.  Discharge Diagnoses:  Principal Problem:   Fall with injury Active Problems:   HTN (hypertension)   Hyperlipidemia   Closed right scapular fracture   Compression fracture of body of thoracic vertebra [facility])    Discharge Instructions   Allergies as of 10/18/2023       Reactions   Alendronate Sodium Other (See Comments)   Made the hips/legs hurt   Amlodipine Other (See Comments)   "Feet swelling and hot flashes"   Aspirin Other (See Comments)   Scleral hemorrhages   Cozaar [losartan] Other (See Comments)   Face broke out   Exelon [rivastigmine] Other (See Comments)   "Just felt badly" when taking it   Fexofenadine Other (See Comments)   "Made her aware of heart beating"   Hydrochlorothiazide Other (See Comments)   Felt it contributed to hair falling out 7/12   Ibuprofen Nausea And Vomiting, Other (See Comments)   Vertigo, also   Ibuprofen Nausea And Vomiting, Other (See Comments)   Also, Dizziness, GI Intolerance   Lisinopril Other (See Comments)   Hair was falling out on it   Loratadine Other (See Comments)   Unknown   Metronidazole Diarrhea, Nausea And Vomiting, Other (See Comments)   GI Intolerance   Olmesartan Other (See Comments)   "I wonder if it contributed to her enteropathy in 2014. so will never give this again."   Ondansetron Nausea And Vomiting, Other (See Comments)   GI Intolerance   Pantoprazole Sodium Other (See Comments)   Bloating   Simvastatin Nausea And Vomiting, Other (See Comments)   Stomach upset        Medication List     STOP taking these medications    amoxicillin 500 MG capsule Commonly known as: AMOXIL   chlorthalidone 25 MG tablet Commonly known as: HYGROTON   Moderna COVID-19  Vaccine 100 MCG/0.5ML injection Generic drug: COVID-19 mRNA vaccine (Moderna)       TAKE these medications    amLODipine 10 MG tablet Commonly known as: NORVASC Take 1 tablet (10 mg total) by mouth daily. Start taking on: October 19, 2023 What changed:  medication strength how much to take   budesonide 3 MG 24 hr capsule Commonly known as: ENTOCORT EC Take 3-6 mg by mouth See admin instructions. Take 3 mg by mouth once a day, ALTERNATING WITH 6 mg every other day   cyanocobalamin 1000 MCG tablet Commonly known as: VITAMIN B12 Take 1,000 mcg by mouth daily.   donepezil 5 MG tablet Commonly known as: ARICEPT Take 5 mg by mouth daily.   escitalopram 10 MG tablet Commonly known as: LEXAPRO Take 10 mg by mouth daily.   furosemide 20 MG tablet Commonly known as: LASIX Take 20 mg by mouth in the morning.   hydrALAZINE 25 MG tablet Commonly known as: APRESOLINE Take 1 tablet (25 mg total) by mouth 3 (three) times daily.   losartan 100 MG tablet Commonly known as: COZAAR Take 100 mg by mouth daily.   metoprolol  succinate 25 MG 24 hr tablet Commonly known as: TOPROL-XL Take 1 tablet (25 mg total) by mouth daily. Start taking on: October 19, 2023 What changed:  medication strength how much to take when to take this additional instructions Another medication with the same name was removed. Continue taking this medication, and follow the directions you see here.   oxyCODONE 5 MG immediate release tablet Commonly known as: Oxy IR/ROXICODONE Take 1 tablet (5 mg total) by mouth every 4 (four) hours as needed for moderate pain (pain score 4-6).   pantoprazole 40 MG tablet Commonly known as: PROTONIX Take 40 mg by mouth daily before breakfast.   potassium chloride 10 MEQ tablet Commonly known as: KLOR-CON Take 10 mEq by mouth See admin instructions. Take 10 mEq by mouth in the morning and afternoon   Vitamin D3 125 MCG (5000 UT) Tabs Take 5,000 Units by mouth  daily.        Contact information for follow-up providers     Rodrigo Ran, MD Follow up in 1 week(s).   Specialty: Internal Medicine Contact information: [address] [place] [address] [phone]              Contact information for after-discharge care     Destination     [facility] Preferred SNF .   Service: Skilled Nursing Contact information: [address] [place] [address] [phone]                    Allergies  Allergen Reactions   Alendronate Sodium Other (See Comments)    Made the hips/legs hurt   Amlodipine Other (See Comments)    "Feet swelling and hot flashes"   Aspirin Other (See Comments)    Scleral hemorrhages   Cozaar [Losartan] Other (See Comments)    Face broke out   Exelon [Rivastigmine] Other (See Comments)    "Just felt badly" when taking it   Fexofenadine Other (See Comments)    "Made her aware of heart beating"   Hydrochlorothiazide Other (See Comments)    Felt it contributed to hair falling out 7/12   Ibuprofen Nausea And Vomiting and Other (See Comments)    Vertigo, also    Ibuprofen Nausea And Vomiting and Other (See Comments)    Also, Dizziness, GI Intolerance   Lisinopril Other (See Comments)    Hair was falling out on it   Loratadine Other (See Comments)    Unknown   Metronidazole Diarrhea, Nausea And Vomiting and Other (See Comments)    GI Intolerance   Olmesartan Other (See Comments)    "I wonder if it contributed to her enteropathy in 2014. so will never give this again."   Ondansetron Nausea And Vomiting and Other (See Comments)    GI Intolerance   Pantoprazole Sodium Other (See Comments)    Bloating   Simvastatin Nausea And Vomiting and Other (See Comments)    Stomach upset    Consultations: Orthopedics and neurosurgery   Procedures/Studies: MR THORACIC SPINE WO CONTRAST Result Date: 10/11/2023 CLINICAL DATA:  Fall EXAM: MRI  THORACIC SPINE WITHOUT CONTRAST TECHNIQUE: Multiplanar, multisequence MR imaging of the thoracic spine was performed. No intravenous contrast was administered. COMPARISON:  None Available. FINDINGS: Alignment:  Physiologic. Vertebrae: There are wedge compression deformities at T3 and T4 with approximately 50% height loss at T3 and 25% at T4. Diffuse T3 bone marrow edema and superior T4 endplate edema. No retropulsion. Cord:  Normal Paraspinal and other  soft tissues: Multiple cystic foci within the liver. Disc levels: No spinal canal stenosis. IMPRESSION: Acute/subacute wedge compression fractures at T3 and T4. Electronically Signed   By: Deatra Robinson M.D.   On: 10/11/2023 20:58   CT CHEST WO CONTRAST Result Date: 10/11/2023 CLINICAL DATA:  Dementia, chest trauma, blunt scapular fracture. Larey Seat this morning getting out of shower. Right arm pain and back pain. EXAM: CT CHEST WITHOUT CONTRAST TECHNIQUE: Multidetector CT imaging of the chest was performed following the standard protocol without IV contrast. RADIATION DOSE REDUCTION: This exam was performed according to the departmental dose-optimization program which includes automated exposure control, adjustment of the mA and/or kV according to patient size and/or use of iterative reconstruction technique. COMPARISON:  Radiographs earlier today FINDINGS: Cardiovascular: Coronary artery and aortic atherosclerotic calcification. Normal caliber thoracic aorta. Mediastinum/Nodes: Trachea and esophagus are unremarkable. No thoracic adenopathy. Lungs/Pleura: Biapical pleural-parenchymal scarring. Bilateral mild bronchiectasis/bronchiolectasis. Clustered micro nodularity in the right middle lobe. Right middle lobe atelectasis. Bibasilar scarring. No pleural effusion or pneumothorax. Upper Abdomen: Hypoattenuating lesions in the liver are presumed cysts. No acute abnormality. Musculoskeletal: Acute compression fracture of T3 with approximately 40% vertebral body height  loss anteriorly. 2 mm of retropulsion of the inferior endplate. Additional fractures of the bilateral bridging anterior osteophytes at T3-T4. Mild compression fracture of the superior endplate of T4 with approximately 10% vertebral body height loss and 2 mm of retropulsion of the superior endplate Acute comminuted fracture of the spine of the right scapula extending into the infraspinatus fossa and inferior angle. Hematoma within the rotator cuff musculature about the fracture. Acute nondisplaced fractures of the posterior right 7th- 10th ribs at the costovertebral junction. IMPRESSION: 1. Acute compression fracture of T3 with approximately 40% vertebral body height and 2 mm of retropulsion of the inferior endplate. 2. Acute fractures of the bilateral bridging anterior osteophytes at T3-T4. 3. Mild compression fracture of the superior endplate of T4 with approximately 10% vertebral body height loss and 2 mm of retropulsion of the superior endplate. 4. Acute comminuted fracture of the spine of the right scapula extending into the infraspinatus fossa and inferior angle. 5. Acute nondisplaced fractures of the posterior right 7th- 10th ribs at the costovertebral junction. Aortic Atherosclerosis (ICD10-I70.0). Electronically Signed   By: Minerva Fester M.D.   On: 10/11/2023 19:54   DG Lumbar Spine 2-3 Views Result Date: 10/11/2023 CLINICAL DATA:  Low back pain after fall. EXAM: LUMBAR SPINE - 2-3 VIEW COMPARISON:  None Available. FINDINGS: Mild grade 1 anterolisthesis of L3-4 is noted most likely due to posterior facet joint hypertrophy. Severe degenerative disc disease is noted at L3-4. No fracture is noted. IMPRESSION: Severe degenerative disc disease of L3-4. No acute abnormality seen. Aortic Atherosclerosis (ICD10-I70.0). Electronically Signed   By: Lupita Raider M.D.   On: 10/11/2023 15:51   DG Thoracic Spine 2 View Result Date: 10/11/2023 CLINICAL DATA:  Upper back pain after fall. EXAM: THORACIC SPINE 2  VIEWS COMPARISON:  None Available. FINDINGS: Mild compression deformity of T3 vertebral body is noted concerning for fracture of indeterminate age. No spondylolisthesis is noted. Disc spaces are well-maintained. IMPRESSION: Mild compression deformity of T3 vertebral body concerning for fracture of indeterminate age. MRI may be performed for further evaluation. Electronically Signed   By: Lupita Raider M.D.   On: 10/11/2023 15:49   DG Shoulder Right Result Date: 10/11/2023 CLINICAL DATA:  Fall with right shoulder pain EXAM: RIGHT SHOULDER - 2+ VIEW COMPARISON:  None Available. FINDINGS: AP and  scapular views. There are displaced fractures of the mid and inferior scapular body. No extension into the glenoid. The humeral head is intact. IMPRESSION: Displaced scapular body fractures. Consider further evaluation with CT. Electronically Signed   By: Jeronimo Greaves M.D.   On: 10/11/2023 15:21   DG Chest 2 View Result Date: 10/11/2023 CLINICAL DATA:  Fall with back and right shoulder pain EXAM: CHEST - 2 VIEW COMPARISON:  None Available. FINDINGS: AP and lateral views. The lateral view is moderately degraded by patient arm position, not raised above the head. Midline trachea. Normal heart size. Atherosclerosis in the transverse aorta. No pleural effusion or pneumothorax. Nonspecific mild interstitial thickening for age. Biapical pleural thickening. IMPRESSION: No acute cardiopulmonary disease. Aortic Atherosclerosis (ICD10-I70.0). Electronically Signed   By: Jeronimo Greaves M.D.   On: 10/11/2023 15:18   CT Head Wo Contrast Result Date: 10/11/2023 CLINICAL DATA:  Fall, arm and back pain, no loss consciousness EXAM: CT HEAD WITHOUT CONTRAST CT CERVICAL SPINE WITHOUT CONTRAST TECHNIQUE: Multidetector CT imaging of the head and cervical spine was performed following the standard protocol without intravenous contrast. Multiplanar CT image reconstructions of the cervical spine were also generated. RADIATION DOSE  REDUCTION: This exam was performed according to the departmental dose-optimization program which includes automated exposure control, adjustment of the mA and/or kV according to patient size and/or use of iterative reconstruction technique. COMPARISON:  06/13/2021 CT head, no prior CT cervical spine available, correlation is made with 12/04/2016 MRI cervical spine FINDINGS: CT HEAD FINDINGS Brain: No evidence of acute infarct, hemorrhage, mass, mass effect, or midline shift. No hydrocephalus or extra-axial fluid collection. Age related cerebral volume loss. Vascular: No hyperdense vessel. Skull: Negative for fracture or focal lesion. Sinuses/Orbits: No acute finding. Other: The mastoid air cells are well aerated. CT CERVICAL SPINE FINDINGS Alignment: No traumatic listhesis. Reversal of the normal cervical lordosis with anterolisthesis of C4 on C5 and C7 on T1 and retrolisthesis of C5 on C6, which appears facet mediated. Skull base and vertebrae: No acute fracture or suspicious osseous lesion. Soft tissues and spinal canal: No prevertebral fluid or swelling. No visible canal hematoma. Disc levels: Degenerative changes in the cervical spine.Moderate spinal canal stenosis at C5-C6. Upper chest: No focal pulmonary opacity or pleural effusion. IMPRESSION: 1. No acute intracranial process. 2. No acute fracture or traumatic listhesis in the cervical spine. Electronically Signed   By: Wiliam Ke M.D.   On: 10/11/2023 13:34   CT Cervical Spine Wo Contrast Result Date: 10/11/2023 CLINICAL DATA:  Fall, arm and back pain, no loss consciousness EXAM: CT HEAD WITHOUT CONTRAST CT CERVICAL SPINE WITHOUT CONTRAST TECHNIQUE: Multidetector CT imaging of the head and cervical spine was performed following the standard protocol without intravenous contrast. Multiplanar CT image reconstructions of the cervical spine were also generated. RADIATION DOSE REDUCTION: This exam was performed according to the departmental  dose-optimization program which includes automated exposure control, adjustment of the mA and/or kV according to patient size and/or use of iterative reconstruction technique. COMPARISON:  06/13/2021 CT head, no prior CT cervical spine available, correlation is made with 12/04/2016 MRI cervical spine FINDINGS: CT HEAD FINDINGS Brain: No evidence of acute infarct, hemorrhage, mass, mass effect, or midline shift. No hydrocephalus or extra-axial fluid collection. Age related cerebral volume loss. Vascular: No hyperdense vessel. Skull: Negative for fracture or focal lesion. Sinuses/Orbits: No acute finding. Other: The mastoid air cells are well aerated. CT CERVICAL SPINE FINDINGS Alignment: No traumatic listhesis. Reversal of the normal cervical lordosis with anterolisthesis  of C4 on C5 and C7 on T1 and retrolisthesis of C5 on C6, which appears facet mediated. Skull base and vertebrae: No acute fracture or suspicious osseous lesion. Soft tissues and spinal canal: No prevertebral fluid or swelling. No visible canal hematoma. Disc levels: Degenerative changes in the cervical spine.Moderate spinal canal stenosis at C5-C6. Upper chest: No focal pulmonary opacity or pleural effusion. IMPRESSION: 1. No acute intracranial process. 2. No acute fracture or traumatic listhesis in the cervical spine. Electronically Signed   By: Wiliam Ke M.D.   On: 10/11/2023 13:34     Discharge Exam: Vitals:   10/18/23 0633 10/18/23 1359  BP: (!) 148/69 119/68  Pulse: 63 (!) 58  Resp: 18 17  Temp: (!) 97.5 F (36.4 C) 97.9 F (36.6 C)  SpO2: 97% 98%   Vitals:   10/17/23 1408 10/17/23 2018 10/18/23 0633 10/18/23 1359  BP: 102/62 132/67 (!) 148/69 119/68  Pulse: (!) 59 (!) 53 63 (!) 58  Resp: 20 18 18 17   Temp: 98.5 F (36.9 C) 98.6 F (37 C) (!) 97.5 F (36.4 C) 97.9 F (36.6 C)  TempSrc:  Oral Oral   SpO2: 96% 97% 97% 98%  Weight:      Height:        General: Pt is alert, awake, not in acute  distress Cardiovascular: RRR, S1/S2 +, no rubs, no gallops Respiratory: CTA bilaterally, no wheezing, no rhonchi Abdominal: Soft, NT, ND, bowel sounds + Extremities: no edema, no cyanosis    The results of significant diagnostics from this hospitalization (including imaging, microbiology, ancillary and laboratory) are listed below for reference.     Microbiology: No results found for this or any previous visit (from the past 240 hours).   Labs: BNP (last 3 results) No results for input(s): "BNP" in the last 8760 hours. Basic Metabolic Panel: Recent Labs  Lab 10/12/23 0428 10/14/23 0529 10/15/23 0449 10/18/23 0437  NA 136 137 136 132*  K 4.0 3.3* 4.3 3.4*  CL 105 105 106 99  CO2 25 23 22 26   GLUCOSE 99 105* 92 102*  BUN 23 23 24* 26*  CREATININE 0.81 0.63 0.80 0.95  CALCIUM 7.7* 8.1* 8.3* 8.7*   Liver Function Tests: No results for input(s): "AST", "ALT", "ALKPHOS", "BILITOT", "PROT", "ALBUMIN" in the last 168 hours. No results for input(s): "LIPASE", "AMYLASE" in the last 168 hours. No results for input(s): "AMMONIA" in the last 168 hours. CBC: Recent Labs  Lab 10/12/23 0428 10/14/23 0529 10/18/23 0437  WBC 8.1 7.3 9.4  NEUTROABS  --   --  4.9  HGB 11.0* 11.4* 11.0*  HCT 35.0* 35.9* 33.7*  MCV 95.1 93.2 92.3  PLT 172 162 235   Cardiac Enzymes: No results for input(s): "CKTOTAL", "CKMB", "CKMBINDEX", "TROPONINI" in the last 168 hours. BNP: Invalid input(s): "POCBNP" CBG: No results for input(s): "GLUCAP" in the last 168 hours. D-Dimer No results for input(s): "DDIMER" in the last 72 hours. Hgb A1c No results for input(s): "HGBA1C" in the last 72 hours. Lipid Profile No results for input(s): "CHOL", "HDL", "LDLCALC", "TRIG", "CHOLHDL", "LDLDIRECT" in the last 72 hours. Thyroid function studies No results for input(s): "TSH", "T4TOTAL", "T3FREE", "THYROIDAB" in the last 72 hours.  Invalid input(s): "FREET3" Anemia work up No results for input(s):  "VITAMINB12", "FOLATE", "FERRITIN", "TIBC", "IRON", "RETICCTPCT" in the last 72 hours. Urinalysis    Component Value Date/Time   COLORURINE YELLOW 05/17/2022 1700   APPEARANCEUR CLEAR 05/17/2022 1700   LABSPEC 1.007 05/17/2022 1700  PHURINE 5.0 05/17/2022 1700   GLUCOSEU NEGATIVE 05/17/2022 1700   HGBUR NEGATIVE 05/17/2022 1700   BILIRUBINUR NEGATIVE 05/17/2022 1700   BILIRUBINUR n 10/02/2018 1446   KETONESUR NEGATIVE 05/17/2022 1700   PROTEINUR NEGATIVE 05/17/2022 1700   UROBILINOGEN negative (A) 10/02/2018 1446   UROBILINOGEN 0.2 01/06/2013 1723   NITRITE NEGATIVE 05/17/2022 1700   LEUKOCYTESUR NEGATIVE 05/17/2022 1700   Sepsis Labs Recent Labs  Lab 10/12/23 0428 10/14/23 0529 10/18/23 0437  WBC 8.1 7.3 9.4   Microbiology No results found for this or any previous visit (from the past 240 hours).  FURTHER DISCHARGE INSTRUCTIONS:   Get Medicines reviewed and adjusted: Please take all your medications with you for your next visit with your Primary MD   Laboratory/radiological data: Please request your Primary MD to go over all hospital tests and procedure/radiological results at the follow up, please ask your Primary MD to get all Hospital records sent to his/her office.   In some cases, they will be blood work, cultures and biopsy results pending at the time of your discharge. Please request that your primary care M.D. goes through all the records of your hospital data and follows up on these results.   Also Note the following: If you experience worsening of your admission symptoms, develop shortness of breath, life threatening emergency, suicidal or homicidal thoughts you must seek medical attention immediately by calling 911 or calling your MD immediately  if symptoms less severe.   You must read complete instructions/literature along with all the possible adverse reactions/side effects for all the Medicines you take and that have been prescribed to you. Take any new  Medicines after you have completely understood and accpet all the possible adverse reactions/side effects.    Do not drive when taking Pain medications or sleeping medications (Benzodaizepines)   Do not take more than prescribed Pain, Sleep and Anxiety Medications. It is not advisable to combine anxiety,sleep and pain medications without talking with your primary care practitioner   Special Instructions: If you have smoked or chewed Tobacco  in the last 2 yrs please stop smoking, stop any regular Alcohol  and or any Recreational drug use.   Wear Seat belts while driving.   Please note: You were cared for by a hospitalist during your hospital stay. Once you are discharged, your primary care physician will handle any further medical issues. Please note that NO REFILLS for any discharge medications will be authorized once you are discharged, as it is imperative that you return to your primary care physician (or establish a relationship with a primary care physician if you do not have one) for your post hospital discharge needs so that they can reassess your need for medications and monitor your lab values  Time coordinating discharge: Over 30 minutes  SIGNED:   Hughie Closs, MD  [facility] 10/18/2023, 2:09 PM *Please note that this is a verbal dictation therefore any spelling or grammatical errors are due to the "Dragon Medical One" system interpretation. If 7PM-7AM, please contact night-coverage [email or web address]

## 2023-10-18 NOTE — Plan of Care (Signed)

## 2023-10-18 NOTE — TOC Transition Note (Signed)
Transition of Care [facility]) - Discharge Note  Patient Details  Name: Bailey [name] MRN: [id number] Date of Birth: 10/15/1941  Transition of Care [facility]) CM/SW Contact:  Ewing Schlein, LCSW Phone Number: 10/18/2023, 2:42 PM  Clinical Narrative: CSW followed up with [facility] at [facility] to see if the facility can make a bed offer after previously declining the referral. Per [facility], she still does not have a bed available. [facility] also did not make a bed offer. Daughter selected [facility]. CSW confirmed bed with Eber Jones in admissions and it is available today pending insurance approval. Insurance authorization completed on [employer] portal. Reference ID # is: [id number]. Patient is approved for 10/18/2023-10/22/2023. CSW updated Eber Jones. Discharge summary, discharge orders, and SNF transfer report faxed to facility in hub. Medical necessity form done; PTAR scheduled. Discharge packet completed. Daughter notified of transportation being set up. RN updated TOC signing off.  Final next level of care: Skilled Nursing Facility Barriers to Discharge: Barriers Resolved  Patient Goals and CMS Choice Patient states their goals for this hospitalization and ongoing recovery are:: Go to rehab if possible CMS Medicare.gov Compare Post Acute Care list provided to:: Patient Represenative (must comment) Choice offered to / list presented to : Adult Children  Discharge Placement PASRR number recieved: 10/15/23 Patient chooses bed at: [facility] Patient to be transferred to facility by: PTAR Name of family member notified: [name] (daughter) Patient and family notified of of transfer: 10/18/23  Discharge Plan and Services Additional resources added to the After Visit Summary for   In-house Referral: Clinical Social Work Post Acute Care Choice: Skilled Nursing Facility          DME Arranged: N/A DME Agency: NA  Social Drivers of Health (SDOH) Interventions SDOH  Screenings   Food Insecurity: Patient Unable To Answer (10/12/2023)  Housing: Patient Unable To Answer (10/12/2023)  Transportation Needs: Patient Unable To Answer (10/12/2023)  Utilities: Patient Unable To Answer (10/12/2023)  Depression (PHQ2-9): Low Risk  (10/10/2018)  Tobacco Use: Low Risk  (10/11/2023)   Readmission Risk Interventions     No data to display

## 2023-10-18 NOTE — TOC Progression Note (Signed)
Transition of Care [facility]) - Progression Note   Patient Details  Name: Bailey [name] MRN: [id number] Date of Birth: 07/20/1941  Transition of Care [facility]) CM/SW Contact  Ewing Schlein, LCSW Phone Number: 10/18/2023, 10:29 AM  Clinical Narrative: CSW followed up with daughter regarding bed offers that were provided yesterday. Daughter is requesting [facility], which already declined the referral, and [address]"). CSW explained [facility] did not provide a bed offer. Daughter became upset that she is being asked to choose a SNF since she was given the beds "on Christmas Day." Daughter became increasingly angry and stated, "Fucking nothing has been done for my mother. I brought her in last Thursday with broken bones." Daughter proceeded to hang up on CSW.  Expected Discharge Plan: Skilled Nursing Facility Barriers to Discharge: SNF Pending bed offer, Insurance Authorization  Expected Discharge Plan and Services In-house Referral: Clinical Social Work Post Acute Care Choice: Skilled Nursing Facility Living arrangements for the past 2 months: Single Family Home Expected Discharge Date: 10/18/23               DME Arranged: N/A DME Agency: NA  Social Determinants of Health (SDOH) Interventions SDOH Screenings   Food Insecurity: Patient Unable To Answer (10/12/2023)  Housing: Patient Unable To Answer (10/12/2023)  Transportation Needs: Patient Unable To Answer (10/12/2023)  Utilities: Patient Unable To Answer (10/12/2023)  Depression (PHQ2-9): Low Risk  (10/10/2018)  Tobacco Use: Low Risk  (10/11/2023)   Readmission Risk Interventions     No data to display

## 2023-10-18 NOTE — Progress Notes (Signed)
Pt's daughter called in to get an update on her mother. Explained that pt is medically stable and is ready to be discharged to a facility. Daughter expressed concern that we were "rushing my mother" and that she still needs to be in the hospital. Multiple questions answered, daughter allowed to vent and support offered. Daughter concerned that we "have not fixed anything" on her mother . . explained that her fractures are all non-surgical, and her other medical issues are chronic (dementia, hyperlipidemia, hypertension, anemia, GERD). Explained that SNF will indeed give pt pain meds as needed. TOC notified of above.

## 2023-10-18 NOTE — Progress Notes (Addendum)
Placed AVS in pt's D/C packet, attempted to call report to facility but was transferred several times. Pt d/c via PTAR w all belongings in stable condition.

## 2023-10-22 DIAGNOSIS — S42111D Displaced fracture of body of scapula, right shoulder, subsequent encounter for fracture with routine healing: Secondary | ICD-10-CM | POA: Diagnosis not present

## 2023-10-22 DIAGNOSIS — D649 Anemia, unspecified: Secondary | ICD-10-CM | POA: Diagnosis not present

## 2023-10-22 DIAGNOSIS — K219 Gastro-esophageal reflux disease without esophagitis: Secondary | ICD-10-CM | POA: Diagnosis not present

## 2023-10-22 DIAGNOSIS — I10 Essential (primary) hypertension: Secondary | ICD-10-CM | POA: Diagnosis not present

## 2023-10-25 DIAGNOSIS — I10 Essential (primary) hypertension: Secondary | ICD-10-CM | POA: Diagnosis not present

## 2023-10-25 DIAGNOSIS — D649 Anemia, unspecified: Secondary | ICD-10-CM | POA: Diagnosis not present

## 2023-10-25 DIAGNOSIS — S42111D Displaced fracture of body of scapula, right shoulder, subsequent encounter for fracture with routine healing: Secondary | ICD-10-CM | POA: Diagnosis not present

## 2023-10-25 DIAGNOSIS — S2231XD Fracture of one rib, right side, subsequent encounter for fracture with routine healing: Secondary | ICD-10-CM | POA: Diagnosis not present

## 2023-10-29 DIAGNOSIS — S42111D Displaced fracture of body of scapula, right shoulder, subsequent encounter for fracture with routine healing: Secondary | ICD-10-CM | POA: Diagnosis not present

## 2023-10-29 DIAGNOSIS — I10 Essential (primary) hypertension: Secondary | ICD-10-CM | POA: Diagnosis not present

## 2023-10-29 DIAGNOSIS — N390 Urinary tract infection, site not specified: Secondary | ICD-10-CM | POA: Diagnosis not present

## 2023-10-29 DIAGNOSIS — S22000D Wedge compression fracture of unspecified thoracic vertebra, subsequent encounter for fracture with routine healing: Secondary | ICD-10-CM | POA: Diagnosis not present

## 2023-11-02 ENCOUNTER — Encounter: Payer: Medicare Other | Admitting: Orthopaedic Surgery

## 2023-11-05 DIAGNOSIS — E876 Hypokalemia: Secondary | ICD-10-CM | POA: Diagnosis not present

## 2023-11-05 DIAGNOSIS — E785 Hyperlipidemia, unspecified: Secondary | ICD-10-CM | POA: Diagnosis not present

## 2023-11-05 DIAGNOSIS — S22038D Other fracture of third thoracic vertebra, subsequent encounter for fracture with routine healing: Secondary | ICD-10-CM | POA: Diagnosis not present

## 2023-11-05 DIAGNOSIS — F32A Depression, unspecified: Secondary | ICD-10-CM | POA: Diagnosis not present

## 2023-11-05 DIAGNOSIS — G629 Polyneuropathy, unspecified: Secondary | ICD-10-CM | POA: Diagnosis not present

## 2023-11-05 DIAGNOSIS — F0394 Unspecified dementia, unspecified severity, with anxiety: Secondary | ICD-10-CM | POA: Diagnosis not present

## 2023-11-05 DIAGNOSIS — D649 Anemia, unspecified: Secondary | ICD-10-CM | POA: Diagnosis not present

## 2023-11-05 DIAGNOSIS — Z860100 Personal history of colon polyps, unspecified: Secondary | ICD-10-CM | POA: Diagnosis not present

## 2023-11-05 DIAGNOSIS — E559 Vitamin D deficiency, unspecified: Secondary | ICD-10-CM | POA: Diagnosis not present

## 2023-11-05 DIAGNOSIS — S2241XD Multiple fractures of ribs, right side, subsequent encounter for fracture with routine healing: Secondary | ICD-10-CM | POA: Diagnosis not present

## 2023-11-05 DIAGNOSIS — Z556 Problems related to health literacy: Secondary | ICD-10-CM | POA: Diagnosis not present

## 2023-11-05 DIAGNOSIS — Q2381 Bicuspid aortic valve: Secondary | ICD-10-CM | POA: Diagnosis not present

## 2023-11-05 DIAGNOSIS — Z8701 Personal history of pneumonia (recurrent): Secondary | ICD-10-CM | POA: Diagnosis not present

## 2023-11-05 DIAGNOSIS — Z85828 Personal history of other malignant neoplasm of skin: Secondary | ICD-10-CM | POA: Diagnosis not present

## 2023-11-05 DIAGNOSIS — I351 Nonrheumatic aortic (valve) insufficiency: Secondary | ICD-10-CM | POA: Diagnosis not present

## 2023-11-05 DIAGNOSIS — S42111D Displaced fracture of body of scapula, right shoulder, subsequent encounter for fracture with routine healing: Secondary | ICD-10-CM | POA: Diagnosis not present

## 2023-11-05 DIAGNOSIS — M1990 Unspecified osteoarthritis, unspecified site: Secondary | ICD-10-CM | POA: Diagnosis not present

## 2023-11-05 DIAGNOSIS — K529 Noninfective gastroenteritis and colitis, unspecified: Secondary | ICD-10-CM | POA: Diagnosis not present

## 2023-11-05 DIAGNOSIS — M519 Unspecified thoracic, thoracolumbar and lumbosacral intervertebral disc disorder: Secondary | ICD-10-CM | POA: Diagnosis not present

## 2023-11-05 DIAGNOSIS — K219 Gastro-esophageal reflux disease without esophagitis: Secondary | ICD-10-CM | POA: Diagnosis not present

## 2023-11-05 DIAGNOSIS — G43909 Migraine, unspecified, not intractable, without status migrainosus: Secondary | ICD-10-CM | POA: Diagnosis not present

## 2023-11-05 DIAGNOSIS — I10 Essential (primary) hypertension: Secondary | ICD-10-CM | POA: Diagnosis not present

## 2023-11-05 DIAGNOSIS — S22048D Other fracture of fourth thoracic vertebra, subsequent encounter for fracture with routine healing: Secondary | ICD-10-CM | POA: Diagnosis not present

## 2023-11-05 DIAGNOSIS — W1830XD Fall on same level, unspecified, subsequent encounter: Secondary | ICD-10-CM | POA: Diagnosis not present

## 2023-11-05 DIAGNOSIS — F0393 Unspecified dementia, unspecified severity, with mood disturbance: Secondary | ICD-10-CM | POA: Diagnosis not present

## 2023-11-08 DIAGNOSIS — S42111D Displaced fracture of body of scapula, right shoulder, subsequent encounter for fracture with routine healing: Secondary | ICD-10-CM | POA: Diagnosis not present

## 2023-11-08 DIAGNOSIS — Z85828 Personal history of other malignant neoplasm of skin: Secondary | ICD-10-CM | POA: Diagnosis not present

## 2023-11-08 DIAGNOSIS — W1830XD Fall on same level, unspecified, subsequent encounter: Secondary | ICD-10-CM | POA: Diagnosis not present

## 2023-11-08 DIAGNOSIS — S22038D Other fracture of third thoracic vertebra, subsequent encounter for fracture with routine healing: Secondary | ICD-10-CM | POA: Diagnosis not present

## 2023-11-08 DIAGNOSIS — I351 Nonrheumatic aortic (valve) insufficiency: Secondary | ICD-10-CM | POA: Diagnosis not present

## 2023-11-08 DIAGNOSIS — Q2381 Bicuspid aortic valve: Secondary | ICD-10-CM | POA: Diagnosis not present

## 2023-11-08 DIAGNOSIS — F32A Depression, unspecified: Secondary | ICD-10-CM | POA: Diagnosis not present

## 2023-11-08 DIAGNOSIS — D649 Anemia, unspecified: Secondary | ICD-10-CM | POA: Diagnosis not present

## 2023-11-08 DIAGNOSIS — E785 Hyperlipidemia, unspecified: Secondary | ICD-10-CM | POA: Diagnosis not present

## 2023-11-08 DIAGNOSIS — Z860100 Personal history of colon polyps, unspecified: Secondary | ICD-10-CM | POA: Diagnosis not present

## 2023-11-08 DIAGNOSIS — M1990 Unspecified osteoarthritis, unspecified site: Secondary | ICD-10-CM | POA: Diagnosis not present

## 2023-11-08 DIAGNOSIS — K529 Noninfective gastroenteritis and colitis, unspecified: Secondary | ICD-10-CM | POA: Diagnosis not present

## 2023-11-08 DIAGNOSIS — Z556 Problems related to health literacy: Secondary | ICD-10-CM | POA: Diagnosis not present

## 2023-11-08 DIAGNOSIS — E559 Vitamin D deficiency, unspecified: Secondary | ICD-10-CM | POA: Diagnosis not present

## 2023-11-08 DIAGNOSIS — F0393 Unspecified dementia, unspecified severity, with mood disturbance: Secondary | ICD-10-CM | POA: Diagnosis not present

## 2023-11-08 DIAGNOSIS — K219 Gastro-esophageal reflux disease without esophagitis: Secondary | ICD-10-CM | POA: Diagnosis not present

## 2023-11-08 DIAGNOSIS — G43909 Migraine, unspecified, not intractable, without status migrainosus: Secondary | ICD-10-CM | POA: Diagnosis not present

## 2023-11-08 DIAGNOSIS — I10 Essential (primary) hypertension: Secondary | ICD-10-CM | POA: Diagnosis not present

## 2023-11-08 DIAGNOSIS — S22048D Other fracture of fourth thoracic vertebra, subsequent encounter for fracture with routine healing: Secondary | ICD-10-CM | POA: Diagnosis not present

## 2023-11-08 DIAGNOSIS — G629 Polyneuropathy, unspecified: Secondary | ICD-10-CM | POA: Diagnosis not present

## 2023-11-08 DIAGNOSIS — F0394 Unspecified dementia, unspecified severity, with anxiety: Secondary | ICD-10-CM | POA: Diagnosis not present

## 2023-11-08 DIAGNOSIS — S2241XD Multiple fractures of ribs, right side, subsequent encounter for fracture with routine healing: Secondary | ICD-10-CM | POA: Diagnosis not present

## 2023-11-08 DIAGNOSIS — M519 Unspecified thoracic, thoracolumbar and lumbosacral intervertebral disc disorder: Secondary | ICD-10-CM | POA: Diagnosis not present

## 2023-11-08 DIAGNOSIS — E876 Hypokalemia: Secondary | ICD-10-CM | POA: Diagnosis not present

## 2023-11-08 DIAGNOSIS — Z8701 Personal history of pneumonia (recurrent): Secondary | ICD-10-CM | POA: Diagnosis not present

## 2023-11-09 DIAGNOSIS — K529 Noninfective gastroenteritis and colitis, unspecified: Secondary | ICD-10-CM | POA: Diagnosis not present

## 2023-11-09 DIAGNOSIS — F0394 Unspecified dementia, unspecified severity, with anxiety: Secondary | ICD-10-CM | POA: Diagnosis not present

## 2023-11-09 DIAGNOSIS — E559 Vitamin D deficiency, unspecified: Secondary | ICD-10-CM | POA: Diagnosis not present

## 2023-11-09 DIAGNOSIS — S22038D Other fracture of third thoracic vertebra, subsequent encounter for fracture with routine healing: Secondary | ICD-10-CM | POA: Diagnosis not present

## 2023-11-09 DIAGNOSIS — E876 Hypokalemia: Secondary | ICD-10-CM | POA: Diagnosis not present

## 2023-11-09 DIAGNOSIS — G629 Polyneuropathy, unspecified: Secondary | ICD-10-CM | POA: Diagnosis not present

## 2023-11-09 DIAGNOSIS — D649 Anemia, unspecified: Secondary | ICD-10-CM | POA: Diagnosis not present

## 2023-11-09 DIAGNOSIS — S42111D Displaced fracture of body of scapula, right shoulder, subsequent encounter for fracture with routine healing: Secondary | ICD-10-CM | POA: Diagnosis not present

## 2023-11-09 DIAGNOSIS — K219 Gastro-esophageal reflux disease without esophagitis: Secondary | ICD-10-CM | POA: Diagnosis not present

## 2023-11-09 DIAGNOSIS — F32A Depression, unspecified: Secondary | ICD-10-CM | POA: Diagnosis not present

## 2023-11-09 DIAGNOSIS — I10 Essential (primary) hypertension: Secondary | ICD-10-CM | POA: Diagnosis not present

## 2023-11-09 DIAGNOSIS — M519 Unspecified thoracic, thoracolumbar and lumbosacral intervertebral disc disorder: Secondary | ICD-10-CM | POA: Diagnosis not present

## 2023-11-09 DIAGNOSIS — F0393 Unspecified dementia, unspecified severity, with mood disturbance: Secondary | ICD-10-CM | POA: Diagnosis not present

## 2023-11-09 DIAGNOSIS — M1990 Unspecified osteoarthritis, unspecified site: Secondary | ICD-10-CM | POA: Diagnosis not present

## 2023-11-09 DIAGNOSIS — S22048D Other fracture of fourth thoracic vertebra, subsequent encounter for fracture with routine healing: Secondary | ICD-10-CM | POA: Diagnosis not present

## 2023-11-09 DIAGNOSIS — Z85828 Personal history of other malignant neoplasm of skin: Secondary | ICD-10-CM | POA: Diagnosis not present

## 2023-11-09 DIAGNOSIS — S2241XD Multiple fractures of ribs, right side, subsequent encounter for fracture with routine healing: Secondary | ICD-10-CM | POA: Diagnosis not present

## 2023-11-09 DIAGNOSIS — Z556 Problems related to health literacy: Secondary | ICD-10-CM | POA: Diagnosis not present

## 2023-11-09 DIAGNOSIS — G43909 Migraine, unspecified, not intractable, without status migrainosus: Secondary | ICD-10-CM | POA: Diagnosis not present

## 2023-11-09 DIAGNOSIS — W1830XD Fall on same level, unspecified, subsequent encounter: Secondary | ICD-10-CM | POA: Diagnosis not present

## 2023-11-09 DIAGNOSIS — E785 Hyperlipidemia, unspecified: Secondary | ICD-10-CM | POA: Diagnosis not present

## 2023-11-09 DIAGNOSIS — Z8701 Personal history of pneumonia (recurrent): Secondary | ICD-10-CM | POA: Diagnosis not present

## 2023-11-09 DIAGNOSIS — Z860100 Personal history of colon polyps, unspecified: Secondary | ICD-10-CM | POA: Diagnosis not present

## 2023-11-09 DIAGNOSIS — Q2381 Bicuspid aortic valve: Secondary | ICD-10-CM | POA: Diagnosis not present

## 2023-11-09 DIAGNOSIS — I351 Nonrheumatic aortic (valve) insufficiency: Secondary | ICD-10-CM | POA: Diagnosis not present

## 2023-11-13 DIAGNOSIS — I351 Nonrheumatic aortic (valve) insufficiency: Secondary | ICD-10-CM | POA: Diagnosis not present

## 2023-11-13 DIAGNOSIS — Z860100 Personal history of colon polyps, unspecified: Secondary | ICD-10-CM | POA: Diagnosis not present

## 2023-11-13 DIAGNOSIS — M519 Unspecified thoracic, thoracolumbar and lumbosacral intervertebral disc disorder: Secondary | ICD-10-CM | POA: Diagnosis not present

## 2023-11-13 DIAGNOSIS — E785 Hyperlipidemia, unspecified: Secondary | ICD-10-CM | POA: Diagnosis not present

## 2023-11-13 DIAGNOSIS — Z85828 Personal history of other malignant neoplasm of skin: Secondary | ICD-10-CM | POA: Diagnosis not present

## 2023-11-13 DIAGNOSIS — K219 Gastro-esophageal reflux disease without esophagitis: Secondary | ICD-10-CM | POA: Diagnosis not present

## 2023-11-13 DIAGNOSIS — Q2381 Bicuspid aortic valve: Secondary | ICD-10-CM | POA: Diagnosis not present

## 2023-11-13 DIAGNOSIS — F32A Depression, unspecified: Secondary | ICD-10-CM | POA: Diagnosis not present

## 2023-11-13 DIAGNOSIS — Z556 Problems related to health literacy: Secondary | ICD-10-CM | POA: Diagnosis not present

## 2023-11-13 DIAGNOSIS — M1990 Unspecified osteoarthritis, unspecified site: Secondary | ICD-10-CM | POA: Diagnosis not present

## 2023-11-13 DIAGNOSIS — S42111D Displaced fracture of body of scapula, right shoulder, subsequent encounter for fracture with routine healing: Secondary | ICD-10-CM | POA: Diagnosis not present

## 2023-11-13 DIAGNOSIS — S22038D Other fracture of third thoracic vertebra, subsequent encounter for fracture with routine healing: Secondary | ICD-10-CM | POA: Diagnosis not present

## 2023-11-13 DIAGNOSIS — F0393 Unspecified dementia, unspecified severity, with mood disturbance: Secondary | ICD-10-CM | POA: Diagnosis not present

## 2023-11-13 DIAGNOSIS — G629 Polyneuropathy, unspecified: Secondary | ICD-10-CM | POA: Diagnosis not present

## 2023-11-13 DIAGNOSIS — W1830XD Fall on same level, unspecified, subsequent encounter: Secondary | ICD-10-CM | POA: Diagnosis not present

## 2023-11-13 DIAGNOSIS — I10 Essential (primary) hypertension: Secondary | ICD-10-CM | POA: Diagnosis not present

## 2023-11-13 DIAGNOSIS — S22048D Other fracture of fourth thoracic vertebra, subsequent encounter for fracture with routine healing: Secondary | ICD-10-CM | POA: Diagnosis not present

## 2023-11-13 DIAGNOSIS — F0394 Unspecified dementia, unspecified severity, with anxiety: Secondary | ICD-10-CM | POA: Diagnosis not present

## 2023-11-13 DIAGNOSIS — D649 Anemia, unspecified: Secondary | ICD-10-CM | POA: Diagnosis not present

## 2023-11-13 DIAGNOSIS — Z8701 Personal history of pneumonia (recurrent): Secondary | ICD-10-CM | POA: Diagnosis not present

## 2023-11-13 DIAGNOSIS — S2241XD Multiple fractures of ribs, right side, subsequent encounter for fracture with routine healing: Secondary | ICD-10-CM | POA: Diagnosis not present

## 2023-11-13 DIAGNOSIS — E559 Vitamin D deficiency, unspecified: Secondary | ICD-10-CM | POA: Diagnosis not present

## 2023-11-13 DIAGNOSIS — K529 Noninfective gastroenteritis and colitis, unspecified: Secondary | ICD-10-CM | POA: Diagnosis not present

## 2023-11-13 DIAGNOSIS — G43909 Migraine, unspecified, not intractable, without status migrainosus: Secondary | ICD-10-CM | POA: Diagnosis not present

## 2023-11-13 DIAGNOSIS — E876 Hypokalemia: Secondary | ICD-10-CM | POA: Diagnosis not present

## 2023-11-15 DIAGNOSIS — R7301 Impaired fasting glucose: Secondary | ICD-10-CM | POA: Diagnosis not present

## 2023-11-15 DIAGNOSIS — G309 Alzheimer's disease, unspecified: Secondary | ICD-10-CM | POA: Diagnosis not present

## 2023-11-15 DIAGNOSIS — I10 Essential (primary) hypertension: Secondary | ICD-10-CM | POA: Diagnosis not present

## 2023-11-15 DIAGNOSIS — D649 Anemia, unspecified: Secondary | ICD-10-CM | POA: Diagnosis not present

## 2023-11-15 DIAGNOSIS — Z23 Encounter for immunization: Secondary | ICD-10-CM | POA: Diagnosis not present

## 2023-11-15 DIAGNOSIS — M810 Age-related osteoporosis without current pathological fracture: Secondary | ICD-10-CM | POA: Diagnosis not present

## 2023-11-22 DIAGNOSIS — S22038D Other fracture of third thoracic vertebra, subsequent encounter for fracture with routine healing: Secondary | ICD-10-CM | POA: Diagnosis not present

## 2023-11-23 DIAGNOSIS — E559 Vitamin D deficiency, unspecified: Secondary | ICD-10-CM | POA: Diagnosis not present

## 2023-11-23 DIAGNOSIS — S2241XD Multiple fractures of ribs, right side, subsequent encounter for fracture with routine healing: Secondary | ICD-10-CM | POA: Diagnosis not present

## 2023-11-23 DIAGNOSIS — E785 Hyperlipidemia, unspecified: Secondary | ICD-10-CM | POA: Diagnosis not present

## 2023-11-23 DIAGNOSIS — M1990 Unspecified osteoarthritis, unspecified site: Secondary | ICD-10-CM | POA: Diagnosis not present

## 2023-11-23 DIAGNOSIS — F0393 Unspecified dementia, unspecified severity, with mood disturbance: Secondary | ICD-10-CM | POA: Diagnosis not present

## 2023-11-23 DIAGNOSIS — F32A Depression, unspecified: Secondary | ICD-10-CM | POA: Diagnosis not present

## 2023-11-23 DIAGNOSIS — G629 Polyneuropathy, unspecified: Secondary | ICD-10-CM | POA: Diagnosis not present

## 2023-11-23 DIAGNOSIS — Z8701 Personal history of pneumonia (recurrent): Secondary | ICD-10-CM | POA: Diagnosis not present

## 2023-11-23 DIAGNOSIS — Z85828 Personal history of other malignant neoplasm of skin: Secondary | ICD-10-CM | POA: Diagnosis not present

## 2023-11-23 DIAGNOSIS — F0394 Unspecified dementia, unspecified severity, with anxiety: Secondary | ICD-10-CM | POA: Diagnosis not present

## 2023-11-23 DIAGNOSIS — Q2381 Bicuspid aortic valve: Secondary | ICD-10-CM | POA: Diagnosis not present

## 2023-11-23 DIAGNOSIS — K529 Noninfective gastroenteritis and colitis, unspecified: Secondary | ICD-10-CM | POA: Diagnosis not present

## 2023-11-23 DIAGNOSIS — W1830XD Fall on same level, unspecified, subsequent encounter: Secondary | ICD-10-CM | POA: Diagnosis not present

## 2023-11-23 DIAGNOSIS — I10 Essential (primary) hypertension: Secondary | ICD-10-CM | POA: Diagnosis not present

## 2023-11-23 DIAGNOSIS — D649 Anemia, unspecified: Secondary | ICD-10-CM | POA: Diagnosis not present

## 2023-11-23 DIAGNOSIS — Z556 Problems related to health literacy: Secondary | ICD-10-CM | POA: Diagnosis not present

## 2023-11-23 DIAGNOSIS — S22038D Other fracture of third thoracic vertebra, subsequent encounter for fracture with routine healing: Secondary | ICD-10-CM | POA: Diagnosis not present

## 2023-11-23 DIAGNOSIS — Z860100 Personal history of colon polyps, unspecified: Secondary | ICD-10-CM | POA: Diagnosis not present

## 2023-11-23 DIAGNOSIS — G43909 Migraine, unspecified, not intractable, without status migrainosus: Secondary | ICD-10-CM | POA: Diagnosis not present

## 2023-11-23 DIAGNOSIS — S42111D Displaced fracture of body of scapula, right shoulder, subsequent encounter for fracture with routine healing: Secondary | ICD-10-CM | POA: Diagnosis not present

## 2023-11-23 DIAGNOSIS — M519 Unspecified thoracic, thoracolumbar and lumbosacral intervertebral disc disorder: Secondary | ICD-10-CM | POA: Diagnosis not present

## 2023-11-23 DIAGNOSIS — E876 Hypokalemia: Secondary | ICD-10-CM | POA: Diagnosis not present

## 2023-11-23 DIAGNOSIS — S22048D Other fracture of fourth thoracic vertebra, subsequent encounter for fracture with routine healing: Secondary | ICD-10-CM | POA: Diagnosis not present

## 2023-11-23 DIAGNOSIS — I351 Nonrheumatic aortic (valve) insufficiency: Secondary | ICD-10-CM | POA: Diagnosis not present

## 2023-11-23 DIAGNOSIS — K219 Gastro-esophageal reflux disease without esophagitis: Secondary | ICD-10-CM | POA: Diagnosis not present

## 2023-11-30 ENCOUNTER — Ambulatory Visit: Payer: Medicare Other | Admitting: Orthopaedic Surgery

## 2023-11-30 DIAGNOSIS — S42111A Displaced fracture of body of scapula, right shoulder, initial encounter for closed fracture: Secondary | ICD-10-CM | POA: Diagnosis not present

## 2023-11-30 NOTE — Progress Notes (Signed)
 Office Visit Note   Patient: Bailey [name]           Date of Birth: 25-May-1941           MRN: [id number] Visit Date: 11/30/2023              Requested by: Shayne Anes, MD [address],  [place] [address] PCP: Shayne Anes, MD   Assessment & Plan: Visit Diagnoses:  1. Closed displaced fracture of body of right scapula, initial encounter     Plan: [name] is a 83 year old female 43-month status post right scapular body fracture.  She is doing great and reports no problems or pain with the shoulder.  Her function has basically returned to baseline.  She does not need physical therapy from my standpoint.  Will see her back as needed.  Follow-Up Instructions: No follow-ups on file.   Orders:  No orders of the defined types were placed in this encounter.  No orders of the defined types were placed in this encounter.     Procedures: No procedures performed   Clinical Data: No additional findings.   Subjective: Chief Complaint  Patient presents with   Right Shoulder - Pain    DOI 10/11/2023    HPI Bailey [name] is a 83 year old female here for follow-up evaluation of a right scapular body fracture.  She had an unwitnessed fall at home and was found down by her friend on December 19.  She presented to the ED that evening.  Had x-rays done that showed a minimally displaced right scapular body fracture.  This is her first follow-up appointment since the injury.  She has been doing well.  She reports no problems with the shoulder.  Review of Systems  Constitutional: Negative.   HENT: Negative.    Eyes: Negative.   Respiratory: Negative.    Cardiovascular: Negative.   Endocrine: Negative.   Musculoskeletal: Negative.   Neurological: Negative.   Hematological: Negative.   Psychiatric/Behavioral: Negative.    All other systems reviewed and are negative.    Objective: Vital Signs: LMP 06/23/2010 (Approximate)   Physical Exam Vitals and nursing note reviewed.   Constitutional:      Appearance: She is well-developed.  HENT:     Head: Atraumatic.     Nose: Nose normal.  Eyes:     Extraocular Movements: Extraocular movements intact.  Cardiovascular:     Pulses: Normal pulses.  Pulmonary:     Effort: Pulmonary effort is normal.  Abdominal:     Palpations: Abdomen is soft.  Musculoskeletal:     Cervical back: Neck supple.  Skin:    General: Skin is warm.     Capillary Refill: Capillary refill takes less than 2 seconds.  Neurological:     Mental Status: She is alert. Mental status is at baseline.  Psychiatric:        Behavior: Behavior normal.        Thought Content: Thought content normal.        Judgment: Judgment normal.     Ortho Exam Examination of the right shoulder shows full active and passive range of motion symmetric to the contralateral shoulder.  She has good functional strength. Specialty Comments:  No specialty comments available.  Imaging: No results found.   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   Diarrhea 01/06/2013   Nausea with vomiting 01/06/2013   Abdominal pain, right upper quadrant 01/06/2013   Palpitations 01/06/2013   Bright red blood per rectum 01/06/2013   Dehydration 01/06/2013   Hypokalemia 01/06/2013   Hyponatremia 01/06/2013   Acute renal failure (HCC) 01/06/2013   Migraine 03/14/2012   Dizziness 03/14/2012   Bicuspid aortic valve 02/27/2012   HTN (hypertension) 02/27/2012   Hyperlipidemia 02/27/2012   Past Medical History:  Diagnosis Date   Anxiety    Aortic insufficiency    Arthritis    hands (01/06/2013)   Basal cell carcinoma of skin    Bicuspid aortic valve     Bicuspid aortic valve    Cataract    right   Colitis, collagenous summer of 2013   Colon polyp    DDD (degenerative disc disease) 12/2009   Dementia (HCC)    GERD (gastroesophageal reflux disease)    /endoscopy; doesn't bother me at all (01/06/2013)   Hard of hearing    bilat   Heart murmur    Hx of migraines    Hyperlipidemia    Hypertension    Left facial numbness    previous, not current   Migraine    used to have once/month; none in years (01/06/2013)   Palpitations    Pneumonia ~ 1944; ~ 2009   Tinnitus    Vertigo     Family History  Problem Relation Age of Onset   Bone cancer Father    Hypertension Father    Thyroid  disease Father        hypo   Brain cancer Brother    Arrhythmia Mother    Osteoporosis Mother    Cancer Mother        colon   Diabetes Mother    Hypertension Mother     Past Surgical History:  Procedure Laterality Date   CATARACT EXTRACTION EXTRACAPSULAR Bilateral 2015   COLONOSCOPY N/A 01/07/2013   Procedure: COLONOSCOPY;  Surgeon: Lamar LULLA Bunk, MD;  Location: [facility] ENDOSCOPY;  Service: Endoscopy;  Laterality: N/A;  The colonoscopy will be unprepped   DILATION AND CURETTAGE OF UTERUS  2002   w/hysteroscopy   ESOPHAGOGASTRODUODENOSCOPY N/A 01/07/2013   Procedure: ESOPHAGOGASTRODUODENOSCOPY (EGD);  Surgeon: Lamar LULLA Bunk, MD;  Location: [facility] ENDOSCOPY;  Service: Endoscopy;  Laterality: N/A;   HYSTEROSCOPY  2002   D&C(sub fibroid)    HYSTEROSCOPY  2006   resection polyps D&C   THYROIDECTOMY, PARTIAL     removed gland from left side of neck that was to go down and past thyroid  in front (01/06/2013)   TONSILLECTOMY     when I was little (01/06/2013)   TUBAL LIGATION Bilateral 1970's   TUMOR REMOVAL  1990's & 06/23/2012, 02/2014   benign; roof of mouth (01/06/2013)   Social History   Occupational History   Not on file  Tobacco Use   Smoking status: Never   Smokeless tobacco: Never  Vaping Use   Vaping status: Never Used  Substance and  Sexual Activity   Alcohol  use: No    Alcohol /week: 0.0 standard drinks of alcohol    Drug use: No   Sexual activity: Yes    Partners: Male    Birth control/protection: Post-menopausal

## 2023-12-07 DIAGNOSIS — F0393 Unspecified dementia, unspecified severity, with mood disturbance: Secondary | ICD-10-CM | POA: Diagnosis not present

## 2023-12-07 DIAGNOSIS — Z860100 Personal history of colon polyps, unspecified: Secondary | ICD-10-CM | POA: Diagnosis not present

## 2023-12-07 DIAGNOSIS — G629 Polyneuropathy, unspecified: Secondary | ICD-10-CM | POA: Diagnosis not present

## 2023-12-07 DIAGNOSIS — I10 Essential (primary) hypertension: Secondary | ICD-10-CM | POA: Diagnosis not present

## 2023-12-07 DIAGNOSIS — M519 Unspecified thoracic, thoracolumbar and lumbosacral intervertebral disc disorder: Secondary | ICD-10-CM | POA: Diagnosis not present

## 2023-12-07 DIAGNOSIS — Z85828 Personal history of other malignant neoplasm of skin: Secondary | ICD-10-CM | POA: Diagnosis not present

## 2023-12-07 DIAGNOSIS — I351 Nonrheumatic aortic (valve) insufficiency: Secondary | ICD-10-CM | POA: Diagnosis not present

## 2023-12-07 DIAGNOSIS — F32A Depression, unspecified: Secondary | ICD-10-CM | POA: Diagnosis not present

## 2023-12-07 DIAGNOSIS — S22038D Other fracture of third thoracic vertebra, subsequent encounter for fracture with routine healing: Secondary | ICD-10-CM | POA: Diagnosis not present

## 2023-12-07 DIAGNOSIS — S42111D Displaced fracture of body of scapula, right shoulder, subsequent encounter for fracture with routine healing: Secondary | ICD-10-CM | POA: Diagnosis not present

## 2023-12-07 DIAGNOSIS — Q2381 Bicuspid aortic valve: Secondary | ICD-10-CM | POA: Diagnosis not present

## 2023-12-07 DIAGNOSIS — F0394 Unspecified dementia, unspecified severity, with anxiety: Secondary | ICD-10-CM | POA: Diagnosis not present

## 2023-12-07 DIAGNOSIS — G43909 Migraine, unspecified, not intractable, without status migrainosus: Secondary | ICD-10-CM | POA: Diagnosis not present

## 2023-12-07 DIAGNOSIS — E876 Hypokalemia: Secondary | ICD-10-CM | POA: Diagnosis not present

## 2023-12-07 DIAGNOSIS — Z556 Problems related to health literacy: Secondary | ICD-10-CM | POA: Diagnosis not present

## 2023-12-07 DIAGNOSIS — S2241XD Multiple fractures of ribs, right side, subsequent encounter for fracture with routine healing: Secondary | ICD-10-CM | POA: Diagnosis not present

## 2023-12-07 DIAGNOSIS — E559 Vitamin D deficiency, unspecified: Secondary | ICD-10-CM | POA: Diagnosis not present

## 2023-12-07 DIAGNOSIS — S22048D Other fracture of fourth thoracic vertebra, subsequent encounter for fracture with routine healing: Secondary | ICD-10-CM | POA: Diagnosis not present

## 2023-12-07 DIAGNOSIS — E785 Hyperlipidemia, unspecified: Secondary | ICD-10-CM | POA: Diagnosis not present

## 2023-12-07 DIAGNOSIS — D649 Anemia, unspecified: Secondary | ICD-10-CM | POA: Diagnosis not present

## 2023-12-07 DIAGNOSIS — Z8701 Personal history of pneumonia (recurrent): Secondary | ICD-10-CM | POA: Diagnosis not present

## 2023-12-07 DIAGNOSIS — W1830XD Fall on same level, unspecified, subsequent encounter: Secondary | ICD-10-CM | POA: Diagnosis not present

## 2023-12-07 DIAGNOSIS — M1990 Unspecified osteoarthritis, unspecified site: Secondary | ICD-10-CM | POA: Diagnosis not present

## 2023-12-07 DIAGNOSIS — K219 Gastro-esophageal reflux disease without esophagitis: Secondary | ICD-10-CM | POA: Diagnosis not present

## 2023-12-07 DIAGNOSIS — K529 Noninfective gastroenteritis and colitis, unspecified: Secondary | ICD-10-CM | POA: Diagnosis not present

## 2024-01-15 ENCOUNTER — Other Ambulatory Visit (HOSPITAL_COMMUNITY): Payer: Self-pay

## 2024-01-16 ENCOUNTER — Ambulatory Visit (HOSPITAL_COMMUNITY)
Admission: RE | Admit: 2024-01-16 | Discharge: 2024-01-16 | Disposition: A | Discharge status: Home / Self Care | Source: Ambulatory Visit | Attending: Internal Medicine | Admitting: Internal Medicine

## 2024-01-16 DIAGNOSIS — M810 Age-related osteoporosis without current pathological fracture: Secondary | ICD-10-CM | POA: Diagnosis not present

## 2024-01-16 MED ORDER — DENOSUMAB 60 MG/ML ~~LOC~~ SOSY
60.0000 mg | PREFILLED_SYRINGE | Freq: Once | SUBCUTANEOUS | Status: AC
  Administered 2024-01-16: 60 mg via SUBCUTANEOUS

## 2024-01-16 MED ORDER — DENOSUMAB 60 MG/ML ~~LOC~~ SOSY
PREFILLED_SYRINGE | SUBCUTANEOUS | Status: AC
  Filled 2024-01-16: qty 1

## 2024-01-28 DIAGNOSIS — I10 Essential (primary) hypertension: Secondary | ICD-10-CM | POA: Diagnosis not present

## 2024-02-22 DIAGNOSIS — H524 Presbyopia: Secondary | ICD-10-CM | POA: Diagnosis not present

## 2024-03-13 ENCOUNTER — Emergency Department (HOSPITAL_COMMUNITY)
Admission: EM | Admit: 2024-03-13 | Discharge: 2024-03-14 | Disposition: A | Discharge status: Home / Self Care | Attending: Emergency Medicine | Admitting: Emergency Medicine

## 2024-03-13 ENCOUNTER — Other Ambulatory Visit: Payer: Self-pay

## 2024-03-13 ENCOUNTER — Encounter (HOSPITAL_COMMUNITY): Payer: Self-pay

## 2024-03-13 ENCOUNTER — Emergency Department (HOSPITAL_COMMUNITY)

## 2024-03-13 DIAGNOSIS — Z23 Encounter for immunization: Secondary | ICD-10-CM | POA: Insufficient documentation

## 2024-03-13 DIAGNOSIS — W010XXA Fall on same level from slipping, tripping and stumbling without subsequent striking against object, initial encounter: Secondary | ICD-10-CM | POA: Insufficient documentation

## 2024-03-13 DIAGNOSIS — G9389 Other specified disorders of brain: Secondary | ICD-10-CM | POA: Diagnosis not present

## 2024-03-13 DIAGNOSIS — Y9301 Activity, walking, marching and hiking: Secondary | ICD-10-CM | POA: Diagnosis not present

## 2024-03-13 DIAGNOSIS — M79671 Pain in right foot: Secondary | ICD-10-CM | POA: Diagnosis not present

## 2024-03-13 DIAGNOSIS — S81812A Laceration without foreign body, left lower leg, initial encounter: Secondary | ICD-10-CM | POA: Diagnosis not present

## 2024-03-13 DIAGNOSIS — M4802 Spinal stenosis, cervical region: Secondary | ICD-10-CM | POA: Diagnosis not present

## 2024-03-13 DIAGNOSIS — M79604 Pain in right leg: Secondary | ICD-10-CM | POA: Diagnosis not present

## 2024-03-13 DIAGNOSIS — R609 Edema, unspecified: Secondary | ICD-10-CM | POA: Diagnosis not present

## 2024-03-13 DIAGNOSIS — F0390 Unspecified dementia without behavioral disturbance: Secondary | ICD-10-CM | POA: Diagnosis not present

## 2024-03-13 DIAGNOSIS — M79605 Pain in left leg: Secondary | ICD-10-CM | POA: Diagnosis not present

## 2024-03-13 DIAGNOSIS — S0990XA Unspecified injury of head, initial encounter: Secondary | ICD-10-CM | POA: Diagnosis not present

## 2024-03-13 DIAGNOSIS — Y92009 Unspecified place in unspecified non-institutional (private) residence as the place of occurrence of the external cause: Secondary | ICD-10-CM | POA: Insufficient documentation

## 2024-03-13 DIAGNOSIS — I10 Essential (primary) hypertension: Secondary | ICD-10-CM | POA: Insufficient documentation

## 2024-03-13 DIAGNOSIS — Z79899 Other long term (current) drug therapy: Secondary | ICD-10-CM | POA: Diagnosis not present

## 2024-03-13 DIAGNOSIS — M47812 Spondylosis without myelopathy or radiculopathy, cervical region: Secondary | ICD-10-CM | POA: Diagnosis not present

## 2024-03-13 DIAGNOSIS — W19XXXA Unspecified fall, initial encounter: Secondary | ICD-10-CM

## 2024-03-13 DIAGNOSIS — S8992XA Unspecified injury of left lower leg, initial encounter: Secondary | ICD-10-CM | POA: Diagnosis not present

## 2024-03-13 DIAGNOSIS — Z043 Encounter for examination and observation following other accident: Secondary | ICD-10-CM | POA: Diagnosis not present

## 2024-03-13 DIAGNOSIS — R918 Other nonspecific abnormal finding of lung field: Secondary | ICD-10-CM | POA: Diagnosis not present

## 2024-03-13 MED ORDER — TETANUS-DIPHTH-ACELL PERTUSSIS 5-2.5-18.5 LF-MCG/0.5 IM SUSY
0.5000 mL | PREFILLED_SYRINGE | Freq: Once | INTRAMUSCULAR | Status: AC
  Administered 2024-03-14: 0.5 mL via INTRAMUSCULAR
  Filled 2024-03-13: qty 0.5

## 2024-03-13 NOTE — ED Notes (Signed)
 Daughter reports patient having sever dementia and is unreliable historian.

## 2024-03-13 NOTE — ED Provider Notes (Signed)
 [place] EMERGENCY DEPARTMENT AT [facility] Provider Note   CSN: [id number] Arrival date & time: 03/13/24  2120     History  Chief Complaint  Patient presents with   Bailey [name] is a 83 y.o. female.  The history is provided by the patient and the EMS personnel. The history is limited by the condition of the patient (Dementia).  Fall  She has history of hypertension, hyperlipidemia, dementia and comes in by ambulance after having a fall at home.  Per EMS, she tripped and fell at home, unknown if she hit her head.  She was noted to have skin tears to her legs bilaterally.  Patient has no memory of the incident, and has no complaints.   Home Medications Prior to Admission medications   Medication Sig Start Date End Date Taking? Authorizing Provider  amLODipine  (NORVASC ) 10 MG tablet Take 1 tablet (10 mg total) by mouth daily. 10/19/23 11/18/23  Modena Andes, MD  budesonide  (ENTOCORT EC ) 3 MG 24 hr capsule Take 3-6 mg by mouth See admin instructions. Take 3 mg by mouth once a day, ALTERNATING WITH 6 mg every other day 09/24/18   [provider]  Cholecalciferol  (VITAMIN D3) 125 MCG (5000 UT) TABS Take 5,000 Units by mouth daily.    [provider]  cyanocobalamin  (VITAMIN B12) 1000 MCG tablet Take 1,000 mcg by mouth daily.    [provider]  donepezil  (ARICEPT ) 5 MG tablet Take 5 mg by mouth daily.    [provider]  escitalopram  (LEXAPRO ) 10 MG tablet Take 10 mg by mouth daily.  11/25/18   [provider]  furosemide  (LASIX ) 20 MG tablet Take 20 mg by mouth in the morning. 04/02/21   [provider]  hydrALAZINE  (APRESOLINE ) 25 MG tablet Take 1 tablet (25 mg total) by mouth 3 (three) times daily. 10/18/23 11/17/23  Modena Andes, MD  losartan  (COZAAR ) 100 MG tablet Take 100 mg by mouth daily. 05/09/21   [provider]  metoprolol  succinate (TOPROL -XL) 25 MG 24 hr tablet Take 1 tablet (25 mg total) by  mouth daily. 10/19/23 11/18/23  Modena Andes, MD  oxyCODONE  (OXY IR/ROXICODONE ) 5 MG immediate release tablet Take 1 tablet (5 mg total) by mouth every 4 (four) hours as needed for moderate pain (pain score 4-6). 10/14/23   Modena Andes, MD  pantoprazole  (PROTONIX ) 40 MG tablet Take 40 mg by mouth daily before breakfast. 06/01/21   [provider]  potassium chloride  (KLOR-CON ) 10 MEQ tablet Take 10 mEq by mouth See admin instructions. Take 10 mEq by mouth in the morning and afternoon 10/01/19   [provider]      Allergies    Alendronate sodium, Amlodipine , Aspirin, Cozaar  [losartan ], Exelon [rivastigmine], Fexofenadine, Hydrochlorothiazide , Ibuprofen, Ibuprofen, Lisinopril, Loratadine, Metronidazole, Olmesartan, Ondansetron , Pantoprazole  sodium, and Simvastatin    Review of Systems   Review of Systems  Unable to perform ROS: Dementia    Physical Exam Updated Vital Signs BP (!) 157/74 (BP Location: Right Arm)   Pulse (!) 48   Temp 98.1 F (36.7 C) (Oral)   Resp 17   Ht 5' 2.5" (1.588 m)   Wt 38.4 kg   LMP 06/23/2010 (Approximate)   SpO2 97%   BMI 15.24 kg/m  Physical Exam Vitals and nursing note reviewed.   83 year old female, resting comfortably and in no acute distress. Vital signs are significant for slow heart rate and elevated blood pressure. Oxygen saturation is 97%,  which is normal. Head is normocephalic and atraumatic. PERRLA, EOMI.  Neck is nontender. Back is nontender. Lungs are clear without rales, wheezes, or rhonchi. Chest is nontender. Heart has regular rate and rhythm without murmur. Abdomen is soft, flat, nontender. Extremities: Skin tears are noted of both knees and both lower legs with the largest skin tear in the right lower leg.  Extensive ecchymosis is seen on the lower leg-right greater than left.  Also, ecchymosis is noted over the right foot.  However, there is full passive range of motion of all joints without pain.  There is no  swelling or deformity noted. Neurologic: Awake and alert, oriented to person but not place or time.  Follows commands appropriately, moves all extremities equally.    ED Results / Procedures / Treatments    Radiology DG Tibia/Fibula Right Result Date: 03/14/2024 CLINICAL DATA:  Recent fall with right leg pain, initial encounter EXAM: RIGHT TIBIA AND FIBULA - 2 VIEW COMPARISON:  None Available. FINDINGS: Lateral skin wound is noted consistent with the recent injury. No fracture or dislocation is seen. IMPRESSION: Skin wound without acute bony abnormality. Electronically Signed   By: Violeta Grey M.D.   On: 03/14/2024 00:31   DG Foot Complete Right Result Date: 03/14/2024 CLINICAL DATA:  Recent fall with right foot pain EXAM: RIGHT FOOT COMPLETE - 3+ VIEW COMPARISON:  None Available. FINDINGS: There is no evidence of fracture or dislocation. There is no evidence of arthropathy or other focal bone abnormality. Soft tissues are unremarkable. IMPRESSION: No acute abnormality noted. Electronically Signed   By: Violeta Grey M.D.   On: 03/14/2024 00:30   DG Tibia/Fibula Left Result Date: 03/14/2024 CLINICAL DATA:  Recent fall with left lower leg pain, initial encounter EXAM: LEFT TIBIA AND FIBULA - 2 VIEW COMPARISON:  None Available. FINDINGS: There is no evidence of fracture or other focal bone lesions. Soft tissues are unremarkable. IMPRESSION: No acute abnormality noted. Electronically Signed   By: Violeta Grey M.D.   On: 03/14/2024 00:29   CT Cervical Spine Wo Contrast Result Date: 03/13/2024 CLINICAL DATA:  Status post fall. EXAM: CT CERVICAL SPINE WITHOUT CONTRAST TECHNIQUE: Multidetector CT imaging of the cervical spine was performed without intravenous contrast. Multiplanar CT image reconstructions were also generated. RADIATION DOSE REDUCTION: This exam was performed according to the departmental dose-optimization program which includes automated exposure control, adjustment of the mA and/or kV  according to patient size and/or use of iterative reconstruction technique. COMPARISON:  October 11, 2023 FINDINGS: Alignment: Approximate 2 mm anterolisthesis of the C4 vertebral body is seen on C5. 3 mm retrolisthesis of C5 is noted on C6. Skull base and vertebrae: No acute fracture. No primary bone lesion or focal pathologic process. Soft tissues and spinal canal: No prevertebral fluid or swelling. No visible canal hematoma. Disc levels: Marked severity endplate sclerosis, mild to moderate severity anterior osteophyte formation and posterior bony spurring is seen at the levels of C5-C6 and C6-C7. Mild changes are seen at the levels of C3-C4 C4-C5. Marked severity intervertebral disc space narrowing is seen at C4-C5, C5-C6 and C6-C7 with moderate severity, predominant posterior intervertebral disc space narrowing present at C3-C4. Bilateral marked severity multilevel facet joint hypertrophy is noted. Upper chest: Moderate to marked severity biapical scarring and/or atelectasis is seen. Other: The right and left lobes of the thyroid  gland are heterogeneous in appearance. IMPRESSION: 1. No acute cervical spine fracture. 2. Marked severity multilevel degenerative changes, as described above. 3. Moderate to marked severity biapical scarring  and/or atelectasis. 4. Heterogeneous appearance of the right and left lobes of the thyroid  gland. Correlation with nonemergent thyroid  ultrasound is recommended. Electronically Signed   By: Virgle Grime M.D.   On: 03/13/2024 23:51   CT Head Wo Contrast Result Date: 03/13/2024 CLINICAL DATA:  Status post fall. EXAM: CT HEAD WITHOUT CONTRAST TECHNIQUE: Contiguous axial images were obtained from the base of the skull through the vertex without intravenous contrast. RADIATION DOSE REDUCTION: This exam was performed according to the departmental dose-optimization program which includes automated exposure control, adjustment of the mA and/or kV according to patient size and/or  use of iterative reconstruction technique. COMPARISON:  October 11, 2023 FINDINGS: Brain: There is generalized cerebral atrophy with widening of the extra-axial spaces and ventricular dilatation. There are areas of decreased attenuation within the white matter tracts of the supratentorial brain, consistent with microvascular disease changes. Vascular: No hyperdense vessel or unexpected calcification. Skull: Normal. Negative for fracture or focal lesion. Sinuses/Orbits: No acute finding. Other: None. IMPRESSION: No acute intracranial abnormality. Electronically Signed   By: Virgle Grime M.D.   On: 03/13/2024 23:45    Procedures Procedures  Cardiac monitor shows normal sinus rhythm, per my interpretation.  Medications Ordered in ED Medications  Tdap (BOOSTRIX) injection 0.5 mL (0.5 mLs Intramuscular Given 03/14/24 0101)   ED Course/ Medical Decision Making/ A&P                                 Medical Decision Making Amount and/or Complexity of Data Reviewed Radiology: ordered.  Risk Prescription drug management.   Fall with injury to legs which seems mostly to be bruising and skin tears.  Because of mechanism of injury, I have ordered CT of head and cervical spine.  I have low index of suspicion of bony injury to the legs but I have ordered x-rays.  I can find no record of tetanus immunization in the chart and patient is not a reliable historian, so I have ordered a Tdap booster.  CT scans and x-rays showed no evidence of acute injury other than the known skin tears.  I have independently viewed all of the images, and agree with the radiologist's interpretation.  I am discharging her back home with instructions to continue routine wound care.  Final Clinical Impression(s) / ED Diagnoses Final diagnoses:  Fall in home, initial encounter  Skin tear of left lower leg without complication, initial encounter    Rx / DC Orders ED Discharge Orders     None         Alissa April,  MD 03/14/24 [phone]

## 2024-03-13 NOTE — ED Triage Notes (Signed)
 Pt comes from home where husband reports to EMS she tripped and fell while walking to mailbox. Pt at baseline only alert to self and place. Unknown if pt hit head. Pt noted to have skin tear to lower R leg and lower L leg. Bleeding controlled by ABD pad placed by EMS. Small abrasions noted to top of bilateral feet as well.

## 2024-03-14 ENCOUNTER — Emergency Department (HOSPITAL_COMMUNITY)

## 2024-03-14 DIAGNOSIS — L97912 Non-pressure chronic ulcer of unspecified part of right lower leg with fat layer exposed: Secondary | ICD-10-CM | POA: Diagnosis not present

## 2024-03-14 DIAGNOSIS — M79671 Pain in right foot: Secondary | ICD-10-CM | POA: Diagnosis not present

## 2024-03-14 DIAGNOSIS — M79604 Pain in right leg: Secondary | ICD-10-CM | POA: Diagnosis not present

## 2024-03-14 DIAGNOSIS — M79605 Pain in left leg: Secondary | ICD-10-CM | POA: Diagnosis not present

## 2024-03-14 NOTE — ED Notes (Addendum)
 This tech is sitting with patient for safety observation. Pt continues to be confused and require redirection. Pt remains in bed at this time.

## 2024-03-14 NOTE — ED Notes (Signed)
 Patient was given coffee and graham crackers.

## 2024-03-14 NOTE — ED Notes (Signed)
 Patient wandered out of room, redirected patient back to room.

## 2024-03-14 NOTE — Discharge Instructions (Addendum)
 You will need to change the dressings on the skin tears of your legs once a day.  Please apply an antibiotic ointment such as bacitracin  or Neosporin and then reapply a dressing.  It can take several weeks for these to fully heal.  You may take ibuprofen or acetaminophen  as needed for pain.

## 2024-03-14 NOTE — ED Notes (Signed)
 Pt continues to ask for husband/to call husband. She has been reminded he has spoken to her nurse and she is safe.

## 2024-03-14 NOTE — ED Notes (Signed)
 Pt has remained in bed and been redirected with conversation. Dr. Candelaria Chaco let patient know she was cleared to go home, awaiting discharge orders/transportation.

## 2024-03-14 NOTE — ED Notes (Signed)
 CN notified of need for safety sitter, due to patient wandering and high fall risk

## 2024-03-28 DIAGNOSIS — L97922 Non-pressure chronic ulcer of unspecified part of left lower leg with fat layer exposed: Secondary | ICD-10-CM | POA: Diagnosis not present

## 2024-03-28 DIAGNOSIS — L97912 Non-pressure chronic ulcer of unspecified part of right lower leg with fat layer exposed: Secondary | ICD-10-CM | POA: Diagnosis not present

## 2024-03-28 DIAGNOSIS — L03115 Cellulitis of right lower limb: Secondary | ICD-10-CM | POA: Diagnosis not present

## 2024-04-03 DIAGNOSIS — L97922 Non-pressure chronic ulcer of unspecified part of left lower leg with fat layer exposed: Secondary | ICD-10-CM | POA: Diagnosis not present

## 2024-04-03 DIAGNOSIS — L97912 Non-pressure chronic ulcer of unspecified part of right lower leg with fat layer exposed: Secondary | ICD-10-CM | POA: Diagnosis not present

## 2024-04-09 ENCOUNTER — Telehealth: Payer: Self-pay

## 2024-04-09 DIAGNOSIS — L97912 Non-pressure chronic ulcer of unspecified part of right lower leg with fat layer exposed: Secondary | ICD-10-CM | POA: Diagnosis not present

## 2024-04-09 DIAGNOSIS — R7301 Impaired fasting glucose: Secondary | ICD-10-CM | POA: Diagnosis not present

## 2024-04-09 NOTE — Telephone Encounter (Signed)
 PA Norlin Beck for [facility] referral for pt to see Dr. Julio Ohm for non healing wound 9x3 cm of the LE. Debrided today in their office and will call to sch appt for Monday.

## 2024-04-09 NOTE — Telephone Encounter (Signed)
 Maureen Sour [phone] care giver appt sch for Monday at 10:45

## 2024-04-10 DIAGNOSIS — K52831 Collagenous colitis: Secondary | ICD-10-CM | POA: Diagnosis not present

## 2024-04-10 DIAGNOSIS — K9089 Other intestinal malabsorption: Secondary | ICD-10-CM | POA: Diagnosis not present

## 2024-04-14 ENCOUNTER — Ambulatory Visit: Admitting: Orthopedic Surgery

## 2024-04-14 ENCOUNTER — Encounter: Payer: Self-pay | Admitting: Orthopedic Surgery

## 2024-04-14 DIAGNOSIS — L97911 Non-pressure chronic ulcer of unspecified part of right lower leg limited to breakdown of skin: Secondary | ICD-10-CM

## 2024-04-14 NOTE — Progress Notes (Signed)
 Office Visit Note   Patient: Bailey [name]           Date of Birth: [birth year]           MRN: [id number] Visit Date: 04/14/2024              Requested by: Shayne Anes, MD [address] [place],  [place] [address] PCP: Shayne Anes, MD  Chief Complaint  Patient presents with   Left Leg - Open Wound      HPI: Patient is an 83 year old woman who is seen for initial evaluation for traumatic wound right leg.  Patient fell approximately 1 month ago sustaining the open wound to the right leg on May 22.  Patient has undergone excellent conservative therapy with compression wraps without resolution of the ulcer.  Past medical history negative for diabetes negative for peripheral vascular disease.  She is not on immunosuppressive medication and is not on blood thinners.  Assessment & Plan: Visit Diagnoses:  1. Ulcer of right leg, limited to breakdown of skin (HCC)     Plan: With the deep necrotic ulcer I have recommended to the family and the caregiver to proceed with surgical debridement of the wound and application of Kerecis tissue graft and obtain soft tissue cultures.  Anticipate patient would be admitted after surgery and plan for discharge to home once the wound is stabilized with a Prevena plus portable wound VAC pump.  Family states they understand wish to proceed at this time, family will need to be with patient for surgical consent on Wednesday.  Follow-Up Instructions: No follow-ups on file.   Ortho Exam  Patient is alert, oriented, no adenopathy, well-dressed, normal affect, normal respiratory effort. Examination of the right lower extremity patient does have varicose veins.  She has a wound that is 3 x 5 cm and a wound just inferior that is 1 x 2 cm.  This is over the tibial crest.  After informed consent a 10 blade knife was used to debride the necrotic tissue and the wound bed still had nonhealthy not viable tissue despite surgical debridement.  Wound measurements were  unchanged after debridement.  A Vashe compressive dressing was applied.    Imaging: No results found.   Labs: Lab Results  Component Value Date   HGBA1C 5.1 08/15/2017   REPTSTATUS 01/10/2013 FINAL 01/06/2013   CULT  01/06/2013    NO SALMONELLA, SHIGELLA, CAMPYLOBACTER, YERSINIA, OR E.COLI 0157:H7 ISOLATED   LABORGA NO GROWTH 08/03/2015     Lab Results  Component Value Date   ALBUMIN 3.8 06/13/2021   ALBUMIN 2.8 (L) 01/08/2013   ALBUMIN 3.5 01/06/2013    No results found for: MG No results found for: VD25OH  No results found for: PREALBUMIN    Latest Ref Rng & Units 10/18/2023    4:37 AM 10/14/2023    5:29 AM 10/12/2023    4:28 AM  CBC EXTENDED  WBC 4.0 - 10.5 K/uL 9.4  7.3  8.1   RBC 3.87 - 5.11 MIL/uL 3.65  3.85  3.68   Hemoglobin 12.0 - 15.0 g/dL 88.9  88.5  88.9   HCT 36.0 - 46.0 % 33.7  35.9  35.0   Platelets 150 - 400 K/uL 235  162  172   NEUT# 1.7 - 7.7 K/uL 4.9     Lymph# 0.7 - 4.0 K/uL 3.1        There is no height or weight on file to calculate BMI.  Orders:  No orders of  the defined types were placed in this encounter.  No orders of the defined types were placed in this encounter.    Procedures: No procedures performed  Clinical Data: No additional findings.  ROS:  All other systems negative, except as noted in the HPI. Review of Systems  Objective: Vital Signs: LMP 06/23/2010 (Approximate)   Specialty Comments:  No specialty comments available.  PMFS History: Patient Active Problem List   Diagnosis Date Noted   Closed right scapular fracture 10/12/2023   Compression fracture of body of thoracic vertebra (HCC) 10/12/2023   Fall with injury 10/11/2023   Aortic insufficiency 09/06/2017   Peripheral neuropathy 08/15/2017   Tinnitus 05/18/2014   Pleomorphic adenoma of minor salivary gland 01/13/2014   Diarrhea 01/06/2013   Nausea with vomiting 01/06/2013   Abdominal pain, right upper quadrant 01/06/2013   Palpitations  01/06/2013   Bright red blood per rectum 01/06/2013   Dehydration 01/06/2013   Hypokalemia 01/06/2013   Hyponatremia 01/06/2013   Acute renal failure (HCC) 01/06/2013   Migraine 03/14/2012   Dizziness 03/14/2012   Bicuspid aortic valve 02/27/2012   HTN (hypertension) 02/27/2012   Hyperlipidemia 02/27/2012   Past Medical History:  Diagnosis Date   Anxiety    Aortic insufficiency    Arthritis    hands (01/06/2013)   Basal cell carcinoma of skin    Bicuspid aortic valve    Bicuspid aortic valve    Cataract    right   Colitis, collagenous summer of 2013   Colon polyp    DDD (degenerative disc disease) 12/2009   Dementia (HCC)    GERD (gastroesophageal reflux disease)    /endoscopy; doesn't bother me at all (01/06/2013)   Hard of hearing    bilat   Heart murmur    Hx of migraines    Hyperlipidemia    Hypertension    Left facial numbness    previous, not current   Migraine    used to have once/month; none in years (01/06/2013)   Palpitations    Pneumonia ~ 1944; ~ 2009   Tinnitus    Vertigo     Family History  Problem Relation Age of Onset   Bone cancer Father    Hypertension Father    Thyroid  disease Father        hypo   Brain cancer Brother    Arrhythmia Mother    Osteoporosis Mother    Cancer Mother        colon   Diabetes Mother    Hypertension Mother     Past Surgical History:  Procedure Laterality Date   CATARACT EXTRACTION EXTRACAPSULAR Bilateral 2015   COLONOSCOPY N/A 01/07/2013   Procedure: COLONOSCOPY;  Surgeon: Lamar LULLA Bunk, MD;  Location: [facility] ENDOSCOPY;  Service: Endoscopy;  Laterality: N/A;  The colonoscopy will be unprepped   DILATION AND CURETTAGE OF UTERUS  2002   w/hysteroscopy   ESOPHAGOGASTRODUODENOSCOPY N/A 01/07/2013   Procedure: ESOPHAGOGASTRODUODENOSCOPY (EGD);  Surgeon: Lamar LULLA Bunk, MD;  Location: [facility] ENDOSCOPY;  Service: Endoscopy;  Laterality: N/A;   HYSTEROSCOPY  2002   D&C(sub fibroid)    HYSTEROSCOPY  2006    resection polyps D&C   THYROIDECTOMY, PARTIAL     removed gland from left side of neck that was to go down and past thyroid  in front (01/06/2013)   TONSILLECTOMY     when I was little (01/06/2013)   TUBAL LIGATION Bilateral 1970's   TUMOR REMOVAL  1990's & 06/23/2012, 02/2014   benign; roof of mouth (  01/06/2013)   Social History   Occupational History   Not on file  Tobacco Use   Smoking status: Never   Smokeless tobacco: Never  Vaping Use   Vaping status: Never Used  Substance and Sexual Activity   Alcohol  use: No    Alcohol /week: 0.0 standard drinks of alcohol    Drug use: No   Sexual activity: Yes    Partners: Male    Birth control/protection: Post-menopausal

## 2024-04-15 ENCOUNTER — Other Ambulatory Visit: Payer: Self-pay

## 2024-04-15 ENCOUNTER — Encounter (HOSPITAL_COMMUNITY): Payer: Self-pay | Admitting: Orthopedic Surgery

## 2024-04-15 NOTE — H&P (Signed)
 Bailey [name] is an 83 y.o. female.   Chief Complaint: traumatic wound right leg  HPI:  Patient is an 83 year old woman who is seen for initial evaluation for traumatic wound right leg.  Patient fell approximately 1 month ago sustaining the open wound to the right leg on May 22.  Patient has undergone excellent conservative therapy with compression wraps without resolution of the ulcer.   Past medical history negative for diabetes negative for peripheral vascular disease.  She is not on immunosuppressive medication and is not on blood thinners.  PMhx:hypertension, hyperlipidemia, and dementia    Past Medical History:  Diagnosis Date   Anxiety    Aortic insufficiency    Arthritis    hands (01/06/2013)   Basal cell carcinoma of skin    Bicuspid aortic valve    Bicuspid aortic valve    Cataract    right   Colitis, collagenous summer of 2013   Colon polyp    DDD (degenerative disc disease) 12/2009   Dementia (HCC)    GERD (gastroesophageal reflux disease)    /endoscopy; doesn't bother me at all (01/06/2013)   Hard of hearing    bilat   Heart murmur    Hx of migraines    Hyperlipidemia    Hypertension    Left facial numbness    previous, not current   Migraine    used to have once/month; none in years (01/06/2013)   Palpitations    Pneumonia ~ 1944; ~ 2009   Tinnitus    Vertigo     Past Surgical History:  Procedure Laterality Date   CATARACT EXTRACTION EXTRACAPSULAR Bilateral 2015   COLONOSCOPY N/A 01/07/2013   Procedure: COLONOSCOPY;  Surgeon: Lamar LULLA Bunk, MD;  Location: [facility] ENDOSCOPY;  Service: Endoscopy;  Laterality: N/A;  The colonoscopy will be unprepped   DILATION AND CURETTAGE OF UTERUS  2002   w/hysteroscopy   ESOPHAGOGASTRODUODENOSCOPY N/A 01/07/2013   Procedure: ESOPHAGOGASTRODUODENOSCOPY (EGD);  Surgeon: Lamar LULLA Bunk, MD;  Location: [facility] ENDOSCOPY;  Service: Endoscopy;  Laterality: N/A;   HYSTEROSCOPY  2002   D&C(sub fibroid)    HYSTEROSCOPY  2006    resection polyps D&C   THYROIDECTOMY, PARTIAL     removed gland from left side of neck that was to go down and past thyroid  in front (01/06/2013)   TONSILLECTOMY     when I was little (01/06/2013)   TUBAL LIGATION Bilateral 1970's   TUMOR REMOVAL  1990's & 06/23/2012, 02/2014   benign; roof of mouth (01/06/2013)    Family History  Problem Relation Age of Onset   Bone cancer Father    Hypertension Father    Thyroid  disease Father        hypo   Brain cancer Brother    Arrhythmia Mother    Osteoporosis Mother    Cancer Mother        colon   Diabetes Mother    Hypertension Mother    Social History:  reports that she has never smoked. She has never used smokeless tobacco. She reports that she does not drink alcohol  and does not use drugs.  Allergies:  Allergies  Allergen Reactions   Alendronate Sodium Other (See Comments)    Made the hips/legs hurt   Amlodipine  Other (See Comments)    Feet swelling and hot flashes   Aspirin Other (See Comments)    Scleral hemorrhages   Cozaar  [Losartan ] Other (See Comments)    Face broke out   Exelon [Rivastigmine] Other (See Comments)  Just felt badly when taking it   Fexofenadine Other (See Comments)    Made her aware of heart beating   Hydrochlorothiazide  Other (See Comments)    Felt it contributed to hair falling out 7/12   Ibuprofen Nausea And Vomiting and Other (See Comments)    Vertigo, also    Ibuprofen Nausea And Vomiting and Other (See Comments)    Also, Dizziness, GI Intolerance   Lisinopril Other (See Comments)    Hair was falling out on it   Loratadine Other (See Comments)    Unknown   Metronidazole Diarrhea, Nausea And Vomiting and Other (See Comments)    GI Intolerance   Olmesartan Other (See Comments)    I wonder if it contributed to her enteropathy in 2014. so will never give this again.   Ondansetron  Nausea And Vomiting and Other (See Comments)    GI Intolerance   Pantoprazole  Sodium Other (See  Comments)    Bloating   Simvastatin Nausea And Vomiting and Other (See Comments)    Stomach upset    No medications prior to admission.    No results found for this or any previous visit (from the past 48 hours). No results found.  Review of Systems  All other systems reviewed and are negative.   Last menstrual period 06/23/2010. Physical Exam     Patient is alert, oriented, no adenopathy, well-dressed, normal affect, normal respiratory effort. Examination of the right lower extremity patient does have varicose veins.  She has a wound that is 3 x 5 cm and a wound just inferior that is 1 x 2 cm.  This is over the tibial crest.  After informed consent a 10 blade knife was used to debride the necrotic tissue and the wound bed still had nonhealthy not viable tissue despite surgical debridement.   Assessment/Plan 1. Ulcer of right leg, limited to breakdown of skin (HCC)       Plan: With the deep necrotic ulcer I have recommended to the family and the caregiver to proceed with surgical debridement of the wound and application of Kerecis tissue graft and obtain soft tissue cultures.  Anticipate patient would be admitted after surgery and plan for discharge to home once the wound is stabilized with a Prevena plus portable wound VAC pump.  Family states they understand wish to proceed at this time, family will need to be with patient for surgical consent on Wednesday 04/16/24.  Bailey Deland Collet, PA-C 04/15/2024, 12:51 PM

## 2024-04-15 NOTE — Progress Notes (Signed)
 Case: [id number] Date/Time: 04/16/24 1340   Procedure: INCISION AND DRAINAGE OF DEEP ABSCESS, CALF (Right) - RIGHT LEG DEBRIDEMENT   Anesthesia type: Choice   Diagnosis: Ulcer of right lower extremity, unspecified ulcer stage (HCC) [L97.919]   Pre-op diagnosis: Blunt Trauma Ulcer Right Leg   [facility]: [facility] ROOM 07 / [facility] OR   Surgeons: Harden Jerona GAILS, MD       DISCUSSION: Bailey [name] is an 83 yo female who is a SDW prior to surgery above. PMH of HTN, bicuspid aortic valve with moderate AI, migraine, dementia, GERD, arthritis, anxiety.  Patient has had several significant falls this year. One in 09/2023 that let to T spine and scapula fx which were treated non operatively. She presented to the ED on 03/13/24 after a fall and she sustained skin tears. Conservative management recommended however pt has now developed a necrotic ulcer which will be debrided in OR. Pt will be admitted after surgery.  Patient with hx of bicuspid aortic valve. Moderate AI by echo in 2018. Last seen by Cardiology in 2020. Per Dr. Alveta in OV in 2020: At this point, I doubt she will need to have this valve replaced. Per PCP in 11/15/23 office note murmur is not worse. Recommend eval DOS.  VS: LMP 06/23/2010 (Approximate)   PROVIDERS: Shayne Anes, MD   LABS: Obtain DOS   EKG 10/11/23:  Sinus rhythm, rate 62 Probable left atrial enlargement Left anterior fascicular block Abnormal R-wave progression, early transition Left ventricular hypertrophy Anterior Q waves, possibly due to LVH  CV:  Echo 05/08/2017: Study Conclusions   - Left ventricle: The cavity size was normal. Systolic function was    normal. The estimated ejection fraction was in the range of 55%    to 60%. Wall motion was normal; there were no regional wall    motion abnormalities. Doppler parameters are consistent with    abnormal left ventricular relaxation (grade 1 diastolic    dysfunction). Doppler parameters are consistent with  elevated    ventricular end-diastolic filling pressure.  - Aortic valve: Bicuspid; moderately thickened, moderately    calcified leaflets. There was very mild stenosis. There was mild    to moderate regurgitation. Mean gradient (S): 7 mm Hg. Valve area    (VTI): 1.52 cm^2. Valve area (Vmax): 1.28 cm^2. Valve area    (Vmean): 1.4 cm^2.  - Ascending aorta: The ascending aorta was normal in size.  - Mitral valve: There was mild regurgitation. Valve area by    pressure half-time: 1.72 cm^2.  - Right ventricle: Systolic function was normal.  - Tricuspid valve: There was mild regurgitation.  - Pulmonary arteries: Systolic pressure was within the normal    range.  - Inferior vena cava: The vessel was normal in size. The    respirophasic diameter changes were in the normal range (= 50%),    consistent with normal central venous pressure.  - Pericardium, extracardiac: There was no pericardial effusion.   Impressions:   - There is no significant change since the prior study on    03/01/2012.    Past Medical History:  Diagnosis Date   Anxiety    Aortic insufficiency    Arthritis    hands (01/06/2013)   Basal cell carcinoma of skin    Bicuspid aortic valve    Cataract    right   Colitis, collagenous summer of 2013   Colon polyp    DDD (degenerative disc disease) 12/2009   Dementia (HCC)    GERD (  gastroesophageal reflux disease)    /endoscopy; doesn't bother me at all (01/06/2013)   Hard of hearing    bilat   Heart murmur    Hyperlipidemia    Hypertension    Left facial numbness    previous, not current   Migraine    used to have once/month; none in years (01/06/2013)   Palpitations    Pneumonia ~ 1944; ~ 2009   Tinnitus    Vertigo     Past Surgical History:  Procedure Laterality Date   CATARACT EXTRACTION EXTRACAPSULAR Bilateral 2015   COLONOSCOPY N/A 01/07/2013   Procedure: COLONOSCOPY;  Surgeon: Lamar LULLA Bunk, MD;  Location: [facility] ENDOSCOPY;  Service: Endoscopy;   Laterality: N/A;  The colonoscopy will be unprepped   DILATION AND CURETTAGE OF UTERUS  2002   w/hysteroscopy   ESOPHAGOGASTRODUODENOSCOPY N/A 01/07/2013   Procedure: ESOPHAGOGASTRODUODENOSCOPY (EGD);  Surgeon: Lamar LULLA Bunk, MD;  Location: [facility] ENDOSCOPY;  Service: Endoscopy;  Laterality: N/A;   HYSTEROSCOPY  2002   D&C(sub fibroid)    HYSTEROSCOPY  2006   resection polyps D&C   THYROIDECTOMY, PARTIAL     removed gland from left side of neck that was to go down and past thyroid  in front (01/06/2013)   TONSILLECTOMY     when I was little (01/06/2013)   TUBAL LIGATION Bilateral 1970's   TUMOR REMOVAL  1990's & 06/23/2012, 02/2014   benign; roof of mouth (01/06/2013)    MEDICATIONS: No current facility-administered medications for this encounter.    amLODipine  (NORVASC ) 10 MG tablet   budesonide  (ENTOCORT EC ) 3 MG 24 hr capsule   Cholecalciferol  (VITAMIN D3) 125 MCG (5000 UT) TABS   cyanocobalamin  (VITAMIN B12) 1000 MCG tablet   donepezil  (ARICEPT ) 5 MG tablet   escitalopram  (LEXAPRO ) 10 MG tablet   furosemide  (LASIX ) 20 MG tablet   hydrALAZINE  (APRESOLINE ) 25 MG tablet   losartan  (COZAAR ) 100 MG tablet   metoprolol  succinate (TOPROL -XL) 25 MG 24 hr tablet   oxyCODONE  (OXY IR/ROXICODONE ) 5 MG immediate release tablet   pantoprazole  (PROTONIX ) 40 MG tablet   potassium chloride  (KLOR-CON ) 10 MEQ tablet    Burnard CHRISTELLA Odis DEVONNA [facility] Surgical Short Stay/Anesthesiology [facility] Phone [phone] 04/15/2024 1:55 PM

## 2024-04-15 NOTE — Progress Notes (Signed)
 PCP - Dr Oneil Ezra Bohr, AGNP Cardiologist - none currently (Seen Dr Alveta in past)  Chest x-ray - 10/11/23 EKG - 10/11/23 Stress Test - n/a ECHO - 05/08/17 Cardiac Cath - n/a  ICD Pacemaker/Loop - n/a  Sleep Study -  n/a CPAP - none  Diabetes - none  Aspirin/Blood Thinner Instructions:  n/a  ERAS - clear liquids til 10:55 AM DOS.  Anesthesia review: Yes  STOP now taking any Aspirin (unless otherwise instructed by your surgeon), Aleve, Naproxen, Ibuprofen, Motrin, Advil, Goody's, BC's, all herbal medications, fish oil, and all vitamins.   Coronavirus Screening Does the patient have any of the following symptoms:  Cough yes/no: No Fever (>100.72F)  yes/no: No Runny nose yes/no: No Sore throat yes/no: No Difficulty breathing/shortness of breath  yes/no: No  Have you traveled in the last 14 days and where? yes/no: No  Patient's Daughter Corean Burdock verbalized understanding of instructions that were given via phone.

## 2024-04-15 NOTE — Anesthesia Preprocedure Evaluation (Signed)
 Anesthesia Evaluation  Patient identified by MRN, date of birth, ID band Patient confused    Reviewed: Allergy & Precautions, NPO status , Patient's Chart, lab work & pertinent test results  History of Anesthesia Complications Negative for: history of anesthetic complications  Airway Mallampati: III  TM Distance: >3 FB Neck ROM: Full    Dental  (+) Dental Advisory Given   Pulmonary neg pulmonary ROS   Pulmonary exam normal breath sounds clear to auscultation       Cardiovascular hypertension (losartan ), Pt. on medications (-) angina (-) Past MI, (-) Cardiac Stents and (-) CABG (-) dysrhythmias + Valvular Problems/Murmurs (bicuspid aortic valve) AI  Rhythm:Regular Rate:Normal  HLD  TTE 05/08/2017: Study Conclusions   - Left ventricle: The cavity size was normal. Systolic function was    normal. The estimated ejection fraction was in the range of 55%    to 60%. Wall motion was normal; there were no regional wall    motion abnormalities. Doppler parameters are consistent with    abnormal left ventricular relaxation (grade 1 diastolic    dysfunction). Doppler parameters are consistent with elevated    ventricular end-diastolic filling pressure.  - Aortic valve: Bicuspid; moderately thickened, moderately    calcified leaflets. There was very mild stenosis. There was mild    to moderate regurgitation. Mean gradient (S): 7 mm Hg. Valve area    (VTI): 1.52 cm^2. Valve area (Vmax): 1.28 cm^2. Valve area    (Vmean): 1.4 cm^2.  - Ascending aorta: The ascending aorta was normal in size.  - Mitral valve: There was mild regurgitation. Valve area by    pressure half-time: 1.72 cm^2.  - Right ventricle: Systolic function was normal.  - Tricuspid valve: There was mild regurgitation.  - Pulmonary arteries: Systolic pressure was within the normal    range.  - Inferior vena cava: The vessel was normal in size. The    respirophasic diameter  changes were in the normal range (= 50%),    consistent with normal central venous pressure.  - Pericardium, extracardiac: There was no pericardial effusion.     Neuro/Psych  Headaches, neg Seizures PSYCHIATRIC DISORDERS Anxiety    Dementia Vertigo   Neuromuscular disease (neuropathy)    GI/Hepatic Neg liver ROS,GERD  Medicated and Controlled,,  Endo/Other  negative endocrine ROS    Renal/GU Renal disease     Musculoskeletal  (+) Arthritis ,    Abdominal   Peds  Hematology  (+) Blood dyscrasia, anemia Lab Results      Component                Value               Date                      WBC                      9.4                 10/18/2023                HGB                      11.0 (L)            10/18/2023                HCT  33.7 (L)            10/18/2023                MCV                      92.3                10/18/2023                PLT                      235                 10/18/2023              Anesthesia Other Findings Ulcer of right lower extremity  Reproductive/Obstetrics                             Anesthesia Physical Anesthesia Plan  ASA: 3  Anesthesia Plan: General   Post-op Pain Management: Tylenol  PO (pre-op)*   Induction: Intravenous  PONV Risk Score and Plan: 3 and Ondansetron , Dexamethasone and Treatment may vary due to age or medical condition  Airway Management Planned: LMA  Additional Equipment:   Intra-op Plan:   Post-operative Plan: Extubation in OR  Informed Consent: I have reviewed the patients History and Physical, chart, labs and discussed the procedure including the risks, benefits and alternatives for the proposed anesthesia with the patient or authorized representative who has indicated his/her understanding and acceptance.     Dental advisory given and Consent reviewed with POA  Plan Discussed with: CRNA and Anesthesiologist  Anesthesia Plan Comments: (PAT note from  6/24. Patient has legal guardian. Legal guardian consented.  Risks of general anesthesia discussed including, but not limited to, sore throat, hoarse voice, chipped/damaged teeth, injury to vocal cords, nausea and vomiting, allergic reactions, lung infection, heart attack, stroke, and death. All questions answered. )        Anesthesia Quick Evaluation

## 2024-04-16 ENCOUNTER — Inpatient Hospital Stay (HOSPITAL_COMMUNITY)
Admission: AD | Admit: 2024-04-16 | Discharge: 2024-04-22 | DRG: 264 | Disposition: A | Discharge status: Home Health | Attending: Orthopedic Surgery | Admitting: Orthopedic Surgery

## 2024-04-16 ENCOUNTER — Ambulatory Visit (HOSPITAL_COMMUNITY): Payer: Self-pay | Admitting: Registered Nurse

## 2024-04-16 ENCOUNTER — Encounter (HOSPITAL_COMMUNITY): Payer: Self-pay | Admitting: Orthopedic Surgery

## 2024-04-16 ENCOUNTER — Other Ambulatory Visit: Payer: Self-pay

## 2024-04-16 ENCOUNTER — Encounter (HOSPITAL_COMMUNITY)
Admission: AD | Disposition: A | Discharge status: Home Health | Payer: Self-pay | Source: Home / Self Care | Attending: Orthopedic Surgery

## 2024-04-16 DIAGNOSIS — S81801A Unspecified open wound, right lower leg, initial encounter: Secondary | ICD-10-CM | POA: Diagnosis not present

## 2024-04-16 DIAGNOSIS — Z8349 Family history of other endocrine, nutritional and metabolic diseases: Secondary | ICD-10-CM | POA: Diagnosis not present

## 2024-04-16 DIAGNOSIS — F0390 Unspecified dementia without behavioral disturbance: Secondary | ICD-10-CM | POA: Diagnosis present

## 2024-04-16 DIAGNOSIS — Z888 Allergy status to other drugs, medicaments and biological substances status: Secondary | ICD-10-CM

## 2024-04-16 DIAGNOSIS — L97911 Non-pressure chronic ulcer of unspecified part of right lower leg limited to breakdown of skin: Secondary | ICD-10-CM

## 2024-04-16 DIAGNOSIS — I351 Nonrheumatic aortic (valve) insufficiency: Secondary | ICD-10-CM | POA: Diagnosis not present

## 2024-04-16 DIAGNOSIS — E785 Hyperlipidemia, unspecified: Secondary | ICD-10-CM | POA: Diagnosis not present

## 2024-04-16 DIAGNOSIS — R296 Repeated falls: Secondary | ICD-10-CM | POA: Diagnosis present

## 2024-04-16 DIAGNOSIS — Z833 Family history of diabetes mellitus: Secondary | ICD-10-CM | POA: Diagnosis not present

## 2024-04-16 DIAGNOSIS — Z781 Physical restraint status: Secondary | ICD-10-CM | POA: Diagnosis not present

## 2024-04-16 DIAGNOSIS — S8011XA Contusion of right lower leg, initial encounter: Secondary | ICD-10-CM | POA: Diagnosis present

## 2024-04-16 DIAGNOSIS — Z85828 Personal history of other malignant neoplasm of skin: Secondary | ICD-10-CM

## 2024-04-16 DIAGNOSIS — Q2381 Bicuspid aortic valve: Secondary | ICD-10-CM

## 2024-04-16 DIAGNOSIS — Z860100 Personal history of colon polyps, unspecified: Secondary | ICD-10-CM | POA: Diagnosis not present

## 2024-04-16 DIAGNOSIS — E11621 Type 2 diabetes mellitus with foot ulcer: Secondary | ICD-10-CM

## 2024-04-16 DIAGNOSIS — Z808 Family history of malignant neoplasm of other organs or systems: Secondary | ICD-10-CM

## 2024-04-16 DIAGNOSIS — Z886 Allergy status to analgesic agent status: Secondary | ICD-10-CM

## 2024-04-16 DIAGNOSIS — Z8701 Personal history of pneumonia (recurrent): Secondary | ICD-10-CM | POA: Diagnosis not present

## 2024-04-16 DIAGNOSIS — F419 Anxiety disorder, unspecified: Secondary | ICD-10-CM | POA: Diagnosis not present

## 2024-04-16 DIAGNOSIS — T148XXA Other injury of unspecified body region, initial encounter: Secondary | ICD-10-CM | POA: Diagnosis present

## 2024-04-16 DIAGNOSIS — Z8262 Family history of osteoporosis: Secondary | ICD-10-CM

## 2024-04-16 DIAGNOSIS — I96 Gangrene, not elsewhere classified: Principal | ICD-10-CM | POA: Diagnosis present

## 2024-04-16 DIAGNOSIS — I10 Essential (primary) hypertension: Secondary | ICD-10-CM

## 2024-04-16 DIAGNOSIS — K219 Gastro-esophageal reflux disease without esophagitis: Secondary | ICD-10-CM | POA: Diagnosis not present

## 2024-04-16 DIAGNOSIS — W19XXXA Unspecified fall, initial encounter: Secondary | ICD-10-CM | POA: Diagnosis not present

## 2024-04-16 DIAGNOSIS — Z8249 Family history of ischemic heart disease and other diseases of the circulatory system: Secondary | ICD-10-CM | POA: Diagnosis not present

## 2024-04-16 DIAGNOSIS — L97919 Non-pressure chronic ulcer of unspecified part of right lower leg with unspecified severity: Secondary | ICD-10-CM | POA: Diagnosis not present

## 2024-04-16 HISTORY — PX: INCISION AND DRAINAGE OF DEEP ABSCESS, CALF: SHX7361

## 2024-04-16 LAB — BASIC METABOLIC PANEL WITH GFR
Anion gap: 12 (ref 5–15)
BUN: 15 mg/dL (ref 8–23)
CO2: 26 mmol/L (ref 22–32)
Calcium: 8.4 mg/dL — ABNORMAL LOW (ref 8.9–10.3)
Chloride: 104 mmol/L (ref 98–111)
Creatinine, Ser: 0.85 mg/dL (ref 0.44–1.00)
GFR, Estimated: >60 mL/min (ref >60)
Glucose, Bld: 97 mg/dL (ref 70–99)
Potassium: 3.4 mmol/L — ABNORMAL LOW (ref 3.5–5.1)
Sodium: 142 mmol/L (ref 135–145)

## 2024-04-16 LAB — CBC
HCT: 36.5 % (ref 36.0–46.0)
Hemoglobin: 11.8 g/dL — ABNORMAL LOW (ref 12.0–15.0)
MCH: 30.9 pg (ref 26.0–34.0)
MCHC: 32.3 g/dL (ref 30.0–36.0)
MCV: 95.5 fL (ref 80.0–100.0)
Platelets: 184 10*3/uL (ref 150–400)
RBC: 3.82 MIL/uL — ABNORMAL LOW (ref 3.87–5.11)
RDW: 13.1 % (ref 11.5–15.5)
WBC: 6.2 10*3/uL (ref 4.0–10.5)
nRBC: 0 % (ref 0.0–0.2)

## 2024-04-16 SURGERY — INCISION AND DRAINAGE OF DEEP ABSCESS, CALF
Anesthesia: General | Laterality: Right

## 2024-04-16 MED ORDER — HYDRALAZINE HCL 25 MG PO TABS
25.0000 mg | ORAL_TABLET | Freq: Three times a day (TID) | ORAL | Status: DC
  Administered 2024-04-16 – 2024-04-22 (×16): 25 mg via ORAL
  Filled 2024-04-16 (×17): qty 1

## 2024-04-16 MED ORDER — METOPROLOL SUCCINATE ER 25 MG PO TB24
25.0000 mg | ORAL_TABLET | Freq: Every evening | ORAL | Status: DC
  Administered 2024-04-16 – 2024-04-21 (×6): 25 mg via ORAL
  Filled 2024-04-16 (×6): qty 1

## 2024-04-16 MED ORDER — METOPROLOL TARTRATE 5 MG/5ML IV SOLN: 2.0000 mg | INTRAVENOUS | Status: DC | PRN

## 2024-04-16 MED ORDER — PANTOPRAZOLE SODIUM 40 MG PO TBEC
40.0000 mg | DELAYED_RELEASE_TABLET | Freq: Every day | ORAL | Status: DC
  Administered 2024-04-17 – 2024-04-22 (×6): 40 mg via ORAL
  Filled 2024-04-16 (×6): qty 1

## 2024-04-16 MED ORDER — DONEPEZIL HCL 10 MG PO TABS
5.0000 mg | ORAL_TABLET | Freq: Every day | ORAL | Status: DC
  Administered 2024-04-17 – 2024-04-22 (×6): 5 mg via ORAL
  Filled 2024-04-16 (×6): qty 1

## 2024-04-16 MED ORDER — ALUM & MAG HYDROXIDE-SIMETH 200-200-20 MG/5ML PO SUSP: 15.0000 mL | ORAL | Status: DC | PRN

## 2024-04-16 MED ORDER — LIDOCAINE 2% (20 MG/ML) 5 ML SYRINGE
INTRAMUSCULAR | Status: AC
  Filled 2024-04-16: qty 5

## 2024-04-16 MED ORDER — AMLODIPINE BESYLATE 10 MG PO TABS
10.0000 mg | ORAL_TABLET | Freq: Every day | ORAL | Status: DC
  Administered 2024-04-17 – 2024-04-22 (×6): 10 mg via ORAL
  Filled 2024-04-16 (×6): qty 1

## 2024-04-16 MED ORDER — ONDANSETRON HCL 4 MG/2ML IJ SOLN
INTRAMUSCULAR | Status: DC | PRN
  Administered 2024-04-16: 4 mg via INTRAVENOUS

## 2024-04-16 MED ORDER — OXYCODONE HCL 5 MG PO TABS
ORAL_TABLET | ORAL | Status: AC
  Filled 2024-04-16: qty 1

## 2024-04-16 MED ORDER — CEFAZOLIN SODIUM 1 G IJ SOLR
INTRAMUSCULAR | Status: AC
  Filled 2024-04-16: qty 20

## 2024-04-16 MED ORDER — VITAMIN D 25 MCG (1000 UNIT) PO TABS
5000.0000 [IU] | ORAL_TABLET | Freq: Every day | ORAL | Status: DC
  Administered 2024-04-17: 1000 [IU] via ORAL
  Administered 2024-04-18 – 2024-04-22 (×5): 5000 [IU] via ORAL
  Filled 2024-04-16 (×6): qty 5

## 2024-04-16 MED ORDER — EPHEDRINE 5 MG/ML INJ
INTRAVENOUS | Status: AC
  Filled 2024-04-16: qty 5

## 2024-04-16 MED ORDER — PROPOFOL 10 MG/ML IV BOLUS
INTRAVENOUS | Status: AC
  Filled 2024-04-16: qty 20

## 2024-04-16 MED ORDER — POTASSIUM CHLORIDE CRYS ER 20 MEQ PO TBCR: 20.0000 meq | EXTENDED_RELEASE_TABLET | Freq: Every day | ORAL | Status: DC | PRN

## 2024-04-16 MED ORDER — OXYCODONE HCL 5 MG/5ML PO SOLN: 5.0000 mg | Freq: Once | ORAL | Status: AC | PRN

## 2024-04-16 MED ORDER — LOSARTAN POTASSIUM 50 MG PO TABS
100.0000 mg | ORAL_TABLET | Freq: Every evening | ORAL | Status: DC
  Administered 2024-04-16 – 2024-04-21 (×6): 100 mg via ORAL
  Filled 2024-04-16 (×6): qty 2

## 2024-04-16 MED ORDER — LIDOCAINE 2% (20 MG/ML) 5 ML SYRINGE
INTRAMUSCULAR | Status: DC | PRN
  Administered 2024-04-16: 60 mg via INTRAVENOUS

## 2024-04-16 MED ORDER — CEFAZOLIN SODIUM-DEXTROSE 2-3 GM-%(50ML) IV SOLR
INTRAVENOUS | Status: DC | PRN
  Administered 2024-04-16: 2 g via INTRAVENOUS

## 2024-04-16 MED ORDER — VASHE WOUND IRRIGATION OPTIME
TOPICAL | Status: DC | PRN
  Administered 2024-04-16: 34 [oz_av]

## 2024-04-16 MED ORDER — FENTANYL CITRATE (PF) 100 MCG/2ML IJ SOLN
INTRAMUSCULAR | Status: AC
Start: 2024-04-16 — End: 2024-04-16
  Filled 2024-04-16: qty 2

## 2024-04-16 MED ORDER — AMISULPRIDE (ANTIEMETIC) 5 MG/2ML IV SOLN: 10.0000 mg | Freq: Once | INTRAVENOUS | Status: DC | PRN

## 2024-04-16 MED ORDER — FUROSEMIDE 20 MG PO TABS
20.0000 mg | ORAL_TABLET | Freq: Every morning | ORAL | Status: DC
  Administered 2024-04-17 – 2024-04-22 (×6): 20 mg via ORAL
  Filled 2024-04-16 (×7): qty 1

## 2024-04-16 MED ORDER — DEXAMETHASONE SODIUM PHOSPHATE 10 MG/ML IJ SOLN
INTRAMUSCULAR | Status: DC | PRN
  Administered 2024-04-16: 10 mg via INTRAVENOUS

## 2024-04-16 MED ORDER — PANTOPRAZOLE SODIUM 40 MG PO TBEC: 40.0000 mg | DELAYED_RELEASE_TABLET | Freq: Every day | ORAL | Status: DC

## 2024-04-16 MED ORDER — OXYCODONE HCL 5 MG PO TABS
5.0000 mg | ORAL_TABLET | Freq: Once | ORAL | Status: AC | PRN
  Administered 2024-04-16: 5 mg via ORAL

## 2024-04-16 MED ORDER — ONDANSETRON HCL 4 MG/2ML IJ SOLN: 4.0000 mg | Freq: Four times a day (QID) | INTRAMUSCULAR | Status: DC | PRN

## 2024-04-16 MED ORDER — FENTANYL CITRATE (PF) 250 MCG/5ML IJ SOLN
INTRAMUSCULAR | Status: AC
  Filled 2024-04-16: qty 5

## 2024-04-16 MED ORDER — FENTANYL CITRATE (PF) 250 MCG/5ML IJ SOLN
INTRAMUSCULAR | Status: DC | PRN
  Administered 2024-04-16 (×2): 25 ug via INTRAVENOUS

## 2024-04-16 MED ORDER — MORPHINE SULFATE (PF) 2 MG/ML IV SOLN
0.5000 mg | INTRAVENOUS | Status: DC | PRN
  Administered 2024-04-16: 1 mg via INTRAVENOUS
  Filled 2024-04-16: qty 1

## 2024-04-16 MED ORDER — 0.9 % SODIUM CHLORIDE (POUR BTL) OPTIME
TOPICAL | Status: DC | PRN
  Administered 2024-04-16: 1000 mL

## 2024-04-16 MED ORDER — LABETALOL HCL 5 MG/ML IV SOLN: 10.0000 mg | INTRAVENOUS | Status: DC | PRN

## 2024-04-16 MED ORDER — DEXAMETHASONE SODIUM PHOSPHATE 10 MG/ML IJ SOLN
INTRAMUSCULAR | Status: AC
  Filled 2024-04-16: qty 1

## 2024-04-16 MED ORDER — ACETAMINOPHEN 500 MG PO TABS
1000.0000 mg | ORAL_TABLET | Freq: Once | ORAL | Status: AC
  Administered 2024-04-16: 1000 mg via ORAL
  Filled 2024-04-16: qty 2

## 2024-04-16 MED ORDER — ACETAMINOPHEN 325 MG PO TABS: 325.0000 mg | ORAL_TABLET | Freq: Four times a day (QID) | ORAL | Status: DC | PRN

## 2024-04-16 MED ORDER — ONDANSETRON HCL 4 MG/2ML IJ SOLN
INTRAMUSCULAR | Status: AC
  Filled 2024-04-16: qty 2

## 2024-04-16 MED ORDER — HYDROCODONE-ACETAMINOPHEN 7.5-325 MG PO TABS: 1.0000 | ORAL_TABLET | ORAL | Status: DC | PRN

## 2024-04-16 MED ORDER — BUDESONIDE 3 MG PO CPEP
6.0000 mg | ORAL_CAPSULE | ORAL | Status: DC
  Administered 2024-04-18 – 2024-04-22 (×3): 6 mg via ORAL
  Filled 2024-04-16 (×3): qty 2

## 2024-04-16 MED ORDER — FENTANYL CITRATE (PF) 100 MCG/2ML IJ SOLN
25.0000 ug | INTRAMUSCULAR | Status: DC | PRN
  Administered 2024-04-16 (×2): 50 ug via INTRAVENOUS

## 2024-04-16 MED ORDER — MAGNESIUM SULFATE 2 GM/50ML IV SOLN: 2.0000 g | Freq: Every day | INTRAVENOUS | Status: DC | PRN

## 2024-04-16 MED ORDER — PHENOL 1.4 % MT LIQD: 1.0000 | OROMUCOSAL | Status: DC | PRN

## 2024-04-16 MED ORDER — BUDESONIDE 3 MG PO CPEP
3.0000 mg | ORAL_CAPSULE | ORAL | Status: DC
  Administered 2024-04-17 – 2024-04-21 (×3): 3 mg via ORAL
  Filled 2024-04-16 (×3): qty 1

## 2024-04-16 MED ORDER — CHLORHEXIDINE GLUCONATE 0.12 % MT SOLN
15.0000 mL | Freq: Once | OROMUCOSAL | Status: AC
  Administered 2024-04-16: 15 mL via OROMUCOSAL
  Filled 2024-04-16: qty 15

## 2024-04-16 MED ORDER — DOCUSATE SODIUM 100 MG PO CAPS
100.0000 mg | ORAL_CAPSULE | Freq: Every day | ORAL | Status: DC
  Administered 2024-04-17 – 2024-04-22 (×6): 100 mg via ORAL
  Filled 2024-04-16 (×6): qty 1

## 2024-04-16 MED ORDER — GUAIFENESIN-DM 100-10 MG/5ML PO SYRP: 15.0000 mL | ORAL_SOLUTION | ORAL | Status: DC | PRN

## 2024-04-16 MED ORDER — ESCITALOPRAM OXALATE 10 MG PO TABS
10.0000 mg | ORAL_TABLET | Freq: Every day | ORAL | Status: DC
  Administered 2024-04-17 – 2024-04-22 (×6): 10 mg via ORAL
  Filled 2024-04-16 (×6): qty 1

## 2024-04-16 MED ORDER — ACETAMINOPHEN 500 MG PO TABS
500.0000 mg | ORAL_TABLET | Freq: Four times a day (QID) | ORAL | Status: AC
  Administered 2024-04-17 (×2): 500 mg via ORAL
  Filled 2024-04-16 (×2): qty 1

## 2024-04-16 MED ORDER — ORAL CARE MOUTH RINSE: 15.0000 mL | Freq: Once | OROMUCOSAL | Status: AC

## 2024-04-16 MED ORDER — HYDRALAZINE HCL 20 MG/ML IJ SOLN: 5.0000 mg | INTRAMUSCULAR | Status: DC | PRN

## 2024-04-16 MED ORDER — PROPOFOL 10 MG/ML IV BOLUS
INTRAVENOUS | Status: DC | PRN
  Administered 2024-04-16: 20 mg via INTRAVENOUS
  Administered 2024-04-16: 100 mg via INTRAVENOUS

## 2024-04-16 MED ORDER — POTASSIUM CHLORIDE ER 10 MEQ PO TBCR: 10.0000 meq | EXTENDED_RELEASE_TABLET | ORAL | Status: DC

## 2024-04-16 MED ORDER — HYDROCODONE-ACETAMINOPHEN 5-325 MG PO TABS
1.0000 | ORAL_TABLET | ORAL | Status: DC | PRN
  Administered 2024-04-16: 2 via ORAL
  Administered 2024-04-18 – 2024-04-20 (×2): 1 via ORAL
  Filled 2024-04-16: qty 2
  Filled 2024-04-16 (×2): qty 1

## 2024-04-16 MED ORDER — BUDESONIDE 3 MG PO CPEP: 3.0000 mg | ORAL_CAPSULE | ORAL | Status: DC

## 2024-04-16 MED ORDER — EPHEDRINE SULFATE-NACL 50-0.9 MG/10ML-% IV SOSY
PREFILLED_SYRINGE | INTRAVENOUS | Status: DC | PRN
  Administered 2024-04-16: 15 mg via INTRAVENOUS

## 2024-04-16 MED ORDER — LACTATED RINGERS IV SOLN: INTRAVENOUS | Status: DC

## 2024-04-16 MED ORDER — VITAMIN B-12 1000 MCG PO TABS
1000.0000 ug | ORAL_TABLET | Freq: Every day | ORAL | Status: DC
  Administered 2024-04-16 – 2024-04-22 (×7): 1000 ug via ORAL
  Filled 2024-04-16 (×7): qty 1

## 2024-04-16 MED ORDER — PROPOFOL 10 MG/ML IV BOLUS
INTRAVENOUS | Status: AC
Start: 2024-04-16 — End: 2024-04-16
  Filled 2024-04-16: qty 20

## 2024-04-16 MED ORDER — SODIUM CHLORIDE 0.9 % IV SOLN: INTRAVENOUS | Status: AC

## 2024-04-16 SURGICAL SUPPLY — 39 items
BAG COUNTER SPONGE SURGICOUNT (BAG) IMPLANT
BLADE SURG 21 STRL SS (BLADE) ×2 IMPLANT
BNDG COHESIVE 4X5 TAN STRL LF (GAUZE/BANDAGES/DRESSINGS) IMPLANT
BNDG COHESIVE 6X5 TAN NS LF (GAUZE/BANDAGES/DRESSINGS) IMPLANT
BNDG COHESIVE 6X5 TAN ST LF (GAUZE/BANDAGES/DRESSINGS) IMPLANT
BNDG GAUZE DERMACEA FLUFF 4 (GAUZE/BANDAGES/DRESSINGS) IMPLANT
CANISTER WOUNDNEG PRESSURE 500 (CANNISTER) IMPLANT
CLEANSER WND VASHE 34 (WOUND CARE) IMPLANT
CLEANSER WND VASHE INSTL 34OZ (WOUND CARE) IMPLANT
COVER SURGICAL LIGHT HANDLE (MISCELLANEOUS) ×4 IMPLANT
DRAPE DERMATAC (DRAPES) IMPLANT
DRAPE U-SHAPE 47X51 STRL (DRAPES) ×2 IMPLANT
DRESSING PREVENA PLUS CUSTOM (GAUZE/BANDAGES/DRESSINGS) IMPLANT
DRESSING VERAFLO CLEANS CC MED (GAUZE/BANDAGES/DRESSINGS) IMPLANT
DRSG ADAPTIC 3X8 NADH LF (GAUZE/BANDAGES/DRESSINGS) ×2 IMPLANT
DRSG VAC PEEL AND PLACE LRG (GAUZE/BANDAGES/DRESSINGS) IMPLANT
DURAPREP 26ML APPLICATOR (WOUND CARE) ×2 IMPLANT
ELECTRODE REM PT RTRN 9FT ADLT (ELECTROSURGICAL) IMPLANT
GAUZE PAD ABD 8X10 STRL (GAUZE/BANDAGES/DRESSINGS) IMPLANT
GAUZE SPONGE 4X4 12PLY STRL (GAUZE/BANDAGES/DRESSINGS) IMPLANT
GLOVE BIOGEL PI IND STRL 9 (GLOVE) ×2 IMPLANT
GLOVE SURG ORTHO 9.0 STRL STRW (GLOVE) ×2 IMPLANT
GOWN STRL REUS W/ TWL XL LVL3 (GOWN DISPOSABLE) ×4 IMPLANT
GRAFT SKIN WND MICRO 38 (Tissue) IMPLANT
KIT BASIN OR (CUSTOM PROCEDURE TRAY) ×2 IMPLANT
KIT TURNOVER KIT B (KITS) ×2 IMPLANT
MANIFOLD NEPTUNE II (INSTRUMENTS) ×2 IMPLANT
NS IRRIG 1000ML POUR BTL (IV SOLUTION) ×2 IMPLANT
PACK ORTHO EXTREMITY (CUSTOM PROCEDURE TRAY) ×2 IMPLANT
PAD ARMBOARD POSITIONER FOAM (MISCELLANEOUS) ×4 IMPLANT
PAD NEG PRESSURE SENSATRAC (MISCELLANEOUS) IMPLANT
SET HNDPC FAN SPRY TIP SCT (DISPOSABLE) IMPLANT
STOCKINETTE IMPERVIOUS 9X36 MD (GAUZE/BANDAGES/DRESSINGS) IMPLANT
SUT ETHILON 2 0 PSLX (SUTURE) ×2 IMPLANT
SWAB COLLECTION DEVICE MRSA (MISCELLANEOUS) ×2 IMPLANT
SWAB CULTURE ESWAB REG 1ML (MISCELLANEOUS) IMPLANT
TOWEL GREEN STERILE (TOWEL DISPOSABLE) ×2 IMPLANT
TUBE CONNECTING 12X1/4 (SUCTIONS) ×2 IMPLANT
YANKAUER SUCT BULB TIP NO VENT (SUCTIONS) ×2 IMPLANT

## 2024-04-16 NOTE — Op Note (Signed)
 04/16/2024  3:27 PM  PATIENT:  Bailey [name]    PRE-OPERATIVE DIAGNOSIS:  Blunt Trauma Ulcer Right Leg  POST-OPERATIVE DIAGNOSIS:  Same  PROCEDURE: Excisional debridement hematoma right leg.  Local tissue transfer for wound closure 12 x 5 cm. Application Kerecis micro graft 38 cm. Application of peel in place large wound VAC dressing.  SURGEON:  Jerona LULLA Sage, MD  PHYSICIAN ASSISTANT: April Green ANESTHESIA:   General  PREOPERATIVE INDICATIONS:  [name] is a  83 y.o. female with a diagnosis of Blunt Trauma Ulcer Right Leg who failed conservative measures and elected for surgical management.    The risks benefits and alternatives were discussed with the patient preoperatively including but not limited to the risks of infection, bleeding, nerve injury, cardiopulmonary complications, the need for revision surgery, among others, and the patient was willing to proceed.  OPERATIVE IMPLANTS:   Implant Name Type Inv. Item Serial No. Manufacturer Lot No. LRB No. Used Action  GRAFT SKIN WND MICRO 38 - [id number] Tissue GRAFT SKIN WND MICRO 38  [employer] [id number] Right 1 Implanted    @ENCIMAGES @  OPERATIVE FINDINGS: Tissue margins were clear.  Patient had good color and contractility of the muscle.  OPERATIVE PROCEDURE: Patient was brought to the operating room and underwent a general anesthetic.  After adequate anesthesia obtained patient's right lower extremity was prepped using DuraPrep draped into a sterile field a timeout was called.  An elliptical incision was made around the ulcerative necrotic tissue right calf.  This left a wound that was 12 x 5 cm.  Tissue margins were undermined to allow for local tissue transfer.  Nonviable fascia muscle and soft tissue was excised with a 21 blade knife and a rondure.  Electrocautery was used for hemostasis.  Once wound margins were healthy and viable the wound was irrigated with Vashe.  Kerecis micro graft 38 cm was used to  reinforce the soft tissue.  After undermining the tissue margins local tissue transfer was used to close the wound 12 x 5 cm.  The Prevena peel and placed large dressing was applied this had a good suction fit this was overwrapped with Coban.  Patient was extubated taken the PACU in stable condition.   DISCHARGE PLANNING:  Antibiotic duration: Continue antibiotics for 24 hours  Weightbearing: Weightbearing as tolerated  Pain medication: Opioid pathway  Dressing care/ Wound VAC: Wound VAC  Ambulatory devices: Walker  Discharge to: Anticipate discharge to home when safe with therapy.  Follow-up: In the office 1 week post operative.

## 2024-04-16 NOTE — Anesthesia Procedure Notes (Signed)
 Procedure Name: LMA Insertion Date/Time: 04/16/2024 2:40 PM  Performed by: Julien Manus, CRNAPre-anesthesia Checklist: Patient identified, Emergency Drugs available, Suction available and Patient being monitored Patient Re-evaluated:Patient Re-evaluated prior to induction Oxygen Delivery Method: Circle System Utilized Preoxygenation: Pre-oxygenation with 100% oxygen Induction Type: IV induction Ventilation: Mask ventilation without difficulty LMA: LMA inserted LMA Size: 4.0 Number of attempts: 1 Airway Equipment and Method: Bite block Placement Confirmation: positive ETCO2 Tube secured with: Tape Dental Injury: Teeth and Oropharynx as per pre-operative assessment

## 2024-04-16 NOTE — Interval H&P Note (Signed)
 History and Physical Interval Note:  04/16/2024 2:26 PM  Bailey [name]  has presented today for surgery, with the diagnosis of Blunt Trauma Ulcer Right Leg.  The various methods of treatment have been discussed with the patient and family. After consideration of risks, benefits and other options for treatment, the patient has consented to  Procedure(s) with comments: INCISION AND DRAINAGE OF DEEP ABSCESS,  RIGHT LOWER EXTREMITY (Right) - RIGHT LEG DEBRIDEMENT as a surgical intervention.  The patient's history has been reviewed, patient examined, no change in status, stable for surgery.  I have reviewed the patient's chart and labs.  Questions were answered to the patient's satisfaction.     Armya Westerhoff V Zaelyn Barbary

## 2024-04-16 NOTE — Transfer of Care (Signed)
 Immediate Anesthesia Transfer of Care Note  Patient: Bailey [name]  Procedure(s) Performed: INCISION AND DRAINAGE OF DEEP ABSCESS,  RIGHT LOWER EXTREMITY (Right)  Patient Location: PACU  Anesthesia Type:General  Level of Consciousness: Awake  Airway & Oxygen Therapy: Patient Spontanous Breathing and Patient connected to face mask oxygen  Post-op Assessment: Report given to RN and Post -op Vital signs reviewed and stable  Post vital signs: Reviewed and stable  Last Vitals:  Vitals Value Taken Time  BP 181/70 04/16/24 15:17  Temp    Pulse 54 04/16/24 15:20  Resp 20 04/16/24 15:20  SpO2 97 % 04/16/24 15:20  Vitals shown include unfiled device data.  Last Pain:  Vitals:   04/16/24 1142  TempSrc: Oral         Complications: No notable events documented.

## 2024-04-16 NOTE — Progress Notes (Addendum)
 Pt has dementia. A&OX1. Pt has POA- daughter Corean Burdock. Pt's caregiver Luke states that pt had a half of the chocolate chip cookie and coffee with some cream around 0630 am. Per dr. Peggye surgery will be postponed till 1430. OR and dr. Harden are notified.

## 2024-04-17 ENCOUNTER — Encounter (HOSPITAL_COMMUNITY): Payer: Self-pay | Admitting: Orthopedic Surgery

## 2024-04-17 DIAGNOSIS — T148XXA Other injury of unspecified body region, initial encounter: Secondary | ICD-10-CM | POA: Diagnosis present

## 2024-04-17 DIAGNOSIS — L97911 Non-pressure chronic ulcer of unspecified part of right lower leg limited to breakdown of skin: Secondary | ICD-10-CM | POA: Diagnosis present

## 2024-04-17 DIAGNOSIS — Z8349 Family history of other endocrine, nutritional and metabolic diseases: Secondary | ICD-10-CM | POA: Diagnosis not present

## 2024-04-17 DIAGNOSIS — W19XXXA Unspecified fall, initial encounter: Secondary | ICD-10-CM | POA: Diagnosis present

## 2024-04-17 DIAGNOSIS — Z8262 Family history of osteoporosis: Secondary | ICD-10-CM | POA: Diagnosis not present

## 2024-04-17 DIAGNOSIS — Z8701 Personal history of pneumonia (recurrent): Secondary | ICD-10-CM | POA: Diagnosis not present

## 2024-04-17 DIAGNOSIS — F0390 Unspecified dementia without behavioral disturbance: Secondary | ICD-10-CM | POA: Diagnosis present

## 2024-04-17 DIAGNOSIS — I96 Gangrene, not elsewhere classified: Secondary | ICD-10-CM | POA: Diagnosis present

## 2024-04-17 DIAGNOSIS — Z886 Allergy status to analgesic agent status: Secondary | ICD-10-CM | POA: Diagnosis not present

## 2024-04-17 DIAGNOSIS — Q2381 Bicuspid aortic valve: Secondary | ICD-10-CM | POA: Diagnosis not present

## 2024-04-17 DIAGNOSIS — Z781 Physical restraint status: Secondary | ICD-10-CM | POA: Diagnosis not present

## 2024-04-17 DIAGNOSIS — S8011XA Contusion of right lower leg, initial encounter: Secondary | ICD-10-CM | POA: Diagnosis present

## 2024-04-17 DIAGNOSIS — Z8249 Family history of ischemic heart disease and other diseases of the circulatory system: Secondary | ICD-10-CM | POA: Diagnosis not present

## 2024-04-17 DIAGNOSIS — Z85828 Personal history of other malignant neoplasm of skin: Secondary | ICD-10-CM | POA: Diagnosis not present

## 2024-04-17 DIAGNOSIS — Z833 Family history of diabetes mellitus: Secondary | ICD-10-CM | POA: Diagnosis not present

## 2024-04-17 DIAGNOSIS — L97919 Non-pressure chronic ulcer of unspecified part of right lower leg with unspecified severity: Secondary | ICD-10-CM | POA: Diagnosis present

## 2024-04-17 DIAGNOSIS — K219 Gastro-esophageal reflux disease without esophagitis: Secondary | ICD-10-CM | POA: Diagnosis present

## 2024-04-17 DIAGNOSIS — Z808 Family history of malignant neoplasm of other organs or systems: Secondary | ICD-10-CM | POA: Diagnosis not present

## 2024-04-17 DIAGNOSIS — I351 Nonrheumatic aortic (valve) insufficiency: Secondary | ICD-10-CM | POA: Diagnosis present

## 2024-04-17 DIAGNOSIS — E785 Hyperlipidemia, unspecified: Secondary | ICD-10-CM | POA: Diagnosis present

## 2024-04-17 DIAGNOSIS — I10 Essential (primary) hypertension: Secondary | ICD-10-CM | POA: Diagnosis present

## 2024-04-17 DIAGNOSIS — Z888 Allergy status to other drugs, medicaments and biological substances status: Secondary | ICD-10-CM | POA: Diagnosis not present

## 2024-04-17 DIAGNOSIS — R296 Repeated falls: Secondary | ICD-10-CM | POA: Diagnosis present

## 2024-04-17 DIAGNOSIS — Z860100 Personal history of colon polyps, unspecified: Secondary | ICD-10-CM | POA: Diagnosis not present

## 2024-04-17 LAB — CBC
HCT: 34.6 % — ABNORMAL LOW (ref 36.0–46.0)
Hemoglobin: 11.2 g/dL — ABNORMAL LOW (ref 12.0–15.0)
MCH: 30.4 pg (ref 26.0–34.0)
MCHC: 32.4 g/dL (ref 30.0–36.0)
MCV: 94 fL (ref 80.0–100.0)
Platelets: 170 10*3/uL (ref 150–400)
RBC: 3.68 MIL/uL — ABNORMAL LOW (ref 3.87–5.11)
RDW: 12.8 % (ref 11.5–15.5)
WBC: 9 10*3/uL (ref 4.0–10.5)
nRBC: 0 % (ref 0.0–0.2)

## 2024-04-17 LAB — BASIC METABOLIC PANEL WITH GFR
Anion gap: 13 (ref 5–15)
BUN: 19 mg/dL (ref 8–23)
CO2: 20 mmol/L — ABNORMAL LOW (ref 22–32)
Calcium: 7.5 mg/dL — ABNORMAL LOW (ref 8.9–10.3)
Chloride: 102 mmol/L (ref 98–111)
Creatinine, Ser: 0.72 mg/dL (ref 0.44–1.00)
GFR, Estimated: >60 mL/min (ref >60)
Glucose, Bld: 121 mg/dL — ABNORMAL HIGH (ref 70–99)
Potassium: 3.9 mmol/L (ref 3.5–5.1)
Sodium: 135 mmol/L (ref 135–145)

## 2024-04-17 MED ORDER — LORAZEPAM 2 MG/ML IJ SOLN
1.0000 mg | Freq: Four times a day (QID) | INTRAMUSCULAR | Status: DC | PRN
  Administered 2024-04-17 – 2024-04-20 (×4): 1 mg via INTRAVENOUS
  Filled 2024-04-17 (×4): qty 1

## 2024-04-17 MED ORDER — OXYCODONE HCL 5 MG PO TABS
5.0000 mg | ORAL_TABLET | Freq: Four times a day (QID) | ORAL | Status: DC | PRN
  Administered 2024-04-17: 10 mg via ORAL
  Administered 2024-04-19 (×2): 5 mg via ORAL
  Filled 2024-04-17: qty 2
  Filled 2024-04-17 (×2): qty 1

## 2024-04-17 MED ORDER — HALOPERIDOL 5 MG PO TABS
5.0000 mg | ORAL_TABLET | Freq: Once | ORAL | Status: AC
  Administered 2024-04-17: 5 mg via ORAL
  Filled 2024-04-17: qty 1

## 2024-04-17 NOTE — Evaluation (Signed)
 Occupational Therapy Evaluation Patient Details Name: Bailey [name] MRN: [id number] DOB: February 12, 1941 Today's Date: 04/17/2024   History of Present Illness   Bailey [name] is a 83 yo female who presented 6/25 for planned sx after a fall related injury that occurred 03/13/24. 6/25 debridement large hematoma right leg with local tissue transfer and reinforcement with biologic tissue graft. PMH of HTN, bicuspid aortic valve with moderate AI, migraine, dementia, GERD, arthritis, anxiety.     Clinical Impressions Chriss was evaluated s/p the above admission list. Per PCA pt needs cognitive assist for all ADLs and mobility but does not use DME at baseline. Upon evaluation the pt was limited by lethargy, baseline dementia, global weakness, mild discomfort with WBing on RLE, unsteady balance, and limited tolerance for activity. Overall she needed minimal physical assist for bed mobility, transfers and short mobility with RW however she needed maximal cues for initiation, sequencing, AD use and safety. Due to the deficits listed below the pt also needs maximal multimodal cues for ADLs and min-max physical assist. Pt will benefit from continued acute OT services and skilled inpatient follow up therapy, <3 hours/day (discussed with PCA, Luke Gilding over the phone).      If plan is discharge home, recommend the following:   A lot of help with walking and/or transfers;Two people to help with bathing/dressing/bathroom;Assistance with cooking/housework;Assistance with feeding;Direct supervision/assist for medications management;Direct supervision/assist for financial management;Assist for transportation;Help with stairs or ramp for entrance;Supervision due to cognitive status     Functional Status Assessment   Patient has had a recent decline in their functional status and demonstrates the ability to make significant improvements in function in a reasonable and predictable amount of time.      Equipment Recommendations   None recommended by OT      Precautions/Restrictions   Precautions Precautions: Fall Recall of Precautions/Restrictions: Impaired Precaution/Restrictions Comments: wound vac, posery belt, bilat mits Restrictions Weight Bearing Restrictions Per Provider Order: Yes RLE Weight Bearing Per Provider Order: Weight bearing as tolerated     Mobility Bed Mobility Overal bed mobility: Needs Assistance Bed Mobility: Supine to Sit, Sit to Supine     Supine to sit: Min assist Sit to supine: Min assist   General bed mobility comments: min A to initiate    Transfers Overall transfer level: Needs assistance Equipment used: Rolling walker (2 wheels) Transfers: Sit to/from Stand Sit to Stand: Min assist           General transfer comment: good power up, needed cues to use walker (assume she does not use at baseline), pt able to ambulate ~49ft prior to asking to return supine.      Balance Overall balance assessment: Needs assistance Sitting-balance support: Feet supported Sitting balance-Leahy Scale: Fair Sitting balance - Comments: CGA for safety, pt lethargic   Standing balance support: Single extremity supported, During functional activity Standing balance-Leahy Scale: Poor Standing balance comment: a few LOB statically without UE support, min A to correct.                           ADL either performed or assessed with clinical judgement   ADL Overall ADL's : Needs assistance/impaired Eating/Feeding: Maximal assistance   Grooming: Modified independent   Upper Body Bathing: Maximal assistance   Lower Body Bathing: Maximal assistance   Upper Body Dressing : Maximal assistance   Lower Body Dressing: Total assistance   Toilet Transfer: Maximal assistance;Rolling walker (2 wheels) Toilet Transfer  Details (indicate cue type and reason): minA physically but required max cues Toileting- Clothing Manipulation and Hygiene:  Sitting/lateral lean;Moderate assistance       Functional mobility during ADLs: Maximal assistance;Rolling walker (2 wheels) General ADL Comments: pt has adequate strength to move however she requires maximal multimodal cues to participate functionally     Vision Baseline Vision/History: 0 No visual deficits Vision Assessment?: No apparent visual deficits Additional Comments: dificult to assess, pt tracked therapist appropriately     Perception Perception: Not tested       Praxis Praxis: Not tested       Pertinent Vitals/Pain Pain Assessment Pain Assessment: Faces Faces Pain Scale: Hurts a little bit Pain Location: a little sore when WBing Pain Descriptors / Indicators: Discomfort, Sore Pain Intervention(s): Limited activity within patient's tolerance, Monitored during session     Extremity/Trunk Assessment Upper Extremity Assessment Upper Extremity Assessment: Generalized weakness   Lower Extremity Assessment Lower Extremity Assessment: Defer to PT evaluation (RLE I&D, wound vac)   Cervical / Trunk Assessment Cervical / Trunk Assessment: Kyphotic (mild)   Communication Communication Communication: No apparent difficulties   Cognition Arousal: Lethargic Behavior During Therapy: Flat affect Cognition: History of cognitive impairments             OT - Cognition Comments: hx of dementia, pt able to state her name otherwise discoriented. She followed a few one step directions but ultimately needed maximal multimodal cues to participate. Of note ot recieved ativan around 0200 prior to eval                 Following commands: Impaired Following commands impaired: Follows one step commands inconsistently     Cueing  General Comments   Cueing Techniques: Verbal cues;Gestural cues;Tactile cues  VSS, wound vac functioning           Home Living Family/patient expects to be discharged to:: Private residence Living Arrangements: Spouse/significant  other Available Help at Discharge: Family;Available 24 hours/day;Personal care attendant (husband on O2 per chart review. PCA 6 hrs/day 5 days/wk) Type of Home: House Home Access: Stairs to enter [employer] of Steps: 2 Entrance Stairs-Rails: Can reach both Home Layout: Two level;Able to live on main level with bedroom/bathroom     Bathroom Shower/Tub: Chief Strategy Officer: Standard     Home Equipment: Shower seat;Grab bars - tub/shower   Additional Comments: info confirmed with PCA via phone call      Prior Functioning/Environment Prior Level of Function : Patient poor historian/Family not available             Mobility Comments: per PCA, pt does not use AD. She has had falls when unsupervised. Husband present but not able to provide any level of assist ADLs Comments: PCA provides cognitive assist, husband unable to provide any help. PCA does all IADLs    OT Problem List: Decreased strength;Decreased range of motion;Impaired balance (sitting and/or standing);Decreased activity tolerance;Decreased cognition;Decreased knowledge of use of DME or AE;Decreased safety awareness;Decreased knowledge of precautions;Pain   OT Treatment/Interventions: Self-care/ADL training;Therapeutic exercise;DME and/or AE instruction;Therapeutic activities;Patient/family education;Balance training      OT Goals(Current goals can be found in the care plan section)   Acute Rehab OT Goals Patient Stated Goal: to rehab OT Goal Formulation: Patient unable to participate in goal setting Time For Goal Achievement: 05/01/24 Potential to Achieve Goals: Good ADL Goals Pt Will Perform Grooming: with supervision;standing Pt Will Perform Upper Body Dressing: with supervision Pt Will Perform Lower Body Dressing: with  min assist;sit to/from stand Pt Will Transfer to Toilet: with supervision;ambulating   OT Frequency:  Min 1X/week       AM-PAC OT 6 Clicks Daily Activity      Outcome Measure Help from another person eating meals?: A Lot Help from another person taking care of personal grooming?: A Lot Help from another person toileting, which includes using toliet, bedpan, or urinal?: A Lot Help from another person bathing (including washing, rinsing, drying)?: A Lot Help from another person to put on and taking off regular upper body clothing?: A Lot Help from another person to put on and taking off regular lower body clothing?: A Lot 6 Click Score: 12   End of Session Equipment Utilized During Treatment: Rolling walker (2 wheels) Nurse Communication: Mobility status  Activity Tolerance: Patient tolerated treatment well Patient left: in bed;with call bell/phone within reach;with restraints reapplied;with bed alarm set  OT Visit Diagnosis: Unsteadiness on feet (R26.81);Other abnormalities of gait and mobility (R26.89);Repeated falls (R29.6);Muscle weakness (generalized) (M62.81);History of falling (Z91.81);Pain                Time: 9179-9158 OT Time Calculation (min): 21 min Charges:  OT General Charges $OT Visit: 1 Visit OT Evaluation $OT Eval Moderate Complexity: 1 Mod  Lucie Kendall, OTR/L Acute Rehabilitation Services Office [phone] Secure Chat Communication Preferred   Lucie JONETTA Kendall 04/17/2024, 9:15 AM

## 2024-04-17 NOTE — Progress Notes (Signed)
 Patient ID: Bailey [name], female   DOB: 1941/10/14, 83 y.o.   MRN: [id number] Patient is postoperative day 1 debridement large hematoma right leg with local tissue transfer and reinforcement with biologic tissue graft.  Patient is currently restrained for her safety, and resting comfortably.  There is 150 cc in the wound VAC canister with a good suction fit.  Patient will need to continue inpatient admission until the drainage stabilizes.  Patient will need to be admitted to inpatient for continued medical treatment.

## 2024-04-17 NOTE — Progress Notes (Signed)
 Inpatient Rehab Admissions Coordinator:   Consult received and chart reviewed.  Therapy recommending SNF and would not be able to get insurance approval for diagnosis with [employer] Medicare.  Will sign off for CIR.   Reche Lowers, PT, DPT Admissions Coordinator [phone] 04/17/24  11:32 AM

## 2024-04-17 NOTE — Progress Notes (Signed)
 Pt has pulled wound vac dressing off, this nurse was able to re tape the area. Pt has a good seal on the wound vac, mitten place bilateral. Endorse to the ongoing shift.

## 2024-04-17 NOTE — Discharge Instructions (Signed)
 tom

## 2024-04-17 NOTE — Anesthesia Postprocedure Evaluation (Signed)
 Anesthesia Post Note  Patient: Bailey [name]  Procedure(s) Performed: INCISION AND DRAINAGE OF DEEP ABSCESS,  RIGHT LOWER EXTREMITY (Right)     Patient location during evaluation: PACU Anesthesia Type: General Level of consciousness: awake Pain management: pain level controlled Vital Signs Assessment: post-procedure vital signs reviewed and stable Respiratory status: spontaneous breathing, nonlabored ventilation and respiratory function stable Cardiovascular status: blood pressure returned to baseline and stable Postop Assessment: no apparent nausea or vomiting Anesthetic complications: no   No notable events documented.  Last Vitals:  Vitals:   04/16/24 2016 04/17/24 0235  BP: (!) 142/68 (!) 108/56  Pulse: (!) 58 (!) 51  Resp: 17 18  Temp: 36.7 C 36.4 C  SpO2: 96% 94%    Last Pain:  Vitals:   04/17/24 0235  TempSrc: Oral  PainSc:                  Bernardino SQUIBB Hattye Siegfried

## 2024-04-17 NOTE — Evaluation (Signed)
 Physical Therapy Evaluation Patient Details Name: Bailey [name] MRN: [id number] DOB: [birth year] Today's Date: 04/17/2024  History of Present Illness  Bailey [name] is a 83 yo female who presented 6/25 for planned sx after a fall related injury that occurred 03/13/24. 6/25 debridement large hematoma right leg with local tissue transfer and reinforcement with biologic tissue graft. PMH of HTN, bicuspid aortic valve with moderate AI, migraine, dementia, GERD, arthritis, anxiety.   Clinical Impression  Bailey [name] is 83 y.o. female admitted with above HPI and diagnosis. Patient is currently limited by functional impairments below (see PT problem list). Patient lives with spouse and has PCA for assist with ADL's/IADL's and cognitive challenges. Pt does not use AD at baseline and currently requires Mod assist for safety with transfers and gait for sit<>stand. Pt was able to ambulate ~60' with RW and mod assist to stabilize walker,steady balance and direct gait path/navigate environment. Patient will benefit from continued skilled PT interventions to address impairments and progress independence with mobility. Patient will benefit from continued inpatient follow up therapy, <3 hours/day. Acute PT will follow and progress as able.         If plan is discharge home, recommend the following: A little help with bathing/dressing/bathroom;A little help with walking and/or transfers;Assistance with cooking/housework;Direct supervision/assist for medications management;Assist for transportation;Help with stairs or ramp for entrance;Supervision due to cognitive status   Can travel by private vehicle   Yes    Equipment Recommendations  (TBD at next venue)  Recommendations for Other Services       Functional Status Assessment Patient has had a recent decline in their functional status and demonstrates the ability to make significant improvements in function in a reasonable and predictable amount of  time.     Precautions / Restrictions Precautions Precautions: Fall Recall of Precautions/Restrictions: Impaired Precaution/Restrictions Comments: wound vac, posery belt, bilat mits Restrictions Weight Bearing Restrictions Per Provider Order: Yes RLE Weight Bearing Per Provider Order: Weight bearing as tolerated      Mobility  Bed Mobility Overal bed mobility: Needs Assistance Bed Mobility: Supine to Sit, Sit to Supine     Supine to sit: Min assist Sit to supine: Min assist   General bed mobility comments: min A to initiate    Transfers Overall transfer level: Needs assistance Equipment used: Rolling walker (2 wheels) Transfers: Sit to/from Stand Sit to Stand: Min assist           General transfer comment: cues for hand placement. min assist to rise and stabilize balance with posterior lean. pt slightly impulsive standing with no device at start and cues for safety to sit and wait for therapist and RW.    Ambulation/Gait Ambulation/Gait assistance: Min assist, Mod assist Gait Distance (Feet): 60 Feet Assistive device: Rolling walker (2 wheels) Gait Pattern/deviations: Step-through pattern, Decreased stride length Gait velocity: decr     General Gait Details: pt required cues for position to RW and mod fading to min assist for walker management and stability with balance. pt tending to drift toward Rt and narrowning BOS with turns.  Stairs            Wheelchair Mobility     Tilt Bed    Modified Rankin (Stroke Patients Only)       Balance Overall balance assessment: Needs assistance Sitting-balance support: Feet supported Sitting balance-Leahy Scale: Fair Sitting balance - Comments: CGA for safety, pt lethargic   Standing balance support: Single extremity supported, During functional activity Standing balance-Leahy Scale:  Poor Standing balance comment: a few LOB statically without UE support, min A to correct.                              Pertinent Vitals/Pain Pain Assessment Pain Assessment: Faces Faces Pain Scale: Hurts a little bit Pain Location: Rt leg Pain Descriptors / Indicators: Discomfort, Sore Pain Intervention(s): Limited activity within patient's tolerance, Monitored during session, Repositioned    Home Living Family/patient expects to be discharged to:: Private residence Living Arrangements: Spouse/significant other Available Help at Discharge: Family;Available 24 hours/day;Personal care attendant (husband on O2 per chart review. PCA 6 hrs/day 5 days/wk) Type of Home: House Home Access: Stairs to enter Entrance Stairs-Rails: Can reach both Entrance Stairs-Number of Steps: 2   Home Layout: Two level;Able to live on main level with bedroom/bathroom Home Equipment: Shower seat;Grab bars - tub/shower Additional Comments: info confirmed with PCA via phone call    Prior Function Prior Level of Function : Patient poor historian/Family not available             Mobility Comments: per PCA, pt does not use AD. She has had falls when unsupervised. Husband present but not able to provide any level of assist ADLs Comments: PCA provides cognitive assist, husband unable to provide any help. PCA does all IADLs     Extremity/Trunk Assessment             Cervical / Trunk Assessment Cervical / Trunk Assessment: Kyphotic (mild)  Communication   Communication Communication: No apparent difficulties    Cognition Arousal: Lethargic Behavior During Therapy: Flat affect   PT - Cognitive impairments: History of cognitive impairments                         Following commands: Impaired Following commands impaired: Follows one step commands inconsistently     Cueing Cueing Techniques: Verbal cues, Gestural cues, Tactile cues     General Comments      Exercises     Assessment/Plan    PT Assessment Patient needs continued PT services  PT Problem List         PT Treatment  Interventions DME instruction;Neuromuscular re-education;Balance training;Therapeutic exercise;Therapeutic activities;Functional mobility training;Stair training;Gait training;Patient/family education    PT Goals (Current goals can be found in the Care Plan section)  Acute Rehab PT Goals Patient Stated Goal: none stated PT Goal Formulation: Patient unable to participate in goal setting Time For Goal Achievement: 05/01/24 Potential to Achieve Goals: Fair    Frequency Min 2X/week     Co-evaluation               AM-PAC PT 6 Clicks Mobility  Outcome Measure Help needed turning from your back to your side while in a flat bed without using bedrails?: A Little Help needed moving from lying on your back to sitting on the side of a flat bed without using bedrails?: A Little Help needed moving to and from a bed to a chair (including a wheelchair)?: A Little Help needed standing up from a chair using your arms (e.g., wheelchair or bedside chair)?: A Little Help needed to walk in hospital room?: A Little Help needed climbing 3-5 steps with a railing? : A Little 6 Click Score: 18    End of Session Equipment Utilized During Treatment: Gait belt Activity Tolerance: Patient tolerated treatment well Patient left: in bed;with bed alarm set;with call bell/phone within reach;with restraints reapplied (  belt) Nurse Communication: Mobility status PT Visit Diagnosis: Muscle weakness (generalized) (M62.81);Difficulty in walking, not elsewhere classified (R26.2)    Time: 8489-8474 PT Time Calculation (min) (ACUTE ONLY): 15 min   Charges:   PT Evaluation $PT Eval Moderate Complexity: 1 Mod   PT General Charges $$ ACUTE PT VISIT: 1 Visit         Vernell DONEEN KLEIN, DPT Acute Rehabilitation Services Office [phone]  04/17/24 4:36 PM

## 2024-04-18 LAB — CBC
HCT: 34.1 % — ABNORMAL LOW (ref 36.0–46.0)
Hemoglobin: 11.2 g/dL — ABNORMAL LOW (ref 12.0–15.0)
MCH: 30.9 pg (ref 26.0–34.0)
MCHC: 32.8 g/dL (ref 30.0–36.0)
MCV: 93.9 fL (ref 80.0–100.0)
Platelets: 242 10*3/uL (ref 150–400)
RBC: 3.63 MIL/uL — ABNORMAL LOW (ref 3.87–5.11)
RDW: 13.1 % (ref 11.5–15.5)
WBC: 10.9 10*3/uL — ABNORMAL HIGH (ref 4.0–10.5)
nRBC: 0 % (ref 0.0–0.2)

## 2024-04-18 LAB — BASIC METABOLIC PANEL WITH GFR
Anion gap: 8 (ref 5–15)
BUN: 18 mg/dL (ref 8–23)
CO2: 26 mmol/L (ref 22–32)
Calcium: 8 mg/dL — ABNORMAL LOW (ref 8.9–10.3)
Chloride: 101 mmol/L (ref 98–111)
Creatinine, Ser: 0.78 mg/dL (ref 0.44–1.00)
GFR, Estimated: >60 mL/min (ref >60)
Glucose, Bld: 112 mg/dL — ABNORMAL HIGH (ref 70–99)
Potassium: 3.3 mmol/L — ABNORMAL LOW (ref 3.5–5.1)
Sodium: 135 mmol/L (ref 135–145)

## 2024-04-18 NOTE — Plan of Care (Signed)
   Problem: Nutrition: Goal: Adequate nutrition will be maintained Outcome: Progressing

## 2024-04-18 NOTE — [place] FL2 (Signed)
 [place]  MEDICAID [id number] LEVEL OF CARE FORM     IDENTIFICATION  Patient Name: Bailey [name] Birthdate: 06/04/1941 Sex: female Admission Date (Current Location): 04/16/2024  [facility] and [place] Number:  Producer, television/film/video and Address:  [facility], [address] [place], [place] [address]      Provider Number: [id number]  Attending Physician Name and Address:  Harden Jerona LULLA, MD  Relative Name and Phone Number:       Current Level of Care: Hospital Recommended Level of Care: Skilled Nursing Facility Prior Approval Number:    Date Approved/Denied:   PASRR Number: [id number]  Discharge Plan: SNF    Current Diagnoses: Patient Active Problem List   Diagnosis Date Noted   Traumatic open wound of right lower leg 04/16/2024   Hematoma of right lower leg 04/16/2024   Leg wound, right, initial encounter 04/16/2024   Closed right scapular fracture 10/12/2023   Compression fracture of body of thoracic vertebra (HCC) 10/12/2023   Fall with injury 10/11/2023   Aortic insufficiency 09/06/2017   Peripheral neuropathy 08/15/2017   Tinnitus 05/18/2014   Pleomorphic adenoma of minor salivary gland 01/13/2014   Diarrhea 01/06/2013   Nausea with vomiting 01/06/2013   Abdominal pain, right upper quadrant 01/06/2013   Palpitations 01/06/2013   Bright red blood per rectum 01/06/2013   Dehydration 01/06/2013   Hypokalemia 01/06/2013   Hyponatremia 01/06/2013   Acute renal failure (HCC) 01/06/2013   Migraine 03/14/2012   Dizziness 03/14/2012   Bicuspid aortic valve 02/27/2012   HTN (hypertension) 02/27/2012   Hyperlipidemia 02/27/2012    Orientation RESPIRATION BLADDER Height & Weight     Self  Normal Incontinent, External catheter Weight: 105 lb (47.6 kg) Height:  5' 2.5 (158.8 cm)  BEHAVIORAL SYMPTOMS/MOOD NEUROLOGICAL BOWEL NUTRITION STATUS      Continent Diet (see dc summary)  AMBULATORY STATUS COMMUNICATION OF NEEDS Skin   Extensive Assist  Verbally Other (Comment), Wound Vac (Wound -Surgical Closed Surgical Incision Leg; Negative Pressure Wound Therapy Pretibial Right;Anterior)                       Personal Care Assistance Level of Assistance  Bathing, Feeding, Dressing Bathing Assistance: Maximum assistance Feeding assistance: Maximum assistance Dressing Assistance: Maximum assistance     Functional Limitations Info  Sight, Hearing, Speech Sight Info: Adequate Hearing Info: Adequate Speech Info: Adequate    SPECIAL CARE FACTORS FREQUENCY  PT (By licensed PT), OT (By licensed OT)     PT Frequency: 5x week OT Frequency: 5x week            Contractures Contractures Info: Not present    Additional Factors Info  Code Status, Allergies, Psychotropic Code Status Info: DNR limited Allergies Info: Alendronate Sodium, Amlodipine , Aspirin, Cozaar  (Losartan ), Exelon (Rivastigmine), Fexofenadine, Hydrochlorothiazide , Ibuprofen, Ibuprofen, Lisinopril, Loratadine, Metronidazole, Olmesartan, Ondansetron , Pantoprazole  Sodium, Simvastatin Psychotropic Info: lexapro          Current Medications (04/18/2024):  This is the current hospital active medication list Current Facility-Administered Medications  Medication Dose Route Frequency Provider Last Rate Last Admin   acetaminophen  (TYLENOL ) tablet 325-650 mg  325-650 mg Oral Q6H PRN Gerome Maurilio HERO, PA-C       alum & mag hydroxide-simeth (MAALOX/MYLANTA) 200-200-20 MG/5ML suspension 15-30 mL  15-30 mL Oral Q2H PRN Gerome Maurilio M, PA-C       amLODipine  (NORVASC ) tablet 10 mg  10 mg Oral Daily Gerome Maurilio M, PA-C   10 mg at 04/17/24  1201   budesonide  (ENTOCORT EC ) 24 hr capsule 3 mg  3 mg Oral QODAY Duda, Marcus V, MD   3 mg at 04/17/24 1222   And   budesonide  (ENTOCORT EC ) 24 hr capsule 6 mg  6 mg Oral QODAY Duda, Marcus V, MD       cholecalciferol  (VITAMIN D3) 25 MCG (1000 UNIT) tablet 5,000 Units  5,000 Units Oral Daily Gerome Maurilio HERO, PA-C   1,000 Units at 04/17/24  1221   cyanocobalamin  (VITAMIN B12) tablet 1,000 mcg  1,000 mcg Oral Daily Gerome Maurilio M, PA-C   1,000 mcg at 04/17/24 1202   docusate sodium  (COLACE) capsule 100 mg  100 mg Oral Daily Gerome Maurilio M, PA-C   100 mg at 04/17/24 1159   donepezil  (ARICEPT ) tablet 5 mg  5 mg Oral Daily Gerome Maurilio M, PA-C   5 mg at 04/17/24 1200   escitalopram  (LEXAPRO ) tablet 10 mg  10 mg Oral Daily Gerome Maurilio M, PA-C   10 mg at 04/17/24 1200   furosemide  (LASIX ) tablet 20 mg  20 mg Oral q AM Gerome Maurilio HERO, PA-C   20 mg at 04/18/24 9192   guaiFENesin -dextromethorphan (ROBITUSSIN DM) 100-10 MG/5ML syrup 15 mL  15 mL Oral Q4H PRN Gerome Maurilio M, PA-C       hydrALAZINE  (APRESOLINE ) injection 5 mg  5 mg Intravenous Q20 Min PRN Gerome Maurilio M, PA-C       hydrALAZINE  (APRESOLINE ) tablet 25 mg  25 mg Oral TID Gerome Maurilio M, PA-C   25 mg at 04/17/24 2253   HYDROcodone -acetaminophen  (NORCO) 7.5-325 MG per tablet 1-2 tablet  1-2 tablet Oral Q4H PRN Gerome Maurilio HERO, PA-C       HYDROcodone -acetaminophen  (NORCO/VICODIN) 5-325 MG per tablet 1-2 tablet  1-2 tablet Oral Q4H PRN Gerome Maurilio HERO, PA-C   2 tablet at 04/16/24 2159   labetalol  (NORMODYNE ) injection 10 mg  10 mg Intravenous Q10 min PRN Gerome Maurilio M, PA-C       LORazepam  (ATIVAN ) injection 1 mg  1 mg Intravenous Q6H PRN Vernetta Lonni GRADE, MD   1 mg at 04/17/24 1843   losartan  (COZAAR ) tablet 100 mg  100 mg Oral QPM Gerome Maurilio M, PA-C   100 mg at 04/17/24 1723   magnesium  sulfate IVPB 2 g 50 mL  2 g Intravenous Daily PRN Gerome Maurilio M, PA-C       metoprolol  succinate (TOPROL -XL) 24 hr tablet 25 mg  25 mg Oral QPM Gerome Maurilio M, PA-C   25 mg at 04/17/24 1722   metoprolol  tartrate (LOPRESSOR ) injection 2-5 mg  2-5 mg Intravenous Q2H PRN Gerome Maurilio M, PA-C       morphine  (PF) 2 MG/ML injection 0.5-1 mg  0.5-1 mg Intravenous Q2H PRN Gerome Maurilio M, PA-C   1 mg at 04/16/24 2318   ondansetron  (ZOFRAN ) injection 4 mg  4 mg Intravenous Q6H PRN  Gerome Maurilio M, PA-C       oxyCODONE  (Oxy IR/ROXICODONE ) immediate release tablet 5-10 mg  5-10 mg Oral Q6H PRN Vernetta Lonni GRADE, MD   10 mg at 04/17/24 2253   pantoprazole  (PROTONIX ) EC tablet 40 mg  40 mg Oral QAC breakfast Gerome Maurilio M, PA-C   40 mg at 04/18/24 0807   phenol (CHLORASEPTIC) mouth spray 1 spray  1 spray Mouth/Throat PRN Gerome Maurilio HERO, PA-C       potassium chloride  SA (KLOR-CON  M) CR tablet 20-40 mEq  20-40 mEq Oral Daily PRN Gerome Maurilio  M, PA-C         Discharge Medications: Please see discharge summary for a list of discharge medications.  Relevant Imaging Results:  Relevant Lab Results:   Additional Information SSN: [id number], wound vac (Prevena plus portable)  Danial Sisley C Aziah Brostrom, LCSWA

## 2024-04-18 NOTE — TOC Initial Note (Signed)
 Transition of Care [facility]) - Initial/Assessment Note    Patient Details  Name: Bailey [name] MRN: [id number] Date of Birth: 1941/02/07  [facility]) CM/SW Contact:    Luise JAYSON Pan, LCSWA Phone Number: 04/18/2024, 10:34 AM  Clinical Narrative:   Patient oriented x1. CSW contacted patients spouse, Alm, to discuss PT recs for SNF. Their caregiver, Luke was present as well. Kim stated patient has previously been to [facility] and would more than likely want to return. Luke states she is going to contact patients daughter, Corean (who works), to inquire about any other facilities she'd like a referral sent to. CSW awaiting follow up call. Will complete workup at this time.   TOC will continue to follow.      Expected Discharge Plan: Skilled Nursing Facility Barriers to Discharge: Continued Medical Work up, SNF Pending bed offer, Insurance Authorization   Patient Goals and CMS Choice Patient states their goals for this hospitalization and ongoing recovery are:: Go to rehab          Expected Discharge Plan and Services In-house Referral: Clinical Social Work     Living arrangements for the past 2 months: Single Family Home                                      Prior Living Arrangements/Services Living arrangements for the past 2 months: Single Family Home Lives with:: Spouse Patient language and need for interpreter reviewed:: Yes Do you feel safe going back to the place where you live?: Yes      Need for Family Participation in Patient Care: Yes (Comment) Care giver support system in place?: Yes (comment)   Criminal Activity/Legal Involvement Pertinent to Current Situation/Hospitalization: No - Comment as needed  Activities of Daily Living   ADL Screening (condition at time of admission) Independently performs ADLs?: No Does the patient have a NEW difficulty with bathing/dressing/toileting/self-feeding that is expected to last >3 days?: Yes  (Initiates electronic notice to provider for possible OT consult) Does the patient have a NEW difficulty with getting in/out of bed, walking, or climbing stairs that is expected to last >3 days?: No Does the patient have a NEW difficulty with communication that is expected to last >3 days?: No Is the patient deaf or have difficulty hearing?: No Does the patient have difficulty seeing, even when wearing glasses/contacts?: No Does the patient have difficulty concentrating, remembering, or making decisions?: No  Permission Sought/Granted Permission sought to share information with : Facility Medical sales representative, Family Supports Permission granted to share information with : No (Patient family in chart)  Share Information with NAME: Alm  Permission granted to share info w AGENCY: SNFs  Permission granted to share info w Relationship: Spouse  Permission granted to share info w Contact Information: [phone]  Emotional Assessment Appearance:: Appears stated age Attitude/Demeanor/Rapport: Unable to Assess Affect (typically observed): Unable to Assess Orientation: : Oriented to Self Alcohol  / Substance Use: Not Applicable Psych Involvement: No (comment)  Admission diagnosis:  Ulcer of right lower extremity, unspecified ulcer stage (HCC) [L97.919] Traumatic open wound of right lower leg [S81.801A] Leg wound, right, initial encounter [S81.801A] Patient Active Problem List   Diagnosis Date Noted   Traumatic open wound of right lower leg 04/16/2024   Hematoma of right lower leg 04/16/2024   Leg wound, right, initial encounter 04/16/2024   Closed right scapular fracture 10/12/2023   Compression fracture of body  of thoracic vertebra (HCC) 10/12/2023   Fall with injury 10/11/2023   Aortic insufficiency 09/06/2017   Peripheral neuropathy 08/15/2017   Tinnitus 05/18/2014   Pleomorphic adenoma of minor salivary gland 01/13/2014   Diarrhea 01/06/2013   Nausea with vomiting 01/06/2013    Abdominal pain, right upper quadrant 01/06/2013   Palpitations 01/06/2013   Bright red blood per rectum 01/06/2013   Dehydration 01/06/2013   Hypokalemia 01/06/2013   Hyponatremia 01/06/2013   Acute renal failure (HCC) 01/06/2013   Migraine 03/14/2012   Dizziness 03/14/2012   Bicuspid aortic valve 02/27/2012   HTN (hypertension) 02/27/2012   Hyperlipidemia 02/27/2012   PCP:  Shayne Anes, MD Pharmacy:   [facility] [id number]), [place] [address] [place] ([place]) [place] [address] Phone: [phone] Fax: [phone]     Social Drivers of Health (SDOH) Social History: SDOH Screenings   Food Insecurity: No Food Insecurity (04/17/2024)  Housing: Low Risk  (04/17/2024)  Transportation Needs: No Transportation Needs (04/17/2024)  Utilities: Not At Risk (04/17/2024)  Depression (PHQ2-9): Low Risk  (10/10/2018)  Social Connections: Unknown (04/17/2024)  Tobacco Use: Low Risk  (04/16/2024)   SDOH Interventions:     Readmission Risk Interventions     No data to display

## 2024-04-18 NOTE — Progress Notes (Signed)
 [facility]  Subjective  - No new complaints.  She does state she has pain when her right leg is moved around.   Objective (!) 142/70 (!) 51 98.1 F (36.7 C) (Oral) 16 94%  Intake/Output Summary (Last 24 hours) at 04/18/2024 1148 Last data filed at 04/18/2024 1057 Gross per 24 hour  Intake 780 ml  Output 101 ml  Net 679 ml    Vace without good suction, clogged tubing. New vac placed with good suction. Compartments soft, no active bleeding at incision site.   Assessment/Planning: Right leg traumatic wound s/p debridement large hematoma right leg with local tissue transfer and reinforcement with biologic tissue graft.   Restraints were removed for breakfast Resting comfortably No new OP in canister due to tube clogging. Plan to maintain vac while in the hospital then will discontinue at discharge.       Maurilio Deland Collet 04/18/2024 11:48 AM --  Laboratory Lab Results: Recent Labs    04/17/24 0626 04/18/24 0803  WBC 9.0 10.9*  HGB 11.2* 11.2*  HCT 34.6* 34.1*  PLT 170 242   BMET Recent Labs    04/17/24 0626 04/18/24 0803  NA 135 135  K 3.9 3.3*  CL 102 101  CO2 20* 26  GLUCOSE 121* 112*  BUN 19 18  CREATININE 0.72 0.78  CALCIUM 7.5* 8.0*    COAG Lab Results  Component Value Date   INR 1.0 06/13/2021   No results found for: PTT

## 2024-04-19 NOTE — Plan of Care (Signed)
  Problem: Activity: Goal: Risk for activity intolerance will decrease Outcome: Progressing   Problem: Coping: Goal: Level of anxiety will decrease Outcome: Progressing   Problem: Pain Managment: Goal: General experience of comfort will improve and/or be controlled Outcome: Progressing   Problem: Safety: Goal: Ability to remain free from injury will improve Outcome: Progressing   Problem: Skin Integrity: Goal: Risk for impaired skin integrity will decrease Outcome: Progressing   Problem: Pain Management: Goal: Pain level will decrease with appropriate interventions Outcome: Progressing   Problem: Safety: Goal: Non-violent Restraint(s) Outcome: Progressing   Problem: Nutrition: Goal: Adequate nutrition will be maintained Outcome: Not Progressing

## 2024-04-19 NOTE — Progress Notes (Signed)
 Patient ID: Bailey [name], female   DOB: 09/09/1941, 83 y.o.   MRN: [id number] Patient is s/p debridement large hematoma right leg with local tissue transfer and reinforcement with biologic tissue graft.  Sitting comfortably in bed with breakfast. Bottom sponge saturated in wound VAC canister with a good suction fit.  Patient will need to be admitted to inpatient for continued medical treatment. Pending placement to SNF.

## 2024-04-20 NOTE — Progress Notes (Signed)
 Patient became agitated with mild anxiety wanting to leave and attempting to remove soft belt restraint and pull on wound vac cord. After unsuccessful redirection, PRN Ativan  was administered. Tolerated well, bed in lowest position, bed alarm on, call light within reach, and all needs met at this time.

## 2024-04-20 NOTE — Plan of Care (Signed)
 Pt still in waist restraint because trying to get up and disrupt VAC on RLE. Pt has great appetite for dinner.

## 2024-04-20 NOTE — Progress Notes (Signed)
 Patient ID: Bailey [name], female   DOB: 1941-03-22, 83 y.o.   MRN: [id number] Patient with an unwitnessed fall overnight. Mild abrasions/skin tears about left elbow, padded dressing placed over this. Patient is s/p debridement large hematoma right leg with local tissue transfer and reinforcement with biologic tissue graft.  Sitting comfortably in bed with breakfast. Bottom sponge saturated in wound VAC canister with a good suction fit.  Patient will need to be admitted to inpatient for continued medical treatment. Pending placement to SNF.

## 2024-04-20 NOTE — Plan of Care (Signed)
   Problem: Coping: Goal: Level of anxiety will decrease Outcome: Progressing   Problem: Pain Managment: Goal: General experience of comfort will improve and/or be controlled Outcome: Progressing

## 2024-04-20 NOTE — Progress Notes (Signed)
 04/20/24 0600  What Happened  Was fall witnessed? No  Was patient injured? Yes  Patient found on floor  Found by Staff-comment  Stated prior activity other (comment)  Provider Notification  Provider Name/Title Harden Lame MD (via answering service)  Date Provider Notified 04/20/24  Time Provider Notified [phone]  Method of Notification Call  Notification Reason Fall  Provider response Other (Comment) (awaiting for callback.)  Follow Up  Family notified Yes - comment (left message to son via voicemail.)  Time family notified 0655  Additional tests No  Simple treatment Dressing  Progress note created (see row info) Yes  Adult Fall Risk Assessment  Risk Factor Category (scoring not indicated) High fall risk per protocol (document High fall risk)  Age 83  Fall History: Fall within 6 months prior to admission 5  Elimination; Bowel and/or Urine Incontinence 0  Elimination; Bowel and/or Urine Urgency/Frequency 0  Medications: includes PCA/Opiates, Anti-convulsants, Anti-hypertensives, Diuretics, Hypnotics, Laxatives, Sedatives, and Psychotropics 5  Patient Care Equipment 1  Mobility-Assistance 2  Mobility-Gait 2  Mobility-Sensory Deficit 0  Altered awareness of immediate physical environment 1  Impulsiveness 2  Lack of understanding of one's physical/cognitive limitations 4  Total Score 25  Patient Fall Risk Level High fall risk  Adult Fall Risk Interventions  Required Bundle Interventions *See Row Information* High fall risk - low, moderate, and high requirements implemented  Additional Interventions Use of appropriate toileting equipment (bedpan, BSC, etc.)  Fall intervention(s) refused/Patient educated regarding refusal Bed alarm  Screening for Fall Injury Risk (To be completed on HIGH fall risk patients) - Assessing Need for Floor Mats  Risk For Fall Injury- Criteria for Floor Mats Confusion/dementia (+NuDESC, CIWA, TBI, etc.)  Will Implement Floor Mats Yes  Vitals  BP  101/69  MAP (mmHg) 80  BP Location Right Arm  BP Method Automatic  Patient Position (if appropriate) Sitting  Pulse Rate 72  Pulse Rate Source Dinamap  Resp 19  Oxygen Therapy  SpO2 97 %  O2 Device Room Air  Pain Assessment  Pain Scale PAINAD  PAINAD (Pain Assessment in Advanced Dementia)  Breathing 0  Negative Vocalization 0  Facial Expression 0  Body Language 0  Consolability 1  PAINAD Score 1  Neurological  Neuro (WDL) X  Level of Consciousness Alert  Orientation Level Oriented to person;Disoriented to place;Disoriented to time;Disoriented to situation  Cognition Impulsive;Poor attention/concentration;Poor judgement;Poor safety awareness;Unable to follow commands;Memory impairment  Speech Clear  R Pupil Size (mm) 3  R Pupil Shape Round  R Pupil Reaction Brisk  L Pupil Size (mm) 3  L Pupil Shape Round  L Pupil Reaction Brisk  Motor Function/Sensation Assessment Grip;Motor response;Motor strength  R Hand Grip Moderate  L Hand Grip Moderate  RUE Motor Response Purposeful movement  RUE Motor Strength 4  LUE Motor Response Purposeful movement  LUE Motor Strength 4  RLE Motor Response Purposeful movement  RLE Motor Strength 4  LLE Motor Response Purposeful movement  LLE Motor Strength 4  Neuro Symptoms Anxiety;Agitation;Forgetful;Wandering  Neuro symptoms relieved by Anti-anxiety medication  Musculoskeletal  Musculoskeletal (WDL) X  Assistive Device Other (Comment)  Generalized Weakness Yes  Weight Bearing Restrictions Per Provider Order Yes  Musculoskeletal Details  RLE Surgery  Right Lower Leg Injury/trauma;Surgery  Integumentary  Integumentary (WDL) X  Skin Color Appropriate for ethnicity  Skin Condition Dry  Skin Integrity Erythema/redness;Other (Comment) (skin tear on Left Elbow)  Ecchymosis Location Arm;Leg  Ecchymosis Location Orientation Bilateral  Erythema/Redness Location Arm;Leg  Erythema/Redness Location Orientation  Bilateral  Skin Turgor  Non-tenting   Patient found by another RN on the floor at the foot of the bed. Bed alarm did not go off and patient able to get out of posey belt. Patient denies pain at this time however patient is confused at baseline. Vitals signs obtained and stable. Skin tear on left elbow noted and dressed. Attempted to call family, able to leave VM in Son's phone. Got in touch with caregiver. No answer for daughter and spouse. Called Dr. Crist answering service left message.

## 2024-04-21 NOTE — TOC Progression Note (Addendum)
 Transition of Care [facility]) - Progression Note    Patient Details  Name: Bailey [name] MRN: [id number] Date of Birth: 1941/07/02  [facility]) CM/SW Contact  Bailey [name], [place] Phone Number: 04/21/2024, 9:19 AM  Clinical Narrative:     [facility]; [facility] will review referral and update CSW with decision. Pt will need insurance auth once a bed offer is received. TOC will continue to follow.   1030: CSW called pt's daughter to discuss disposition. She explains she initially had the impression that pt could go home because the wound vac was coming off. CSW explained that despite the wound vac coming off, PT still recommended rehab.Other SNFs that daughter is interested in are [facility] and [facility]. CSW explained that pt's dementia symptoms my be a barrier for the facilities daughter wants. Daughter is not interested in other,generally lower rated facilities.   She is hoping to see how pt does with therapy today and see if she can go home with [facility]. She lives with husband and they have a caregiver come in daily just no one in the evening. TOC will continue to follow. \  1530: [facility], and [facility] all declined. CSW called pt's spouse and discussed how pt did with OT. Daughter does not feel pt's spouse can assist pt in the evenings at home as he is unsteady on his feet. CSW discussed options between paying privately for caregivers at home or expanding SNF bed search to a much wider selection of SNFs. Daughter opts to expand SNF search. CSW sent additional snf referrals.    Expected Discharge Plan: Skilled Nursing Facility Barriers to Discharge: SNF Pending bed offer, Insurance Authorization  Expected Discharge Plan and Services In-house Referral: Clinical Social Work     Living arrangements for the past 2 months: Single Family Home Expected Discharge Date: 04/21/24                                     Social Determinants of  Health (SDOH) Interventions SDOH Screenings   Food Insecurity: No Food Insecurity (04/17/2024)  Housing: Low Risk  (04/17/2024)  Transportation Needs: No Transportation Needs (04/17/2024)  Utilities: Not At Risk (04/17/2024)  Depression (PHQ2-9): Low Risk  (10/10/2018)  Social Connections: Unknown (04/17/2024)  Tobacco Use: Low Risk  (04/16/2024)    Readmission Risk Interventions     No data to display

## 2024-04-21 NOTE — Progress Notes (Signed)
 Occupational Therapy Treatment Patient Details Name: Bailey [name] MRN: [id number] DOB: [birth year] Today's Date: 04/21/2024   History of present illness Bailey [name] is a 83 yo female who presented 6/25 for planned sx after a fall related injury that occurred 03/13/24. 6/25 debridement large hematoma right leg with local tissue transfer and reinforcement with biologic tissue graft. PMH of HTN, bicuspid aortic valve with moderate AI, migraine, dementia, GERD, arthritis, anxiety.   OT comments  Pt resting in bed, restraints applied, Pt pleasantly confused. Pt able to participate fully with constant verbal cueing for task initiation, Pt not aware of deficits, poor safety awareness. Pt CGA to min A for most ADLs, able to don/doff L sock, min A for R sock due to mild pain with RLE, overall fair strength and ROM to complete tasks. Pt supervision for bed mobility, able to roll L/R, sit up in bed, and complete bridging to don/doff pants at bed level or sitting EOB. Pt CGA for sit to stand and ambulation around room with RW for support, no overt LOB. Pt tolerated session well. Continue to recommend postacute rehab <3hrs/day unless Pt is able to have 24/7 supervision at home for safety as she will likely wander and is at high risk for further falls unsupervised, will need at least min A for ADLs/mobility with RW.  Pt has shower seat and gab bars at home, will need RW for safety with ambulation upon return home, may need BSC to maximize safety. Will continue to see acutely to progress as able.       If plan is discharge home, recommend the following:  A lot of help with walking and/or transfers;Two people to help with bathing/dressing/bathroom;Assistance with cooking/housework;Assistance with feeding;Direct supervision/assist for medications management;Direct supervision/assist for financial management;Assist for transportation;Help with stairs or ramp for entrance;Supervision due to cognitive status    Equipment Recommendations  BSC/3in1;Other (comment) (RW)    Recommendations for Other Services      Precautions / Restrictions Precautions Precautions: Fall Recall of Precautions/Restrictions: Impaired Precaution/Restrictions Comments: wound vac, posery belt, bilat mits Restrictions Weight Bearing Restrictions Per Provider Order: Yes RLE Weight Bearing Per Provider Order: Weight bearing as tolerated       Mobility Bed Mobility Overal bed mobility: Needs Assistance Bed Mobility: Supine to Sit, Sit to Supine     Supine to sit: Supervision Sit to supine: Supervision   General bed mobility comments: supervsiion for safety, cueing for initiation, able to roll, bridge at supervision level, completed LB dressing at bed level or EOB with min A.    Transfers Overall transfer level: Needs assistance Equipment used: Rolling walker (2 wheels) Transfers: Sit to/from Stand, Bed to chair/wheelchair/BSC Sit to Stand: Contact guard assist     Step pivot transfers: Contact guard assist     General transfer comment: CGA and verbal cueing for safety     Balance Overall balance assessment: Needs assistance Sitting-balance support: No upper extremity supported, Feet supported Sitting balance-Leahy Scale: Fair Sitting balance - Comments: CGA for safety, able to lean L/R, recover sitting balance, complete LB ADLs   Standing balance support: Single extremity supported, During functional activity Standing balance-Leahy Scale: Poor Standing balance comment: able to stand unsupported but poor safety awareness needing CGA                           ADL either performed or assessed with clinical judgement   ADL Overall ADL's : Needs assistance/impaired Eating/Feeding: Set up;Supervision/ safety;Sitting  Grooming: Set up;Supervision/safety;Sitting;Standing   Upper Body Bathing: Set up;Minimal assistance;Cueing for sequencing;Sitting   Lower Body Bathing: Minimal  assistance;Sitting/lateral leans;Cueing for sequencing   Upper Body Dressing : Set up;Cueing for sequencing;Sitting   Lower Body Dressing: Minimal assistance;Cueing for sequencing;Sitting/lateral leans;Sit to/from stand   Toilet Transfer: Contact guard assist;Rolling walker (2 wheels)   Toileting- Clothing Manipulation and Hygiene: Set up;Minimal assistance;Cueing for sequencing;Sitting/lateral lean;Sit to/from stand       Functional mobility during ADLs: Contact guard assist;Rolling walker (2 wheels) General ADL Comments: CGA to min A for ADLs, mainly due to poor cognition, verbal cues for intiation and task completion, overall good ROM and strength to complete ADL tasks.    Extremity/Trunk Assessment Upper Extremity Assessment Upper Extremity Assessment: Generalized weakness            Vision       Perception     Praxis     Communication Communication Communication: No apparent difficulties   Cognition Arousal: Alert Behavior During Therapy: Flat affect Cognition: History of cognitive impairments             OT - Cognition Comments: hx of dementia, pt able to state her name otherwise discoriented. She followed a few one step directions but ultimately needed maximal multimodal cues to participate. Home VS SNF, will need 24/7 supervision for home as she will likely wander, poor safety awareness and awareness of deficits.                 Following commands: Impaired Following commands impaired: Follows one step commands inconsistently      Cueing   Cueing Techniques: Verbal cues, Gestural cues  Exercises      Shoulder Instructions       General Comments      Pertinent Vitals/ Pain       Pain Assessment Pain Assessment: Faces Faces Pain Scale: Hurts a little bit Pain Location: Rt leg Pain Descriptors / Indicators: Discomfort, Sore Pain Intervention(s): Monitored during session  Home Living                                           Prior Functioning/Environment              Frequency  Min 1X/week        Progress Toward Goals  OT Goals(current goals can now be found in the care plan section)  Progress towards OT goals: Progressing toward goals  Acute Rehab OT Goals Patient Stated Goal: unable to participate in setting goals OT Goal Formulation: Patient unable to participate in goal setting Time For Goal Achievement: 05/01/24 Potential to Achieve Goals: Good ADL Goals Pt Will Perform Grooming: with supervision;standing Pt Will Perform Upper Body Dressing: with supervision Pt Will Perform Lower Body Dressing: with min assist;sit to/from stand Pt Will Transfer to Toilet: with supervision;ambulating  Plan      Co-evaluation                 AM-PAC OT 6 Clicks Daily Activity     Outcome Measure   Help from another person eating meals?: A Little Help from another person taking care of personal grooming?: A Little Help from another person toileting, which includes using toliet, bedpan, or urinal?: A Little Help from another person bathing (including washing, rinsing, drying)?: A Little Help from another person to put on and taking off regular upper body clothing?:  A Little Help from another person to put on and taking off regular lower body clothing?: A Little 6 Click Score: 18    End of Session Equipment Utilized During Treatment: Gait belt;Rolling walker (2 wheels)  OT Visit Diagnosis: Unsteadiness on feet (R26.81);Other abnormalities of gait and mobility (R26.89);Repeated falls (R29.6);Muscle weakness (generalized) (M62.81);History of falling (Z91.81);Pain Pain - Right/Left: Right Pain - part of body: Leg   Activity Tolerance Patient tolerated treatment well   Patient Left in bed;with call bell/phone within reach;with bed alarm set;with restraints reapplied   Nurse Communication Mobility status        Time: 1345-1415 OT Time Calculation (min): 30 min  Charges: OT General  Charges $OT Visit: 1 Visit OT Treatments $Self Care/Home Management : 23-37 mins  Panfilo Ketchum, OTR/L   Jax Kentner R Farhad Burleson 04/21/2024, 2:35 PM

## 2024-04-21 NOTE — Care Management Important Message (Signed)
 Important Message  Patient Details  Name: Bailey [name] MRN: [id number] Date of Birth: Mar 03, 1941   Important Message Given:  Yes - Medicare IM     Jon Cruel 04/21/2024, 12:38 PM

## 2024-04-21 NOTE — Progress Notes (Signed)
 Patient ID: Bailey [name], female   DOB: 09-01-1941, 83 y.o.   MRN: [id number] Patient is seen in follow-up for traumatic wound right lower extremity.  There is a total of 200 cc in the wound VAC canister.  Will plan for to discontinue the wound VAC dressing today apply a dry dressing and discharge to home.

## 2024-04-21 NOTE — Discharge Summary (Signed)
 Physician Discharge Summary  Patient ID: MEOSHIA BILLING MRN: [id number] DOB/AGE: 82/04/1941 82 y.o.  Admit date: 04/16/2024 Discharge date: 04/21/2024  Admission Diagnoses:  Principal Problem:   Traumatic open wound of right lower leg Active Problems:   Hematoma of right lower leg   Leg wound, right, initial encounter   Discharge Diagnoses:  Same  Past Medical History:  Diagnosis Date   Anxiety    Aortic insufficiency    Arthritis    hands (01/06/2013)   Basal cell carcinoma of skin    Bicuspid aortic valve    Cataract    right   Colitis, collagenous summer of 2013   Colon polyp    DDD (degenerative disc disease) 12/2009   Dementia (HCC)    GERD (gastroesophageal reflux disease)    /endoscopy; doesn't bother me at all (01/06/2013)   Hard of hearing    bilateral - no hearing aids   Heart murmur    Hyperlipidemia    Hypertension    Left facial numbness    previous, not current   Migraine    used to have once/month; none in years (01/06/2013)   Palpitations    Pneumonia ~ 1944; ~ 2009   Tinnitus    Vertigo    hx    Surgeries: Procedure(s): INCISION AND DRAINAGE OF DEEP ABSCESS,  RIGHT LOWER EXTREMITY on 04/16/2024   Consultants:   Discharged Condition: Improved  Hospital Course: FLEETA KUNDE is an 83 y.o. female who was admitted 04/16/2024 with a chief complaint of No chief complaint on file. , and found to have a diagnosis of Traumatic open wound of right lower leg.  They were brought to the operating room on 04/16/2024 and underwent the above named procedures.    They were given perioperative antibiotics:  Anti-infectives (From admission, onward)    None     .  They were given compression stockings, early ambulation, and chemoprophylaxis for DVT prophylaxis.  They benefited maximally from their hospital stay and there were no complications.    Recent vital signs:  Vitals:   04/21/24 0439 04/21/24 0739  BP: (!) 155/71 (!) 147/95  Pulse:  (!) 54 76  Resp: 16 16  Temp: 98.1 F (36.7 C) 98.7 F (37.1 C)  SpO2: 99% 100%    Recent laboratory studies:  Results for orders placed or performed during the hospital encounter of 04/16/24  Basic metabolic panel per protocol   Collection Time: 04/16/24 12:48 PM  Result Value Ref Range   Sodium 142 135 - 145 mmol/L   Potassium 3.4 (L) 3.5 - 5.1 mmol/L   Chloride 104 98 - 111 mmol/L   CO2 26 22 - 32 mmol/L   Glucose, Bld 97 70 - 99 mg/dL   BUN 15 8 - 23 mg/dL   Creatinine, Ser 9.14 0.44 - 1.00 mg/dL   Calcium 8.4 (L) 8.9 - 10.3 mg/dL   GFR, Estimated >39 >39 mL/min   Anion gap 12 5 - 15  CBC per protocol   Collection Time: 04/16/24 12:48 PM  Result Value Ref Range   WBC 6.2 4.0 - 10.5 K/uL   RBC 3.82 (L) 3.87 - 5.11 MIL/uL   Hemoglobin 11.8 (L) 12.0 - 15.0 g/dL   HCT 63.4 63.9 - 53.9 %   MCV 95.5 80.0 - 100.0 fL   MCH 30.9 26.0 - 34.0 pg   MCHC 32.3 30.0 - 36.0 g/dL   RDW 86.8 88.4 - 84.4 %   Platelets 184 150 - 400 K/uL  nRBC 0.0 0.0 - 0.2 %  Basic metabolic panel   Collection Time: 04/17/24  6:26 AM  Result Value Ref Range   Sodium 135 135 - 145 mmol/L   Potassium 3.9 3.5 - 5.1 mmol/L   Chloride 102 98 - 111 mmol/L   CO2 20 (L) 22 - 32 mmol/L   Glucose, Bld 121 (H) 70 - 99 mg/dL   BUN 19 8 - 23 mg/dL   Creatinine, Ser 9.27 0.44 - 1.00 mg/dL   Calcium 7.5 (L) 8.9 - 10.3 mg/dL   GFR, Estimated >39 >39 mL/min   Anion gap 13 5 - 15  CBC   Collection Time: 04/17/24  6:26 AM  Result Value Ref Range   WBC 9.0 4.0 - 10.5 K/uL   RBC 3.68 (L) 3.87 - 5.11 MIL/uL   Hemoglobin 11.2 (L) 12.0 - 15.0 g/dL   HCT 65.3 (L) 63.9 - 53.9 %   MCV 94.0 80.0 - 100.0 fL   MCH 30.4 26.0 - 34.0 pg   MCHC 32.4 30.0 - 36.0 g/dL   RDW 87.1 88.4 - 84.4 %   Platelets 170 150 - 400 K/uL   nRBC 0.0 0.0 - 0.2 %  Basic metabolic panel   Collection Time: 04/18/24  8:03 AM  Result Value Ref Range   Sodium 135 135 - 145 mmol/L   Potassium 3.3 (L) 3.5 - 5.1 mmol/L   Chloride 101 98 -  111 mmol/L   CO2 26 22 - 32 mmol/L   Glucose, Bld 112 (H) 70 - 99 mg/dL   BUN 18 8 - 23 mg/dL   Creatinine, Ser 9.21 0.44 - 1.00 mg/dL   Calcium 8.0 (L) 8.9 - 10.3 mg/dL   GFR, Estimated >39 >39 mL/min   Anion gap 8 5 - 15  CBC   Collection Time: 04/18/24  8:03 AM  Result Value Ref Range   WBC 10.9 (H) 4.0 - 10.5 K/uL   RBC 3.63 (L) 3.87 - 5.11 MIL/uL   Hemoglobin 11.2 (L) 12.0 - 15.0 g/dL   HCT 65.8 (L) 63.9 - 53.9 %   MCV 93.9 80.0 - 100.0 fL   MCH 30.9 26.0 - 34.0 pg   MCHC 32.8 30.0 - 36.0 g/dL   RDW 86.8 88.4 - 84.4 %   Platelets 242 150 - 400 K/uL   nRBC 0.0 0.0 - 0.2 %    Discharge Medications:   Allergies as of 04/21/2024       Reactions   Alendronate Sodium Other (See Comments)   Made the hips/legs hurt   Amlodipine  Other (See Comments)   Feet swelling and hot flashes   Aspirin Other (See Comments)   Scleral hemorrhages   Cozaar  [losartan ] Other (See Comments)   Face broke out   Exelon [rivastigmine] Other (See Comments)   Just felt badly when taking it   Fexofenadine Other (See Comments)   Made her aware of heart beating   Hydrochlorothiazide  Other (See Comments)   Felt it contributed to hair falling out 7/12   Ibuprofen Nausea And Vomiting, Other (See Comments)   Vertigo, also   Ibuprofen Nausea And Vomiting, Other (See Comments)   Also, Dizziness, GI Intolerance   Lisinopril Other (See Comments)   Hair was falling out on it   Loratadine Other (See Comments)   Unknown   Metronidazole Diarrhea, Nausea And Vomiting, Other (See Comments)   GI Intolerance   Olmesartan Other (See Comments)   I wonder if it contributed to her enteropathy in 2014.  so will never give this again.   Ondansetron  Nausea And Vomiting, Other (See Comments)   GI Intolerance   Pantoprazole  Sodium Other (See Comments)   Bloating   Simvastatin Nausea And Vomiting, Other (See Comments)   Stomach upset        Medication List     TAKE these medications    amLODipine  5  MG tablet Commonly known as: NORVASC  Take 5 mg by mouth daily.   budesonide  3 MG 24 hr capsule Commonly known as: ENTOCORT EC  Take 3-6 mg by mouth See admin instructions. Take 3 mg by mouth once a day, ALTERNATING WITH 6 mg every other day   cyanocobalamin  1000 MCG tablet Commonly known as: VITAMIN B12 Take 1,000 mcg by mouth daily.   donepezil  5 MG tablet Commonly known as: ARICEPT  Take 5 mg by mouth daily.   escitalopram  10 MG tablet Commonly known as: LEXAPRO  Take 10 mg by mouth daily.   furosemide  20 MG tablet Commonly known as: LASIX  Take 20 mg by mouth in the morning.   hydrALAZINE  25 MG tablet Commonly known as: APRESOLINE  Take 1 tablet (25 mg total) by mouth 3 (three) times daily.   losartan  100 MG tablet Commonly known as: COZAAR  Take 100 mg by mouth daily.   metoprolol  succinate 25 MG 24 hr tablet Commonly known as: TOPROL -XL Take 1 tablet (25 mg total) by mouth daily.   pantoprazole  40 MG tablet Commonly known as: PROTONIX  Take 40 mg by mouth daily before breakfast.   potassium chloride  10 MEQ tablet Commonly known as: KLOR-CON  Take 10 mEq by mouth See admin instructions. Take 10 mEq by mouth in the morning and afternoon   Vitamin D3 125 MCG (5000 UT) Tabs Take 5,000 Units by mouth daily.        Diagnostic Studies: No results found.  Disposition: Discharge disposition: 01-Home or Self Care       Discharge Instructions     Call MD / Call 911   Complete by: As directed    If you experience chest pain or shortness of breath, CALL 911 and be transported to the hospital emergency room.  If you develope a fever above 101 F, pus (white drainage) or increased drainage or redness at the wound, or calf pain, call your surgeon's office.   Constipation Prevention   Complete by: As directed    Drink plenty of fluids.  Prune juice may be helpful.  You may use a stool softener, such as Colace (over the counter) 100 mg twice a day.  Use MiraLax  (over the  counter) for constipation as needed.   Diet - low sodium heart healthy   Complete by: As directed    Increase activity slowly as tolerated   Complete by: As directed    Post-operative opioid taper instructions:   Complete by: As directed    POST-OPERATIVE OPIOID TAPER INSTRUCTIONS: It is important to wean off of your opioid medication as soon as possible. If you do not need pain medication after your surgery it is ok to stop day one. Opioids include: Codeine, Hydrocodone (Norco, Vicodin), Oxycodone (Percocet, oxycontin ) and hydromorphone amongst others.  Long term and even short term use of opiods can cause: Increased pain response Dependence Constipation Depression Respiratory depression And more.  Withdrawal symptoms can include Flu like symptoms Nausea, vomiting And more Techniques to manage these symptoms Hydrate well Eat regular healthy meals Stay active Use relaxation techniques(deep breathing, meditating, yoga) Do Not substitute Alcohol  to help with tapering If you have been  on opioids for less than two weeks and do not have pain than it is ok to stop all together.  Plan to wean off of opioids This plan should start within one week post op of your joint replacement. Maintain the same interval or time between taking each dose and first decrease the dose.  Cut the total daily intake of opioids by one tablet each day Next start to increase the time between doses. The last dose that should be eliminated is the evening dose.             Signed: Jerona LULLA Sage 04/21/2024, 8:08 AM

## 2024-04-21 NOTE — Plan of Care (Signed)

## 2024-04-22 NOTE — Progress Notes (Signed)
 Patient ID: Bailey [name], female   DOB: 07/02/1941, 83 y.o.   MRN: [id number] The wound VAC is off dry dressing in place.  Plan for discharge to home today.

## 2024-04-22 NOTE — Discharge Summary (Signed)
 Physician Discharge Summary  Patient ID: Bailey [name] MRN: [id number] DOB/AGE: 82/04/1941 82 y.o.  Admit date: 04/16/2024 Discharge date: 04/22/2024  Admission Diagnoses:  Principal Problem:   Traumatic open wound of right lower leg Active Problems:   Hematoma of right lower leg   Leg wound, right, initial encounter   Discharge Diagnoses:  Same  Past Medical History:  Diagnosis Date   Anxiety    Aortic insufficiency    Arthritis    hands (01/06/2013)   Basal cell carcinoma of skin    Bicuspid aortic valve    Cataract    right   Colitis, collagenous summer of 2013   Colon polyp    DDD (degenerative disc disease) 12/2009   Dementia (HCC)    GERD (gastroesophageal reflux disease)    /endoscopy; doesn't bother me at all (01/06/2013)   Hard of hearing    bilateral - no hearing aids   Heart murmur    Hyperlipidemia    Hypertension    Left facial numbness    previous, not current   Migraine    used to have once/month; none in years (01/06/2013)   Palpitations    Pneumonia ~ 1944; ~ 2009   Tinnitus    Vertigo    hx    Surgeries: Procedure(s): INCISION AND DRAINAGE OF DEEP ABSCESS,  RIGHT LOWER EXTREMITY on 04/16/2024   Consultants:   Discharged Condition: Improved  Hospital Course: Bailey [name] is an 83 y.o. female who was admitted 04/16/2024 with a chief complaint of No chief complaint on file. , and found to have a diagnosis of Traumatic open wound of right lower leg.  They were brought to the operating room on 04/16/2024 and underwent the above named procedures.    They were given perioperative antibiotics:  Anti-infectives (From admission, onward)    None     .  They were given compression stockings, early ambulation, and chemoprophylaxis for DVT prophylaxis.  They benefited maximally from their hospital stay and there were no complications.    Recent vital signs:  Vitals:   04/21/24 2003 04/22/24 0700  BP: (!) 130/59 (!) 153/75  Pulse:  (!) 59 (!) 56  Resp: 18 17  Temp: 98.1 F (36.7 C) 97.7 F (36.5 C)  SpO2: 100% 98%    Recent laboratory studies:  Results for orders placed or performed during the hospital encounter of 04/16/24  Basic metabolic panel per protocol   Collection Time: 04/16/24 12:48 PM  Result Value Ref Range   Sodium 142 135 - 145 mmol/L   Potassium 3.4 (L) 3.5 - 5.1 mmol/L   Chloride 104 98 - 111 mmol/L   CO2 26 22 - 32 mmol/L   Glucose, Bld 97 70 - 99 mg/dL   BUN 15 8 - 23 mg/dL   Creatinine, Ser 9.14 0.44 - 1.00 mg/dL   Calcium 8.4 (L) 8.9 - 10.3 mg/dL   GFR, Estimated >39 >39 mL/min   Anion gap 12 5 - 15  CBC per protocol   Collection Time: 04/16/24 12:48 PM  Result Value Ref Range   WBC 6.2 4.0 - 10.5 K/uL   RBC 3.82 (L) 3.87 - 5.11 MIL/uL   Hemoglobin 11.8 (L) 12.0 - 15.0 g/dL   HCT 63.4 63.9 - 53.9 %   MCV 95.5 80.0 - 100.0 fL   MCH 30.9 26.0 - 34.0 pg   MCHC 32.3 30.0 - 36.0 g/dL   RDW 86.8 88.4 - 84.4 %   Platelets 184 150 - 400  K/uL   nRBC 0.0 0.0 - 0.2 %  Basic metabolic panel   Collection Time: 04/17/24  6:26 AM  Result Value Ref Range   Sodium 135 135 - 145 mmol/L   Potassium 3.9 3.5 - 5.1 mmol/L   Chloride 102 98 - 111 mmol/L   CO2 20 (L) 22 - 32 mmol/L   Glucose, Bld 121 (H) 70 - 99 mg/dL   BUN 19 8 - 23 mg/dL   Creatinine, Ser 9.27 0.44 - 1.00 mg/dL   Calcium 7.5 (L) 8.9 - 10.3 mg/dL   GFR, Estimated >39 >39 mL/min   Anion gap 13 5 - 15  CBC   Collection Time: 04/17/24  6:26 AM  Result Value Ref Range   WBC 9.0 4.0 - 10.5 K/uL   RBC 3.68 (L) 3.87 - 5.11 MIL/uL   Hemoglobin 11.2 (L) 12.0 - 15.0 g/dL   HCT 65.3 (L) 63.9 - 53.9 %   MCV 94.0 80.0 - 100.0 fL   MCH 30.4 26.0 - 34.0 pg   MCHC 32.4 30.0 - 36.0 g/dL   RDW 87.1 88.4 - 84.4 %   Platelets 170 150 - 400 K/uL   nRBC 0.0 0.0 - 0.2 %  Basic metabolic panel   Collection Time: 04/18/24  8:03 AM  Result Value Ref Range   Sodium 135 135 - 145 mmol/L   Potassium 3.3 (L) 3.5 - 5.1 mmol/L   Chloride 101 98  - 111 mmol/L   CO2 26 22 - 32 mmol/L   Glucose, Bld 112 (H) 70 - 99 mg/dL   BUN 18 8 - 23 mg/dL   Creatinine, Ser 9.21 0.44 - 1.00 mg/dL   Calcium 8.0 (L) 8.9 - 10.3 mg/dL   GFR, Estimated >39 >39 mL/min   Anion gap 8 5 - 15  CBC   Collection Time: 04/18/24  8:03 AM  Result Value Ref Range   WBC 10.9 (H) 4.0 - 10.5 K/uL   RBC 3.63 (L) 3.87 - 5.11 MIL/uL   Hemoglobin 11.2 (L) 12.0 - 15.0 g/dL   HCT 65.8 (L) 63.9 - 53.9 %   MCV 93.9 80.0 - 100.0 fL   MCH 30.9 26.0 - 34.0 pg   MCHC 32.8 30.0 - 36.0 g/dL   RDW 86.8 88.4 - 84.4 %   Platelets 242 150 - 400 K/uL   nRBC 0.0 0.0 - 0.2 %    Discharge Medications:   Allergies as of 04/22/2024       Reactions   Alendronate Sodium Other (See Comments)   Made the hips/legs hurt   Amlodipine  Other (See Comments)   Feet swelling and hot flashes   Aspirin Other (See Comments)   Scleral hemorrhages   Cozaar  [losartan ] Other (See Comments)   Face broke out   Exelon [rivastigmine] Other (See Comments)   Just felt badly when taking it   Fexofenadine Other (See Comments)   Made her aware of heart beating   Hydrochlorothiazide  Other (See Comments)   Felt it contributed to hair falling out 7/12   Ibuprofen Nausea And Vomiting, Other (See Comments)   Vertigo, also   Ibuprofen Nausea And Vomiting, Other (See Comments)   Also, Dizziness, GI Intolerance   Lisinopril Other (See Comments)   Hair was falling out on it   Loratadine Other (See Comments)   Unknown   Metronidazole Diarrhea, Nausea And Vomiting, Other (See Comments)   GI Intolerance   Olmesartan Other (See Comments)   I wonder if it contributed to her  enteropathy in 2014. so will never give this again.   Ondansetron  Nausea And Vomiting, Other (See Comments)   GI Intolerance   Pantoprazole  Sodium Other (See Comments)   Bloating   Simvastatin Nausea And Vomiting, Other (See Comments)   Stomach upset        Medication List     TAKE these medications    amLODipine  5  MG tablet Commonly known as: NORVASC  Take 5 mg by mouth daily.   budesonide  3 MG 24 hr capsule Commonly known as: ENTOCORT EC  Take 3-6 mg by mouth See admin instructions. Take 3 mg by mouth once a day, ALTERNATING WITH 6 mg every other day   cyanocobalamin  1000 MCG tablet Commonly known as: VITAMIN B12 Take 1,000 mcg by mouth daily.   donepezil  5 MG tablet Commonly known as: ARICEPT  Take 5 mg by mouth daily.   escitalopram  10 MG tablet Commonly known as: LEXAPRO  Take 10 mg by mouth daily.   furosemide  20 MG tablet Commonly known as: LASIX  Take 20 mg by mouth in the morning.   hydrALAZINE  25 MG tablet Commonly known as: APRESOLINE  Take 1 tablet (25 mg total) by mouth 3 (three) times daily.   losartan  100 MG tablet Commonly known as: COZAAR  Take 100 mg by mouth daily.   metoprolol  succinate 25 MG 24 hr tablet Commonly known as: TOPROL -XL Take 1 tablet (25 mg total) by mouth daily.   pantoprazole  40 MG tablet Commonly known as: PROTONIX  Take 40 mg by mouth daily before breakfast.   potassium chloride  10 MEQ tablet Commonly known as: KLOR-CON  Take 10 mEq by mouth See admin instructions. Take 10 mEq by mouth in the morning and afternoon   Vitamin D3 125 MCG (5000 UT) Tabs Take 5,000 Units by mouth daily.        Diagnostic Studies: No results found.  Disposition: Discharge disposition: 01-Home or Self Care       Discharge Instructions     Call MD / Call 911   Complete by: As directed    If you experience chest pain or shortness of breath, CALL 911 and be transported to the hospital emergency room.  If you develope a fever above 101 F, pus (white drainage) or increased drainage or redness at the wound, or calf pain, call your surgeon's office.   Call MD / Call 911   Complete by: As directed    If you experience chest pain or shortness of breath, CALL 911 and be transported to the hospital emergency room.  If you develope a fever above 101 F, pus (white  drainage) or increased drainage or redness at the wound, or calf pain, call your surgeon's office.   Constipation Prevention   Complete by: As directed    Drink plenty of fluids.  Prune juice may be helpful.  You may use a stool softener, such as Colace (over the counter) 100 mg twice a day.  Use MiraLax  (over the counter) for constipation as needed.   Constipation Prevention   Complete by: As directed    Drink plenty of fluids.  Prune juice may be helpful.  You may use a stool softener, such as Colace (over the counter) 100 mg twice a day.  Use MiraLax  (over the counter) for constipation as needed.   Diet - low sodium heart healthy   Complete by: As directed    Diet - low sodium heart healthy   Complete by: As directed    Increase activity slowly as tolerated  Complete by: As directed    Increase activity slowly as tolerated   Complete by: As directed    Post-operative opioid taper instructions:   Complete by: As directed    POST-OPERATIVE OPIOID TAPER INSTRUCTIONS: It is important to wean off of your opioid medication as soon as possible. If you do not need pain medication after your surgery it is ok to stop day one. Opioids include: Codeine, Hydrocodone (Norco, Vicodin), Oxycodone (Percocet, oxycontin ) and hydromorphone amongst others.  Long term and even short term use of opiods can cause: Increased pain response Dependence Constipation Depression Respiratory depression And more.  Withdrawal symptoms can include Flu like symptoms Nausea, vomiting And more Techniques to manage these symptoms Hydrate well Eat regular healthy meals Stay active Use relaxation techniques(deep breathing, meditating, yoga) Do Not substitute Alcohol  to help with tapering If you have been on opioids for less than two weeks and do not have pain than it is ok to stop all together.  Plan to wean off of opioids This plan should start within one week post op of your joint replacement. Maintain the same  interval or time between taking each dose and first decrease the dose.  Cut the total daily intake of opioids by one tablet each day Next start to increase the time between doses. The last dose that should be eliminated is the evening dose.      Post-operative opioid taper instructions:   Complete by: As directed    POST-OPERATIVE OPIOID TAPER INSTRUCTIONS: It is important to wean off of your opioid medication as soon as possible. If you do not need pain medication after your surgery it is ok to stop day one. Opioids include: Codeine, Hydrocodone (Norco, Vicodin), Oxycodone (Percocet, oxycontin ) and hydromorphone amongst others.  Long term and even short term use of opiods can cause: Increased pain response Dependence Constipation Depression Respiratory depression And more.  Withdrawal symptoms can include Flu like symptoms Nausea, vomiting And more Techniques to manage these symptoms Hydrate well Eat regular healthy meals Stay active Use relaxation techniques(deep breathing, meditating, yoga) Do Not substitute Alcohol  to help with tapering If you have been on opioids for less than two weeks and do not have pain than it is ok to stop all together.  Plan to wean off of opioids This plan should start within one week post op of your joint replacement. Maintain the same interval or time between taking each dose and first decrease the dose.  Cut the total daily intake of opioids by one tablet each day Next start to increase the time between doses. The last dose that should be eliminated is the evening dose.             Signed: Jerona [name] Sage 04/22/2024, 7:34 AM

## 2024-04-22 NOTE — Progress Notes (Signed)
 Physical Therapy Treatment Patient Details Name: Bailey [name] MRN: [id number] DOB: [birth year] Today's Date: 04/22/2024   History of Present Illness Bailey [name] is a 83 yo female who presented 6/25 for planned sx after a fall related injury that occurred 03/13/24. 6/25 debridement large hematoma right leg with local tissue transfer and reinforcement with biologic tissue graft. PMH of HTN, bicuspid aortic valve with moderate AI, migraine, dementia, GERD, arthritis, anxiety.    PT Comments  Pt received in bed, alert, oriented only to self, asking over and over to go home. Pt mobilizing in and out of bed and transferring with supervision. Ambulated 300' with RW and CGA for the most part, occasional min A needed with changes in direction and when pt wasn't attending to task/ environment. Family has decided to take pt home and so we are now recommending HHPT and RW. PT will continue to follow.    If plan is discharge home, recommend the following: A little help with bathing/dressing/bathroom;A little help with walking and/or transfers;Assistance with cooking/housework;Direct supervision/assist for medications management;Assist for transportation;Help with stairs or ramp for entrance;Supervision due to cognitive status   Can travel by private vehicle     Yes  Equipment Recommendations  Rolling walker (2 wheels)    Recommendations for Other Services       Precautions / Restrictions Precautions Precautions: Fall Recall of Precautions/Restrictions: Impaired Precaution/Restrictions Comments: wrist restraints Restrictions Weight Bearing Restrictions Per Provider Order: Yes RLE Weight Bearing Per Provider Order: Weight bearing as tolerated     Mobility  Bed Mobility Overal bed mobility: Needs Assistance Bed Mobility: Supine to Sit, Sit to Supine     Supine to sit: Supervision Sit to supine: Supervision   General bed mobility comments: pt able to get out of and back into bed without  physical assist. Moving RLE well in bed.    Transfers Overall transfer level: Needs assistance Equipment used: Rolling walker (2 wheels) Transfers: Sit to/from Stand Sit to Stand: Contact guard assist           General transfer comment: CGA and verbal cueing for safety from bed and toilet    Ambulation/Gait Ambulation/Gait assistance: Contact guard assist, Min assist Gait Distance (Feet): 300 Feet Assistive device: Rolling walker (2 wheels) Gait Pattern/deviations: Step-through pattern, Decreased stride length Gait velocity: decr Gait velocity interpretation: 1.31 - 2.62 ft/sec, indicative of limited community ambulator   General Gait Details: CGA for gait most of the time, occasional min A with changes in direction and when pt became distracted.   Stairs             Wheelchair Mobility     Tilt Bed    Modified Rankin (Stroke Patients Only)       Balance Overall balance assessment: Needs assistance Sitting-balance support: No upper extremity supported, Feet supported Sitting balance-Leahy Scale: Good     Standing balance support: Single extremity supported, During functional activity Standing balance-Leahy Scale: Poor Standing balance comment: able to stand unsupported but poor safety awareness needing CGA. Also decreased attention                            Communication Communication Communication: No apparent difficulties  Cognition Arousal: Alert Behavior During Therapy: Flat affect   PT - Cognitive impairments: History of cognitive impairments                       PT - Cognition Comments: pt  not oriented to place or situation. Very fixated on wanting to go home. Alert and following commands throughout session though Following commands: Impaired Following commands impaired: Follows one step commands with increased time    Cueing Cueing Techniques: Verbal cues, Gestural cues  Exercises      General Comments General  comments (skin integrity, edema, etc.): VSS. Pt needed assist with perineal care in bathroom      Pertinent Vitals/Pain Pain Assessment Pain Assessment: Faces Faces Pain Scale: Hurts a little bit Pain Location: Rt leg Pain Descriptors / Indicators: Discomfort, Sore Pain Intervention(s): Monitored during session    Home Living                          Prior Function            PT Goals (current goals can now be found in the care plan section) Acute Rehab PT Goals Patient Stated Goal: none stated PT Goal Formulation: Patient unable to participate in goal setting Time For Goal Achievement: 05/01/24 Potential to Achieve Goals: Fair Progress towards PT goals: Progressing toward goals    Frequency    Min 2X/week      PT Plan      Co-evaluation              AM-PAC PT 6 Clicks Mobility   Outcome Measure  Help needed turning from your back to your side while in a flat bed without using bedrails?: None Help needed moving from lying on your back to sitting on the side of a flat bed without using bedrails?: None Help needed moving to and from a bed to a chair (including a wheelchair)?: A Little Help needed standing up from a chair using your arms (e.g., wheelchair or bedside chair)?: A Little Help needed to walk in hospital room?: A Little Help needed climbing 3-5 steps with a railing? : A Little 6 Click Score: 20    End of Session Equipment Utilized During Treatment: Gait belt Activity Tolerance: Patient tolerated treatment well Patient left: in bed;with bed alarm set;with call bell/phone within reach;with restraints reapplied (wrist) Nurse Communication: Mobility status PT Visit Diagnosis: Muscle weakness (generalized) (M62.81);Difficulty in walking, not elsewhere classified (R26.2)     Time: 8866-8841 PT Time Calculation (min) (ACUTE ONLY): 25 min  Charges:    $Gait Training: 23-37 mins PT General Charges $$ ACUTE PT VISIT: 1 Visit                      Richerd Lipoma, PT  Acute Rehab Services Secure chat preferred Office [phone]    Richerd L Tiwanda Threats 04/22/2024, 1:30 PM

## 2024-04-22 NOTE — Plan of Care (Signed)
  Problem: Education: Goal: Knowledge of General Education information will improve Description: Including pain rating scale, medication(s)/side effects and non-pharmacologic comfort measures Outcome: Progressing   Problem: Health Behavior/Discharge Planning: Goal: Ability to manage health-related needs will improve Outcome: Progressing   Problem: Clinical Measurements: Goal: Ability to maintain clinical measurements within normal limits will improve Outcome: Progressing Goal: Will remain free from infection Outcome: Progressing Goal: Diagnostic test results will improve Outcome: Progressing Goal: Respiratory complications will improve Outcome: Progressing Goal: Cardiovascular complication will be avoided Outcome: Progressing   Problem: Activity: Goal: Risk for activity intolerance will decrease Outcome: Progressing   Problem: Nutrition: Goal: Adequate nutrition will be maintained Outcome: Progressing   Problem: Coping: Goal: Level of anxiety will decrease Outcome: Progressing   Problem: Elimination: Goal: Will not experience complications related to bowel motility Outcome: Progressing Goal: Will not experience complications related to urinary retention Outcome: Progressing   Problem: Pain Managment: Goal: General experience of comfort will improve and/or be controlled Outcome: Progressing   Problem: Safety: Goal: Ability to remain free from injury will improve Outcome: Progressing   Problem: Skin Integrity: Goal: Risk for impaired skin integrity will decrease Outcome: Progressing   Problem: Education: Goal: Knowledge of the prescribed therapeutic regimen will improve Outcome: Progressing Goal: Ability to verbalize activity precautions or restrictions will improve Outcome: Progressing Goal: Understanding of discharge needs will improve Outcome: Progressing   Problem: Activity: Goal: Ability to perform//tolerate increased activity and mobilize with assistive  devices will improve Outcome: Progressing   Problem: Clinical Measurements: Goal: Postoperative complications will be avoided or minimized Outcome: Progressing   Problem: Self-Care: Goal: Ability to meet self-care needs will improve Outcome: Progressing   Problem: Self-Concept: Goal: Ability to maintain and perform role responsibilities to the fullest extent possible will improve Outcome: Progressing   Problem: Pain Management: Goal: Pain level will decrease with appropriate interventions Outcome: Progressing   Problem: Skin Integrity: Goal: Demonstration of wound healing without infection will improve Outcome: Progressing   Problem: Safety: Goal: Non-violent Restraint(s) Outcome: Progressing

## 2024-04-22 NOTE — Progress Notes (Signed)
 Pt discharged to home son via wheelchair with all belongings, paperwork, and walker. Son declined need for 3 in 1

## 2024-04-22 NOTE — TOC Progression Note (Addendum)
 Transition of Care [facility]) - Progression Note    Patient Details  Name: Bailey [name] MRN: [id number] Date of Birth: November 08, 1940  [facility] CM/SW Contact  Yazan Gatling SHAUNNA [place], [place] Phone Number: 04/22/2024, 9:22 AM  Clinical Narrative:     CSW received voicemail from pt's daughter requesting CSW contact her brother Bennet as she will be unavailable.   CSW called [facility] and explained SNF bed offers(texted him offers and medicare star ratings after call). Discussed HH option and recommendation for 24/7 supervision which would require family to arrange overnight supervision for pt. Bennet explains he will review and discuss options with family. CSW explained choice would be needed today as pt is medically ready.   1120: Received text from [facility] stating that family would like to take pt home today. CSW updated RNCM.   Expected Discharge Plan: Skilled Nursing Facility Barriers to Discharge: SNF Pending bed offer, Insurance Authorization  Expected Discharge Plan and Services In-house Referral: Clinical Social Work     Living arrangements for the past 2 months: Single Family Home Expected Discharge Date: 04/22/24                                     Social Determinants of Health (SDOH) Interventions SDOH Screenings   Food Insecurity: No Food Insecurity (04/17/2024)  Housing: Low Risk  (04/17/2024)  Transportation Needs: No Transportation Needs (04/17/2024)  Utilities: Not At Risk (04/17/2024)  Depression (PHQ2-9): Low Risk  (10/10/2018)  Social Connections: Patient Unable To Answer (04/21/2024)  Tobacco Use: Low Risk  (04/16/2024)    Readmission Risk Interventions     No data to display

## 2024-04-22 NOTE — TOC Initial Note (Addendum)
 Transition of Care (TOC) - Initial/Assessment Note   Spoke to patient's son Arvella Massingale [phone]   Family have decided to take [place] home at discharge today with home helath services. NCM offered choice , [place] had no preference. NCM called [facility] ( they have highest rating on medicare.gov list for patient's zip code). Arna accepted referral for HHRN,PT,OT,aide and SW. Information on AVS.   PT/OT recommending 3 in1 and walker. Bennet stated she has neither one. NCM will order and DME will be delivered to bedside today prior to discharge. Bennet will provide transportation home. Updated team. Dr Harden signed HH orders and DME orders   Son decided on only the rolling walker. Once he saw 3 in1 realized they had one at home already per nurse  Patient Details  Name: Bailey [name] MRN: [id number] Date of Birth: 1941/09/01  Transition of Care [facility]) CM/SW Contact:    Stephane Powell Jansky, RN Phone Number: 04/22/2024, 12:11 PM  Clinical Narrative:                   Expected Discharge Plan: Home w Home Health Services Barriers to Discharge: No Barriers Identified   Patient Goals and CMS Choice Patient states their goals for this hospitalization and ongoing recovery are:: to go home CMS Medicare.gov Compare Post Acute Care list provided to:: Patient Represenative (must comment) (son Tomara Youngberg [phone]) Choice offered to / list presented to : Adult Children      Expected Discharge Plan and Services In-house Referral: Clinical Social Work Discharge Planning Services: CM Consult Post Acute Care Choice: Home Health, Durable Medical Equipment Living arrangements for the past 2 months: Single Family Home Expected Discharge Date: 04/22/24               DME Arranged: 3-N-1, Vannie rolling DME Agency: [employer] Date DME Agency Contacted: 04/22/24 Time DME Agency Contacted: 1210 Representative spoke with at DME Agency: [place] HH Arranged: RN, PT, OT, Nurse's Aide,  Social Work [employer] Agency: [facility] Date HH Agency Contacted: 04/22/24 Time HH Agency Contacted: 1210 Representative spoke with at [facility] Agency: Arna  Prior Living Arrangements/Services Living arrangements for the past 2 months: Single Family Home Lives with:: Spouse Patient language and need for interpreter reviewed:: Yes Do you feel safe going back to the place where you live?: Yes      Need for Family Participation in Patient Care: Yes (Comment) Care giver support system in place?: Yes (comment)   Criminal Activity/Legal Involvement Pertinent to Current Situation/Hospitalization: No - Comment as needed  Activities of Daily Living   ADL Screening (condition at time of admission) Independently performs ADLs?: No Does the patient have a NEW difficulty with bathing/dressing/toileting/self-feeding that is expected to last >3 days?: Yes (Initiates electronic notice to provider for possible OT consult) Does the patient have a NEW difficulty with getting in/out of bed, walking, or climbing stairs that is expected to last >3 days?: No Does the patient have a NEW difficulty with communication that is expected to last >3 days?: No Is the patient deaf or have difficulty hearing?: No Does the patient have difficulty seeing, even when wearing glasses/contacts?: No Does the patient have difficulty concentrating, remembering, or making decisions?: No  Permission Sought/Granted Permission sought to share information with : Facility Medical sales representative, Family Supports Permission granted to share information with : Yes, Verbal Permission Granted  Share Information with NAME: son Bennet  Permission granted to share info w AGENCY: [place] [facility]  [employer]  Permission granted to share info w Relationship: Spouse  Permission granted to share info w Contact Information: [phone]  Emotional Assessment Appearance:: Appears stated age Attitude/Demeanor/Rapport: Unable to Assess Affect (typically  observed): Unable to Assess Orientation: : Oriented to Self Alcohol  / Substance Use: Not Applicable Psych Involvement: No (comment)  Admission diagnosis:  Ulcer of right lower extremity, unspecified ulcer stage (HCC) [L97.919] Traumatic open wound of right lower leg [S81.801A] Leg wound, right, initial encounter [S81.801A] Patient Active Problem List   Diagnosis Date Noted   Traumatic open wound of right lower leg 04/16/2024   Hematoma of right lower leg 04/16/2024   Leg wound, right, initial encounter 04/16/2024   Closed right scapular fracture 10/12/2023   Compression fracture of body of thoracic vertebra (HCC) 10/12/2023   Fall with injury 10/11/2023   Aortic insufficiency 09/06/2017   Peripheral neuropathy 08/15/2017   Tinnitus 05/18/2014   Pleomorphic adenoma of minor salivary gland 01/13/2014   Diarrhea 01/06/2013   Nausea with vomiting 01/06/2013   Abdominal pain, right upper quadrant 01/06/2013   Palpitations 01/06/2013   Bright red blood per rectum 01/06/2013   Dehydration 01/06/2013   Hypokalemia 01/06/2013   Hyponatremia 01/06/2013   Acute renal failure (HCC) 01/06/2013   Migraine 03/14/2012   Dizziness 03/14/2012   Bicuspid aortic valve 02/27/2012   HTN (hypertension) 02/27/2012   Hyperlipidemia 02/27/2012   PCP:  Shayne Anes, MD Pharmacy:   [id number] [address] ([address]), [place] - [address] [place] ([place]) [place] [address] Phone: [phone] Fax: [phone]     Social Drivers of Health (SDOH) Social History: SDOH Screenings   Food Insecurity: No Food Insecurity (04/17/2024)  Housing: Low Risk  (04/17/2024)  Transportation Needs: No Transportation Needs (04/17/2024)  Utilities: Not At Risk (04/17/2024)  Depression (PHQ2-9): Low Risk  (10/10/2018)  Social Connections: Patient Unable To Answer (04/21/2024)  Tobacco Use: Low Risk  (04/16/2024)   SDOH Interventions:     Readmission Risk Interventions     No data to  display

## 2024-04-22 NOTE — Progress Notes (Signed)
 Mobility Specialist Progress Note:    04/22/24 1036  Mobility  Activity Transferred from chair to bed  Level of Assistance Contact guard assist, steadying assist  Assistive Device Front wheel walker  Distance Ambulated (ft) 5 ft  RLE Weight Bearing Per Provider Order WBAT  Activity Response Tolerated well  Mobility Referral Yes  Mobility visit 1 Mobility  Mobility Specialist Start Time (ACUTE ONLY) 1014  Mobility Specialist Stop Time (ACUTE ONLY) 1026  Mobility Specialist Time Calculation (min) (ACUTE ONLY) 12 min   Received pt in chair and agreeable to mobility. Pt attempted ambulating but d/t pain in RLE pt requested to get back to bed. Pt left in bed with wrist restraints and bed alarm on. Personal belongings and call light within reach. All needs met.   Lavanda Pollack Mobility Specialist  Please contact via [employer] or  Rehab Office [phone]

## 2024-04-24 DIAGNOSIS — F0393 Unspecified dementia, unspecified severity, with mood disturbance: Secondary | ICD-10-CM | POA: Diagnosis not present

## 2024-04-24 DIAGNOSIS — Z556 Problems related to health literacy: Secondary | ICD-10-CM | POA: Diagnosis not present

## 2024-04-24 DIAGNOSIS — K529 Noninfective gastroenteritis and colitis, unspecified: Secondary | ICD-10-CM | POA: Diagnosis not present

## 2024-04-24 DIAGNOSIS — M19041 Primary osteoarthritis, right hand: Secondary | ICD-10-CM | POA: Diagnosis not present

## 2024-04-24 DIAGNOSIS — K219 Gastro-esophageal reflux disease without esophagitis: Secondary | ICD-10-CM | POA: Diagnosis not present

## 2024-04-24 DIAGNOSIS — Z9181 History of falling: Secondary | ICD-10-CM | POA: Diagnosis not present

## 2024-04-24 DIAGNOSIS — M19042 Primary osteoarthritis, left hand: Secondary | ICD-10-CM | POA: Diagnosis not present

## 2024-04-24 DIAGNOSIS — E785 Hyperlipidemia, unspecified: Secondary | ICD-10-CM | POA: Diagnosis not present

## 2024-04-24 DIAGNOSIS — I351 Nonrheumatic aortic (valve) insufficiency: Secondary | ICD-10-CM | POA: Diagnosis not present

## 2024-04-24 DIAGNOSIS — E876 Hypokalemia: Secondary | ICD-10-CM | POA: Diagnosis not present

## 2024-04-24 DIAGNOSIS — E871 Hypo-osmolality and hyponatremia: Secondary | ICD-10-CM | POA: Diagnosis not present

## 2024-04-24 DIAGNOSIS — F0394 Unspecified dementia, unspecified severity, with anxiety: Secondary | ICD-10-CM | POA: Diagnosis not present

## 2024-04-24 DIAGNOSIS — I10 Essential (primary) hypertension: Secondary | ICD-10-CM | POA: Diagnosis not present

## 2024-04-24 DIAGNOSIS — H269 Unspecified cataract: Secondary | ICD-10-CM | POA: Diagnosis not present

## 2024-04-24 DIAGNOSIS — H9190 Unspecified hearing loss, unspecified ear: Secondary | ICD-10-CM | POA: Diagnosis not present

## 2024-04-24 DIAGNOSIS — Z860100 Personal history of colon polyps, unspecified: Secondary | ICD-10-CM | POA: Diagnosis not present

## 2024-04-24 DIAGNOSIS — S81801D Unspecified open wound, right lower leg, subsequent encounter: Secondary | ICD-10-CM | POA: Diagnosis not present

## 2024-04-24 DIAGNOSIS — Z8701 Personal history of pneumonia (recurrent): Secondary | ICD-10-CM | POA: Diagnosis not present

## 2024-04-24 DIAGNOSIS — G43909 Migraine, unspecified, not intractable, without status migrainosus: Secondary | ICD-10-CM | POA: Diagnosis not present

## 2024-04-28 ENCOUNTER — Ambulatory Visit: Admitting: Orthopedic Surgery

## 2024-04-28 ENCOUNTER — Encounter: Payer: Self-pay | Admitting: Orthopedic Surgery

## 2024-04-28 DIAGNOSIS — L97911 Non-pressure chronic ulcer of unspecified part of right lower leg limited to breakdown of skin: Secondary | ICD-10-CM

## 2024-04-28 NOTE — Progress Notes (Signed)
 Office Visit Note   Patient: Bailey [name]           Date of Birth: February 07, 1941           MRN: [id number] Visit Date: 04/28/2024              Requested by: Shayne Anes, MD [address] [place],  [place] [address] PCP: Shayne Anes, MD  Chief Complaint  Patient presents with   Right Leg - Routine Post Op    04/16/24 I&D RLE abscess      HPI: Patient is is a 83 year old woman who is seen with her daughter.  Patient underwent debridement of a large hematoma right lower extremity on 04/16/2024.  Patient presents with increased swelling and bruising.  Assessment & Plan: Visit Diagnoses:  1. Ulcer of right leg, limited to breakdown of skin (HCC)     Plan: Instructions were provided for Dial soap cleansing in the shower.  Apply Vashe plus 4 x 4 plus an Ace wrap.  Leg is to be elevated at all times above the heart and patient may undergo purposeful ambulation.  Follow-Up Instructions: Return in about 2 weeks (around 05/12/2024).   Ortho Exam  Patient is alert, oriented, no adenopathy, well-dressed, normal affect, normal respiratory effort. Examination there is increased swelling increased ecchymosis and superficial eschar along the surgical incision from swelling.  She has a good dorsalis pedis pulse no arterial insufficiency.    Imaging: No results found. No images are attached to the encounter.  Labs: Lab Results  Component Value Date   HGBA1C 5.1 08/15/2017   REPTSTATUS 01/10/2013 FINAL 01/06/2013   CULT  01/06/2013    NO SALMONELLA, SHIGELLA, CAMPYLOBACTER, YERSINIA, OR E.COLI 0157:H7 ISOLATED   LABORGA NO GROWTH 08/03/2015     Lab Results  Component Value Date   ALBUMIN 3.8 06/13/2021   ALBUMIN 2.8 (L) 01/08/2013   ALBUMIN 3.5 01/06/2013    No results found for: MG No results found for: VD25OH  No results found for: PREALBUMIN    Latest Ref Rng & Units 04/18/2024    8:03 AM 04/17/2024    6:26 AM 04/16/2024   12:48 PM  CBC EXTENDED  WBC 4.0 - 10.5  K/uL 10.9  9.0  6.2   RBC 3.87 - 5.11 MIL/uL 3.63  3.68  3.82   Hemoglobin 12.0 - 15.0 g/dL 88.7  88.7  88.1   HCT 36.0 - 46.0 % 34.1  34.6  36.5   Platelets 150 - 400 K/uL 242  170  184      There is no height or weight on file to calculate BMI.  Orders:  No orders of the defined types were placed in this encounter.  No orders of the defined types were placed in this encounter.    Procedures: No procedures performed  Clinical Data: No additional findings.  ROS:  All other systems negative, except as noted in the HPI. Review of Systems  Objective: Vital Signs: LMP 06/23/2010 (Approximate)   Specialty Comments:  No specialty comments available.  PMFS History: Patient Active Problem List   Diagnosis Date Noted   Traumatic open wound of right lower leg 04/16/2024   Hematoma of right lower leg 04/16/2024   Leg wound, right, initial encounter 04/16/2024   Closed right scapular fracture 10/12/2023   Compression fracture of body of thoracic vertebra (HCC) 10/12/2023   Fall with injury 10/11/2023   Aortic insufficiency 09/06/2017   Peripheral neuropathy 08/15/2017   Tinnitus 05/18/2014   Pleomorphic  adenoma of minor salivary gland 01/13/2014   Diarrhea 01/06/2013   Nausea with vomiting 01/06/2013   Abdominal pain, right upper quadrant 01/06/2013   Palpitations 01/06/2013   Bright red blood per rectum 01/06/2013   Dehydration 01/06/2013   Hypokalemia 01/06/2013   Hyponatremia 01/06/2013   Acute renal failure (HCC) 01/06/2013   Migraine 03/14/2012   Dizziness 03/14/2012   Bicuspid aortic valve 02/27/2012   HTN (hypertension) 02/27/2012   Hyperlipidemia 02/27/2012   Past Medical History:  Diagnosis Date   Anxiety    Aortic insufficiency    Arthritis    hands (01/06/2013)   Basal cell carcinoma of skin    Bicuspid aortic valve    Cataract    right   Colitis, collagenous summer of 2013   Colon polyp    DDD (degenerative disc disease) 12/2009   Dementia  (HCC)    GERD (gastroesophageal reflux disease)    /endoscopy; doesn't bother me at all (01/06/2013)   Hard of hearing    bilateral - no hearing aids   Heart murmur    Hyperlipidemia    Hypertension    Left facial numbness    previous, not current   Migraine    used to have once/month; none in years (01/06/2013)   Palpitations    Pneumonia ~ 1944; ~ 2009   Tinnitus    Vertigo    hx    Family History  Problem Relation Age of Onset   Bone cancer Father    Hypertension Father    Thyroid  disease Father        hypo   Brain cancer Brother    Arrhythmia Mother    Osteoporosis Mother    Cancer Mother        colon   Diabetes Mother    Hypertension Mother     Past Surgical History:  Procedure Laterality Date   CATARACT EXTRACTION EXTRACAPSULAR Bilateral 2015   COLONOSCOPY N/A 01/07/2013   Procedure: COLONOSCOPY;  Surgeon: Lamar LULLA Bunk, MD;  Location[facility] END[facility]COPY;  Service: Endoscopy;  Laterality: N/A;  The colonoscopy will be unprepped   DILATION AND CURETTAGE OF UTERUS  2002   w/hysteroscopy   ESOPHAGOGASTRODUODENOSCOPY N/A 01/07/2013   Procedure: ESOPHAGOGASTRODUODENOSCOPY (EGD);  Surgeon: Lamar LULLA Bunk, MD;  Location: [facility] ENDOSCOPY;  Service: Endoscopy;  Laterality: N/A;   HYSTEROSCOPY  2002   D&C(sub fibroid)    HYSTEROSCOPY  2006   resection polyps D&C   INCISION AND DRAINAGE OF DEEP ABSCESS, CALF Right 04/16/2024   Procedure: INCISION AND DRAINAGE OF DEEP ABSCESS,  RIGHT LOWER EXTREMITY;  Surgeon: Harden Jerona LULLA, MD;  Location: [facility] OR;  Service: Orthopedics;  Laterality: Right;  RIGHT LEG DEBRIDEMENT   THYROIDECTOMY, PARTIAL     removed gland from left side of neck that was to go down and past thyroid  in front (01/06/2013)   TONSILLECTOMY     when I was little (01/06/2013)   TUBAL LIGATION Bilateral 1970's   TUMOR REMOVAL  1990's & 06/23/2012, 02/2014   benign; roof of mouth (01/06/2013)   Social History   Occupational History   Not on file  Tobacco Use    Smoking status: Never   Smokeless tobacco: Never  Vaping Use   Vaping status: Never Used  Substance and Sexual Activity   Alcohol  use: No    Alcohol /week: 0.0 standard drinks of alcohol    Drug use: No   Sexual activity: Not Currently    Partners: Male    Birth control/protection: Post-menopausal

## 2024-04-29 DIAGNOSIS — I10 Essential (primary) hypertension: Secondary | ICD-10-CM | POA: Diagnosis not present

## 2024-04-29 DIAGNOSIS — M810 Age-related osteoporosis without current pathological fracture: Secondary | ICD-10-CM | POA: Diagnosis not present

## 2024-04-29 DIAGNOSIS — E785 Hyperlipidemia, unspecified: Secondary | ICD-10-CM | POA: Diagnosis not present

## 2024-04-29 DIAGNOSIS — D649 Anemia, unspecified: Secondary | ICD-10-CM | POA: Diagnosis not present

## 2024-04-29 DIAGNOSIS — R7301 Impaired fasting glucose: Secondary | ICD-10-CM | POA: Diagnosis not present

## 2024-04-29 DIAGNOSIS — Z0000 Encounter for general adult medical examination without abnormal findings: Secondary | ICD-10-CM | POA: Diagnosis not present

## 2024-04-29 DIAGNOSIS — E7849 Other hyperlipidemia: Secondary | ICD-10-CM | POA: Diagnosis not present

## 2024-04-30 DIAGNOSIS — K219 Gastro-esophageal reflux disease without esophagitis: Secondary | ICD-10-CM | POA: Diagnosis not present

## 2024-04-30 DIAGNOSIS — H269 Unspecified cataract: Secondary | ICD-10-CM | POA: Diagnosis not present

## 2024-04-30 DIAGNOSIS — Z860100 Personal history of colon polyps, unspecified: Secondary | ICD-10-CM | POA: Diagnosis not present

## 2024-04-30 DIAGNOSIS — E876 Hypokalemia: Secondary | ICD-10-CM | POA: Diagnosis not present

## 2024-04-30 DIAGNOSIS — E785 Hyperlipidemia, unspecified: Secondary | ICD-10-CM | POA: Diagnosis not present

## 2024-04-30 DIAGNOSIS — E871 Hypo-osmolality and hyponatremia: Secondary | ICD-10-CM | POA: Diagnosis not present

## 2024-04-30 DIAGNOSIS — I351 Nonrheumatic aortic (valve) insufficiency: Secondary | ICD-10-CM | POA: Diagnosis not present

## 2024-04-30 DIAGNOSIS — Z556 Problems related to health literacy: Secondary | ICD-10-CM | POA: Diagnosis not present

## 2024-04-30 DIAGNOSIS — G43909 Migraine, unspecified, not intractable, without status migrainosus: Secondary | ICD-10-CM | POA: Diagnosis not present

## 2024-04-30 DIAGNOSIS — H9190 Unspecified hearing loss, unspecified ear: Secondary | ICD-10-CM | POA: Diagnosis not present

## 2024-04-30 DIAGNOSIS — Z8701 Personal history of pneumonia (recurrent): Secondary | ICD-10-CM | POA: Diagnosis not present

## 2024-04-30 DIAGNOSIS — M19042 Primary osteoarthritis, left hand: Secondary | ICD-10-CM | POA: Diagnosis not present

## 2024-04-30 DIAGNOSIS — S81801D Unspecified open wound, right lower leg, subsequent encounter: Secondary | ICD-10-CM | POA: Diagnosis not present

## 2024-04-30 DIAGNOSIS — M19041 Primary osteoarthritis, right hand: Secondary | ICD-10-CM | POA: Diagnosis not present

## 2024-04-30 DIAGNOSIS — K529 Noninfective gastroenteritis and colitis, unspecified: Secondary | ICD-10-CM | POA: Diagnosis not present

## 2024-04-30 DIAGNOSIS — F0394 Unspecified dementia, unspecified severity, with anxiety: Secondary | ICD-10-CM | POA: Diagnosis not present

## 2024-04-30 DIAGNOSIS — Z9181 History of falling: Secondary | ICD-10-CM | POA: Diagnosis not present

## 2024-04-30 DIAGNOSIS — F0393 Unspecified dementia, unspecified severity, with mood disturbance: Secondary | ICD-10-CM | POA: Diagnosis not present

## 2024-04-30 DIAGNOSIS — I10 Essential (primary) hypertension: Secondary | ICD-10-CM | POA: Diagnosis not present

## 2024-05-01 DIAGNOSIS — I10 Essential (primary) hypertension: Secondary | ICD-10-CM | POA: Diagnosis not present

## 2024-05-01 DIAGNOSIS — M19041 Primary osteoarthritis, right hand: Secondary | ICD-10-CM | POA: Diagnosis not present

## 2024-05-01 DIAGNOSIS — F0393 Unspecified dementia, unspecified severity, with mood disturbance: Secondary | ICD-10-CM | POA: Diagnosis not present

## 2024-05-01 DIAGNOSIS — S81801D Unspecified open wound, right lower leg, subsequent encounter: Secondary | ICD-10-CM | POA: Diagnosis not present

## 2024-05-01 DIAGNOSIS — Z8701 Personal history of pneumonia (recurrent): Secondary | ICD-10-CM | POA: Diagnosis not present

## 2024-05-01 DIAGNOSIS — E871 Hypo-osmolality and hyponatremia: Secondary | ICD-10-CM | POA: Diagnosis not present

## 2024-05-01 DIAGNOSIS — Z9181 History of falling: Secondary | ICD-10-CM | POA: Diagnosis not present

## 2024-05-01 DIAGNOSIS — I351 Nonrheumatic aortic (valve) insufficiency: Secondary | ICD-10-CM | POA: Diagnosis not present

## 2024-05-01 DIAGNOSIS — H9190 Unspecified hearing loss, unspecified ear: Secondary | ICD-10-CM | POA: Diagnosis not present

## 2024-05-01 DIAGNOSIS — E876 Hypokalemia: Secondary | ICD-10-CM | POA: Diagnosis not present

## 2024-05-01 DIAGNOSIS — K219 Gastro-esophageal reflux disease without esophagitis: Secondary | ICD-10-CM | POA: Diagnosis not present

## 2024-05-01 DIAGNOSIS — K529 Noninfective gastroenteritis and colitis, unspecified: Secondary | ICD-10-CM | POA: Diagnosis not present

## 2024-05-01 DIAGNOSIS — E785 Hyperlipidemia, unspecified: Secondary | ICD-10-CM | POA: Diagnosis not present

## 2024-05-01 DIAGNOSIS — H269 Unspecified cataract: Secondary | ICD-10-CM | POA: Diagnosis not present

## 2024-05-01 DIAGNOSIS — G43909 Migraine, unspecified, not intractable, without status migrainosus: Secondary | ICD-10-CM | POA: Diagnosis not present

## 2024-05-01 DIAGNOSIS — F0394 Unspecified dementia, unspecified severity, with anxiety: Secondary | ICD-10-CM | POA: Diagnosis not present

## 2024-05-01 DIAGNOSIS — Z556 Problems related to health literacy: Secondary | ICD-10-CM | POA: Diagnosis not present

## 2024-05-01 DIAGNOSIS — Z860100 Personal history of colon polyps, unspecified: Secondary | ICD-10-CM | POA: Diagnosis not present

## 2024-05-01 DIAGNOSIS — M19042 Primary osteoarthritis, left hand: Secondary | ICD-10-CM | POA: Diagnosis not present

## 2024-05-02 ENCOUNTER — Telehealth (HOSPITAL_COMMUNITY): Payer: Self-pay

## 2024-05-02 NOTE — Telephone Encounter (Signed)
 Auth Submission: PENDING Site of care: Site of care: [facility] Payer: BCBS Medicare Medication & CPT/J Code(s) submitted: Prolia  (Denosumab ) [id number] Diagnosis Code: M81.0 Route of submission (phone, fax, portal): fax Phone # Fax # [phone] Auth type: Buy/Bill HB Units/visits requested: 60mg  x 2 doses

## 2024-05-05 DIAGNOSIS — S81801D Unspecified open wound, right lower leg, subsequent encounter: Secondary | ICD-10-CM | POA: Diagnosis not present

## 2024-05-05 NOTE — Telephone Encounter (Signed)
 Auth Submission: APPROVED Site of care: Site of care: [facility] Payer: BCBS Medicare Medication & CPT/J Code(s) submitted: Prolia  (Denosumab ) [id number] Diagnosis Code: M81.0 Route of submission (phone, fax, portal): fax Phone # Fax # [phone] Auth type: Buy/Bill HB Units/visits requested: 60mg  x 2 doses Reference number:  Approval from: 05/02/24 to 05/02/25

## 2024-05-06 DIAGNOSIS — Z860100 Personal history of colon polyps, unspecified: Secondary | ICD-10-CM | POA: Diagnosis not present

## 2024-05-06 DIAGNOSIS — E785 Hyperlipidemia, unspecified: Secondary | ICD-10-CM | POA: Diagnosis not present

## 2024-05-06 DIAGNOSIS — K219 Gastro-esophageal reflux disease without esophagitis: Secondary | ICD-10-CM | POA: Diagnosis not present

## 2024-05-06 DIAGNOSIS — E876 Hypokalemia: Secondary | ICD-10-CM | POA: Diagnosis not present

## 2024-05-06 DIAGNOSIS — I10 Essential (primary) hypertension: Secondary | ICD-10-CM | POA: Diagnosis not present

## 2024-05-06 DIAGNOSIS — Z8701 Personal history of pneumonia (recurrent): Secondary | ICD-10-CM | POA: Diagnosis not present

## 2024-05-06 DIAGNOSIS — M19042 Primary osteoarthritis, left hand: Secondary | ICD-10-CM | POA: Diagnosis not present

## 2024-05-06 DIAGNOSIS — Z9181 History of falling: Secondary | ICD-10-CM | POA: Diagnosis not present

## 2024-05-06 DIAGNOSIS — F0393 Unspecified dementia, unspecified severity, with mood disturbance: Secondary | ICD-10-CM | POA: Diagnosis not present

## 2024-05-06 DIAGNOSIS — Z556 Problems related to health literacy: Secondary | ICD-10-CM | POA: Diagnosis not present

## 2024-05-06 DIAGNOSIS — F0394 Unspecified dementia, unspecified severity, with anxiety: Secondary | ICD-10-CM | POA: Diagnosis not present

## 2024-05-06 DIAGNOSIS — S81801D Unspecified open wound, right lower leg, subsequent encounter: Secondary | ICD-10-CM | POA: Diagnosis not present

## 2024-05-06 DIAGNOSIS — H269 Unspecified cataract: Secondary | ICD-10-CM | POA: Diagnosis not present

## 2024-05-06 DIAGNOSIS — H9190 Unspecified hearing loss, unspecified ear: Secondary | ICD-10-CM | POA: Diagnosis not present

## 2024-05-06 DIAGNOSIS — M19041 Primary osteoarthritis, right hand: Secondary | ICD-10-CM | POA: Diagnosis not present

## 2024-05-06 DIAGNOSIS — K529 Noninfective gastroenteritis and colitis, unspecified: Secondary | ICD-10-CM | POA: Diagnosis not present

## 2024-05-06 DIAGNOSIS — G43909 Migraine, unspecified, not intractable, without status migrainosus: Secondary | ICD-10-CM | POA: Diagnosis not present

## 2024-05-06 DIAGNOSIS — I351 Nonrheumatic aortic (valve) insufficiency: Secondary | ICD-10-CM | POA: Diagnosis not present

## 2024-05-06 DIAGNOSIS — E871 Hypo-osmolality and hyponatremia: Secondary | ICD-10-CM | POA: Diagnosis not present

## 2024-05-07 DIAGNOSIS — Z9181 History of falling: Secondary | ICD-10-CM | POA: Diagnosis not present

## 2024-05-07 DIAGNOSIS — S81801D Unspecified open wound, right lower leg, subsequent encounter: Secondary | ICD-10-CM | POA: Diagnosis not present

## 2024-05-07 DIAGNOSIS — G43909 Migraine, unspecified, not intractable, without status migrainosus: Secondary | ICD-10-CM | POA: Diagnosis not present

## 2024-05-07 DIAGNOSIS — E871 Hypo-osmolality and hyponatremia: Secondary | ICD-10-CM | POA: Diagnosis not present

## 2024-05-07 DIAGNOSIS — F0394 Unspecified dementia, unspecified severity, with anxiety: Secondary | ICD-10-CM | POA: Diagnosis not present

## 2024-05-07 DIAGNOSIS — E876 Hypokalemia: Secondary | ICD-10-CM | POA: Diagnosis not present

## 2024-05-07 DIAGNOSIS — Z8701 Personal history of pneumonia (recurrent): Secondary | ICD-10-CM | POA: Diagnosis not present

## 2024-05-07 DIAGNOSIS — E785 Hyperlipidemia, unspecified: Secondary | ICD-10-CM | POA: Diagnosis not present

## 2024-05-07 DIAGNOSIS — K529 Noninfective gastroenteritis and colitis, unspecified: Secondary | ICD-10-CM | POA: Diagnosis not present

## 2024-05-07 DIAGNOSIS — I351 Nonrheumatic aortic (valve) insufficiency: Secondary | ICD-10-CM | POA: Diagnosis not present

## 2024-05-07 DIAGNOSIS — I10 Essential (primary) hypertension: Secondary | ICD-10-CM | POA: Diagnosis not present

## 2024-05-07 DIAGNOSIS — M19042 Primary osteoarthritis, left hand: Secondary | ICD-10-CM | POA: Diagnosis not present

## 2024-05-07 DIAGNOSIS — Z556 Problems related to health literacy: Secondary | ICD-10-CM | POA: Diagnosis not present

## 2024-05-07 DIAGNOSIS — F0393 Unspecified dementia, unspecified severity, with mood disturbance: Secondary | ICD-10-CM | POA: Diagnosis not present

## 2024-05-07 DIAGNOSIS — M19041 Primary osteoarthritis, right hand: Secondary | ICD-10-CM | POA: Diagnosis not present

## 2024-05-07 DIAGNOSIS — H269 Unspecified cataract: Secondary | ICD-10-CM | POA: Diagnosis not present

## 2024-05-07 DIAGNOSIS — H9190 Unspecified hearing loss, unspecified ear: Secondary | ICD-10-CM | POA: Diagnosis not present

## 2024-05-07 DIAGNOSIS — Z860100 Personal history of colon polyps, unspecified: Secondary | ICD-10-CM | POA: Diagnosis not present

## 2024-05-07 DIAGNOSIS — K219 Gastro-esophageal reflux disease without esophagitis: Secondary | ICD-10-CM | POA: Diagnosis not present

## 2024-05-12 ENCOUNTER — Encounter: Payer: Self-pay | Admitting: Physician Assistant

## 2024-05-12 ENCOUNTER — Ambulatory Visit: Admitting: Physician Assistant

## 2024-05-12 DIAGNOSIS — L97911 Non-pressure chronic ulcer of unspecified part of right lower leg limited to breakdown of skin: Secondary | ICD-10-CM

## 2024-05-12 NOTE — Progress Notes (Signed)
 Office Visit Note   Patient: Bailey [name]           Date of Birth: 09/15/1941           MRN: [id number] Visit Date: 05/12/2024              Requested by: Shayne Anes, MD [address] [place],  [place] [address] PCP: Shayne Anes, MD  Chief Complaint  Patient presents with   Right Leg - Routine Post Op    04/16/24 I&D RLE abscess      HPI: Patient is is a 83 year old woman who is seen with her daughter. Patient underwent debridement of a large hematoma right lower extremity on 04/16/2024.  She continues to have swelling at the incision site.  Assessment & Plan: Visit Diagnoses: No diagnosis found.  Plan: Instructions were provided for Dial soap cleansing in the shower. Apply Vashe plus 4 x 4 plus an Ace wrap. Leg is to be elevated at all times above the heart and patient may undergo purposeful ambulation.   Follow-Up Instructions: Return in about 2 weeks (around 05/26/2024).   Ortho Exam  Patient is alert, oriented, no adenopathy, well-dressed, normal affect, normal respiratory effort. Incision edema with bruising.  Sutures were removed, she tolerated this well.      Imaging: No results found. No images are attached to the encounter.  Labs: Lab Results  Component Value Date   HGBA1C 5.1 08/15/2017   REPTSTATUS 01/10/2013 FINAL 01/06/2013   CULT  01/06/2013    NO SALMONELLA, SHIGELLA, CAMPYLOBACTER, YERSINIA, OR E.COLI 0157:H7 ISOLATED   LABORGA NO GROWTH 08/03/2015     Lab Results  Component Value Date   ALBUMIN 3.8 06/13/2021   ALBUMIN 2.8 (L) 01/08/2013   ALBUMIN 3.5 01/06/2013    No results found for: MG No results found for: VD25OH  No results found for: PREALBUMIN    Latest Ref Rng & Units 04/18/2024    8:03 AM 04/17/2024    6:26 AM 04/16/2024   12:48 PM  CBC EXTENDED  WBC 4.0 - 10.5 K/uL 10.9  9.0  6.2   RBC 3.87 - 5.11 MIL/uL 3.63  3.68  3.82   Hemoglobin 12.0 - 15.0 g/dL 88.7  88.7  88.1   HCT 36.0 - 46.0 % 34.1  34.6  36.5    Platelets 150 - 400 K/uL 242  170  184      There is no height or weight on file to calculate BMI.  Orders:  No orders of the defined types were placed in this encounter.  No orders of the defined types were placed in this encounter.    Procedures: No procedures performed  Clinical Data: No additional findings.  ROS:  All other systems negative, except as noted in the HPI. Review of Systems  Objective: Vital Signs: LMP 06/23/2010 (Approximate)   Specialty Comments:  No specialty comments available.  PMFS History: Patient Active Problem List   Diagnosis Date Noted   Traumatic open wound of right lower leg 04/16/2024   Hematoma of right lower leg 04/16/2024   Leg wound, right, initial encounter 04/16/2024   Closed right scapular fracture 10/12/2023   Compression fracture of body of thoracic vertebra (HCC) 10/12/2023   Fall with injury 10/11/2023   Aortic insufficiency 09/06/2017   Peripheral neuropathy 08/15/2017   Tinnitus 05/18/2014   Pleomorphic adenoma of minor salivary gland 01/13/2014   Diarrhea 01/06/2013   Nausea with vomiting 01/06/2013   Abdominal pain, right upper quadrant 01/06/2013  Palpitations 01/06/2013   Bright red blood per rectum 01/06/2013   Dehydration 01/06/2013   Hypokalemia 01/06/2013   Hyponatremia 01/06/2013   Acute renal failure (HCC) 01/06/2013   Migraine 03/14/2012   Dizziness 03/14/2012   Bicuspid aortic valve 02/27/2012   HTN (hypertension) 02/27/2012   Hyperlipidemia 02/27/2012   Past Medical History:  Diagnosis Date   Anxiety    Aortic insufficiency    Arthritis    hands (01/06/2013)   Basal cell carcinoma of skin    Bicuspid aortic valve    Cataract    right   Colitis, collagenous summer of 2013   Colon polyp    DDD (degenerative disc disease) 12/2009   Dementia (HCC)    GERD (gastroesophageal reflux disease)    /endoscopy; doesn't bother me at all (01/06/2013)   Hard of hearing    bilateral - no hearing  aids   Heart murmur    Hyperlipidemia    Hypertension    Left facial numbness    previous, not current   Migraine    used to have once/month; none in years (01/06/2013)   Palpitations    Pneumonia ~ 1944; ~ 2009   Tinnitus    Vertigo    hx    Family History  Problem Relation Age of Onset   Bone cancer Father    Hypertension Father    Thyroid  disease Father        hypo   Brain cancer Brother    Arrhythmia Mother    Osteoporosis Mother    Cancer Mother        colon   Diabetes Mother    Hypertension Mother     Past Surgical History:  Procedure Laterality Date   CATARACT EXTRACTION EXTRACAPSULAR Bilateral 2015   COLONOSCOPY N/A 01/07/2013   Procedure: COLONOSCOPY;  Surgeon: Lamar LULLA Bunk, MD;  Location: [facility] ENDOSCOPY;  Service: Endoscopy;  Laterality: N/A;  The colonoscopy will be unprepped   DILATION AND CURETTAGE OF UTERUS  2002   w/hysteroscopy   ESOPHAGOGASTRODUODENOSCOPY N/A 01/07/2013   Procedure: ESOPHAGOGASTRODUODENOSCOPY (EGD);  Surgeon: Lamar LULLA Bunk, MD;  Location: [facility] ENDOSCOPY;  Service: Endoscopy;  Laterality: N/A;   HYSTEROSCOPY  2002   D&C(sub fibroid)    HYSTEROSCOPY  2006   resection polyps D&C   INCISION AND DRAINAGE OF DEEP ABSCESS, CALF Right 04/16/2024   Procedure: INCISION AND DRAINAGE OF DEEP ABSCESS,  RIGHT LOWER EXTREMITY;  Surgeon: Harden Jerona LULLA, MD;  Location: [facility] OR;  Service: Orthopedics;  Laterality: Right;  RIGHT LEG DEBRIDEMENT   THYROIDECTOMY, PARTIAL     removed gland from left side of neck that was to go down and past thyroid  in front (01/06/2013)   TONSILLECTOMY     when I was little (01/06/2013)   TUBAL LIGATION Bilateral 1970's   TUMOR REMOVAL  1990's & 06/23/2012, 02/2014   benign; roof of mouth (01/06/2013)   Social History   Occupational History   Not on file  Tobacco Use   Smoking status: Never   Smokeless tobacco: Never  Vaping Use   Vaping status: Never Used  Substance and Sexual Activity   Alcohol  use: No     Alcohol /week: 0.0 standard drinks of alcohol    Drug use: No   Sexual activity: Not Currently    Partners: Male    Birth control/protection: Post-menopausal

## 2024-05-13 DIAGNOSIS — I10 Essential (primary) hypertension: Secondary | ICD-10-CM | POA: Diagnosis not present

## 2024-05-13 DIAGNOSIS — Z9181 History of falling: Secondary | ICD-10-CM | POA: Diagnosis not present

## 2024-05-13 DIAGNOSIS — G43909 Migraine, unspecified, not intractable, without status migrainosus: Secondary | ICD-10-CM | POA: Diagnosis not present

## 2024-05-13 DIAGNOSIS — F0393 Unspecified dementia, unspecified severity, with mood disturbance: Secondary | ICD-10-CM | POA: Diagnosis not present

## 2024-05-13 DIAGNOSIS — F0394 Unspecified dementia, unspecified severity, with anxiety: Secondary | ICD-10-CM | POA: Diagnosis not present

## 2024-05-13 DIAGNOSIS — M19041 Primary osteoarthritis, right hand: Secondary | ICD-10-CM | POA: Diagnosis not present

## 2024-05-13 DIAGNOSIS — I351 Nonrheumatic aortic (valve) insufficiency: Secondary | ICD-10-CM | POA: Diagnosis not present

## 2024-05-13 DIAGNOSIS — H269 Unspecified cataract: Secondary | ICD-10-CM | POA: Diagnosis not present

## 2024-05-13 DIAGNOSIS — S81801D Unspecified open wound, right lower leg, subsequent encounter: Secondary | ICD-10-CM | POA: Diagnosis not present

## 2024-05-13 DIAGNOSIS — K219 Gastro-esophageal reflux disease without esophagitis: Secondary | ICD-10-CM | POA: Diagnosis not present

## 2024-05-13 DIAGNOSIS — E871 Hypo-osmolality and hyponatremia: Secondary | ICD-10-CM | POA: Diagnosis not present

## 2024-05-13 DIAGNOSIS — H9190 Unspecified hearing loss, unspecified ear: Secondary | ICD-10-CM | POA: Diagnosis not present

## 2024-05-13 DIAGNOSIS — Z860100 Personal history of colon polyps, unspecified: Secondary | ICD-10-CM | POA: Diagnosis not present

## 2024-05-13 DIAGNOSIS — M19042 Primary osteoarthritis, left hand: Secondary | ICD-10-CM | POA: Diagnosis not present

## 2024-05-13 DIAGNOSIS — E785 Hyperlipidemia, unspecified: Secondary | ICD-10-CM | POA: Diagnosis not present

## 2024-05-13 DIAGNOSIS — E876 Hypokalemia: Secondary | ICD-10-CM | POA: Diagnosis not present

## 2024-05-13 DIAGNOSIS — Z556 Problems related to health literacy: Secondary | ICD-10-CM | POA: Diagnosis not present

## 2024-05-13 DIAGNOSIS — Z8701 Personal history of pneumonia (recurrent): Secondary | ICD-10-CM | POA: Diagnosis not present

## 2024-05-13 DIAGNOSIS — K529 Noninfective gastroenteritis and colitis, unspecified: Secondary | ICD-10-CM | POA: Diagnosis not present

## 2024-05-14 DIAGNOSIS — K529 Noninfective gastroenteritis and colitis, unspecified: Secondary | ICD-10-CM | POA: Diagnosis not present

## 2024-05-14 DIAGNOSIS — F0394 Unspecified dementia, unspecified severity, with anxiety: Secondary | ICD-10-CM | POA: Diagnosis not present

## 2024-05-14 DIAGNOSIS — H269 Unspecified cataract: Secondary | ICD-10-CM | POA: Diagnosis not present

## 2024-05-14 DIAGNOSIS — M19041 Primary osteoarthritis, right hand: Secondary | ICD-10-CM | POA: Diagnosis not present

## 2024-05-14 DIAGNOSIS — G43909 Migraine, unspecified, not intractable, without status migrainosus: Secondary | ICD-10-CM | POA: Diagnosis not present

## 2024-05-14 DIAGNOSIS — E876 Hypokalemia: Secondary | ICD-10-CM | POA: Diagnosis not present

## 2024-05-14 DIAGNOSIS — Z8701 Personal history of pneumonia (recurrent): Secondary | ICD-10-CM | POA: Diagnosis not present

## 2024-05-14 DIAGNOSIS — K219 Gastro-esophageal reflux disease without esophagitis: Secondary | ICD-10-CM | POA: Diagnosis not present

## 2024-05-14 DIAGNOSIS — Z9181 History of falling: Secondary | ICD-10-CM | POA: Diagnosis not present

## 2024-05-14 DIAGNOSIS — S81801D Unspecified open wound, right lower leg, subsequent encounter: Secondary | ICD-10-CM | POA: Diagnosis not present

## 2024-05-14 DIAGNOSIS — M19042 Primary osteoarthritis, left hand: Secondary | ICD-10-CM | POA: Diagnosis not present

## 2024-05-14 DIAGNOSIS — F0393 Unspecified dementia, unspecified severity, with mood disturbance: Secondary | ICD-10-CM | POA: Diagnosis not present

## 2024-05-14 DIAGNOSIS — H9190 Unspecified hearing loss, unspecified ear: Secondary | ICD-10-CM | POA: Diagnosis not present

## 2024-05-14 DIAGNOSIS — Z556 Problems related to health literacy: Secondary | ICD-10-CM | POA: Diagnosis not present

## 2024-05-14 DIAGNOSIS — I10 Essential (primary) hypertension: Secondary | ICD-10-CM | POA: Diagnosis not present

## 2024-05-14 DIAGNOSIS — I351 Nonrheumatic aortic (valve) insufficiency: Secondary | ICD-10-CM | POA: Diagnosis not present

## 2024-05-14 DIAGNOSIS — Z860100 Personal history of colon polyps, unspecified: Secondary | ICD-10-CM | POA: Diagnosis not present

## 2024-05-14 DIAGNOSIS — E871 Hypo-osmolality and hyponatremia: Secondary | ICD-10-CM | POA: Diagnosis not present

## 2024-05-14 DIAGNOSIS — E785 Hyperlipidemia, unspecified: Secondary | ICD-10-CM | POA: Diagnosis not present

## 2024-05-19 DIAGNOSIS — R32 Unspecified urinary incontinence: Secondary | ICD-10-CM | POA: Diagnosis not present

## 2024-05-19 DIAGNOSIS — F3342 Major depressive disorder, recurrent, in full remission: Secondary | ICD-10-CM | POA: Diagnosis not present

## 2024-05-20 DIAGNOSIS — H9190 Unspecified hearing loss, unspecified ear: Secondary | ICD-10-CM | POA: Diagnosis not present

## 2024-05-20 DIAGNOSIS — K219 Gastro-esophageal reflux disease without esophagitis: Secondary | ICD-10-CM | POA: Diagnosis not present

## 2024-05-20 DIAGNOSIS — E871 Hypo-osmolality and hyponatremia: Secondary | ICD-10-CM | POA: Diagnosis not present

## 2024-05-20 DIAGNOSIS — Z860100 Personal history of colon polyps, unspecified: Secondary | ICD-10-CM | POA: Diagnosis not present

## 2024-05-20 DIAGNOSIS — Z8701 Personal history of pneumonia (recurrent): Secondary | ICD-10-CM | POA: Diagnosis not present

## 2024-05-20 DIAGNOSIS — H269 Unspecified cataract: Secondary | ICD-10-CM | POA: Diagnosis not present

## 2024-05-20 DIAGNOSIS — S81801D Unspecified open wound, right lower leg, subsequent encounter: Secondary | ICD-10-CM | POA: Diagnosis not present

## 2024-05-20 DIAGNOSIS — G43909 Migraine, unspecified, not intractable, without status migrainosus: Secondary | ICD-10-CM | POA: Diagnosis not present

## 2024-05-20 DIAGNOSIS — M19042 Primary osteoarthritis, left hand: Secondary | ICD-10-CM | POA: Diagnosis not present

## 2024-05-20 DIAGNOSIS — E876 Hypokalemia: Secondary | ICD-10-CM | POA: Diagnosis not present

## 2024-05-20 DIAGNOSIS — E785 Hyperlipidemia, unspecified: Secondary | ICD-10-CM | POA: Diagnosis not present

## 2024-05-20 DIAGNOSIS — M19041 Primary osteoarthritis, right hand: Secondary | ICD-10-CM | POA: Diagnosis not present

## 2024-05-20 DIAGNOSIS — I10 Essential (primary) hypertension: Secondary | ICD-10-CM | POA: Diagnosis not present

## 2024-05-20 DIAGNOSIS — Z556 Problems related to health literacy: Secondary | ICD-10-CM | POA: Diagnosis not present

## 2024-05-20 DIAGNOSIS — F0394 Unspecified dementia, unspecified severity, with anxiety: Secondary | ICD-10-CM | POA: Diagnosis not present

## 2024-05-20 DIAGNOSIS — Z9181 History of falling: Secondary | ICD-10-CM | POA: Diagnosis not present

## 2024-05-20 DIAGNOSIS — K529 Noninfective gastroenteritis and colitis, unspecified: Secondary | ICD-10-CM | POA: Diagnosis not present

## 2024-05-20 DIAGNOSIS — F0393 Unspecified dementia, unspecified severity, with mood disturbance: Secondary | ICD-10-CM | POA: Diagnosis not present

## 2024-05-20 DIAGNOSIS — I351 Nonrheumatic aortic (valve) insufficiency: Secondary | ICD-10-CM | POA: Diagnosis not present

## 2024-05-21 DIAGNOSIS — K529 Noninfective gastroenteritis and colitis, unspecified: Secondary | ICD-10-CM | POA: Diagnosis not present

## 2024-05-21 DIAGNOSIS — Z556 Problems related to health literacy: Secondary | ICD-10-CM | POA: Diagnosis not present

## 2024-05-21 DIAGNOSIS — Z9181 History of falling: Secondary | ICD-10-CM | POA: Diagnosis not present

## 2024-05-21 DIAGNOSIS — Z860100 Personal history of colon polyps, unspecified: Secondary | ICD-10-CM | POA: Diagnosis not present

## 2024-05-21 DIAGNOSIS — K219 Gastro-esophageal reflux disease without esophagitis: Secondary | ICD-10-CM | POA: Diagnosis not present

## 2024-05-21 DIAGNOSIS — E876 Hypokalemia: Secondary | ICD-10-CM | POA: Diagnosis not present

## 2024-05-21 DIAGNOSIS — S81801D Unspecified open wound, right lower leg, subsequent encounter: Secondary | ICD-10-CM | POA: Diagnosis not present

## 2024-05-21 DIAGNOSIS — F0394 Unspecified dementia, unspecified severity, with anxiety: Secondary | ICD-10-CM | POA: Diagnosis not present

## 2024-05-21 DIAGNOSIS — H269 Unspecified cataract: Secondary | ICD-10-CM | POA: Diagnosis not present

## 2024-05-21 DIAGNOSIS — I351 Nonrheumatic aortic (valve) insufficiency: Secondary | ICD-10-CM | POA: Diagnosis not present

## 2024-05-21 DIAGNOSIS — M19041 Primary osteoarthritis, right hand: Secondary | ICD-10-CM | POA: Diagnosis not present

## 2024-05-21 DIAGNOSIS — H9190 Unspecified hearing loss, unspecified ear: Secondary | ICD-10-CM | POA: Diagnosis not present

## 2024-05-21 DIAGNOSIS — I10 Essential (primary) hypertension: Secondary | ICD-10-CM | POA: Diagnosis not present

## 2024-05-21 DIAGNOSIS — E785 Hyperlipidemia, unspecified: Secondary | ICD-10-CM | POA: Diagnosis not present

## 2024-05-21 DIAGNOSIS — E871 Hypo-osmolality and hyponatremia: Secondary | ICD-10-CM | POA: Diagnosis not present

## 2024-05-21 DIAGNOSIS — G43909 Migraine, unspecified, not intractable, without status migrainosus: Secondary | ICD-10-CM | POA: Diagnosis not present

## 2024-05-21 DIAGNOSIS — M19042 Primary osteoarthritis, left hand: Secondary | ICD-10-CM | POA: Diagnosis not present

## 2024-05-21 DIAGNOSIS — F0393 Unspecified dementia, unspecified severity, with mood disturbance: Secondary | ICD-10-CM | POA: Diagnosis not present

## 2024-05-21 DIAGNOSIS — Z8701 Personal history of pneumonia (recurrent): Secondary | ICD-10-CM | POA: Diagnosis not present

## 2024-05-26 ENCOUNTER — Ambulatory Visit: Admitting: Orthopedic Surgery

## 2024-05-26 ENCOUNTER — Encounter: Payer: Self-pay | Admitting: Orthopedic Surgery

## 2024-05-26 DIAGNOSIS — L97911 Non-pressure chronic ulcer of unspecified part of right lower leg limited to breakdown of skin: Secondary | ICD-10-CM

## 2024-05-26 NOTE — Progress Notes (Signed)
 Office Visit Note   Patient: Bailey [name]           Date of Birth: 05/20/1941           MRN: [id number] Visit Date: 05/26/2024              Requested by: Shayne Anes, MD [address] [place],  [place] [address] PCP: Shayne Anes, MD  Chief Complaint  Patient presents with   Right Leg - Routine Post Op      HPI: Patient is an 83 year old woman who is seen in follow-up for traumatic ulceration anterior lateral right leg.  Currently undergoing Vashe dressing changes with an Ace wrap.  Assessment & Plan: Visit Diagnoses:  1. Ulcer of right leg, limited to breakdown of skin (HCC)     Plan: Continue elevation continue daily Vashe dressing changes.  Follow-Up Instructions: Return in about 2 weeks (around 06/09/2024).   Ortho Exam  Patient is alert, oriented, no adenopathy, well-dressed, normal affect, normal respiratory effort. Examination the wound bed has 50% healthy granulation tissue.  There is a large nonviable eschar distally and this was removed with a 10 blade knife.  There is no surrounding cellulitis no tunneling no signs of infection.  Wound measures 2 x 11 cm.    Imaging: No results found. No images are attached to the encounter.  Labs: Lab Results  Component Value Date   HGBA1C 5.1 08/15/2017   REPTSTATUS 01/10/2013 FINAL 01/06/2013   CULT  01/06/2013    NO SALMONELLA, SHIGELLA, CAMPYLOBACTER, YERSINIA, OR E.COLI 0157:H7 ISOLATED   LABORGA NO GROWTH 08/03/2015     Lab Results  Component Value Date   ALBUMIN 3.8 06/13/2021   ALBUMIN 2.8 (L) 01/08/2013   ALBUMIN 3.5 01/06/2013    No results found for: MG No results found for: VD25OH  No results found for: PREALBUMIN    Latest Ref Rng & Units 04/18/2024    8:03 AM 04/17/2024    6:26 AM 04/16/2024   12:48 PM  CBC EXTENDED  WBC 4.0 - 10.5 K/uL 10.9  9.0  6.2   RBC 3.87 - 5.11 MIL/uL 3.63  3.68  3.82   Hemoglobin 12.0 - 15.0 g/dL 88.7  88.7  88.1   HCT 36.0 - 46.0 % 34.1  34.6  36.5    Platelets 150 - 400 K/uL 242  170  184      There is no height or weight on file to calculate BMI.  Orders:  No orders of the defined types were placed in this encounter.  No orders of the defined types were placed in this encounter.    Procedures: No procedures performed  Clinical Data: No additional findings.  ROS:  All other systems negative, except as noted in the HPI. Review of Systems  Objective: Vital Signs: LMP 06/23/2010 (Approximate)   Specialty Comments:  No specialty comments available.  PMFS History: Patient Active Problem List   Diagnosis Date Noted   Traumatic open wound of right lower leg 04/16/2024   Hematoma of right lower leg 04/16/2024   Leg wound, right, initial encounter 04/16/2024   Closed right scapular fracture 10/12/2023   Compression fracture of body of thoracic vertebra (HCC) 10/12/2023   Fall with injury 10/11/2023   Aortic insufficiency 09/06/2017   Peripheral neuropathy 08/15/2017   Tinnitus 05/18/2014   Pleomorphic adenoma of minor salivary gland 01/13/2014   Diarrhea 01/06/2013   Nausea with vomiting 01/06/2013   Abdominal pain, right upper quadrant 01/06/2013   Palpitations  01/06/2013   Bright red blood per rectum 01/06/2013   Dehydration 01/06/2013   Hypokalemia 01/06/2013   Hyponatremia 01/06/2013   Acute renal failure (HCC) 01/06/2013   Migraine 03/14/2012   Dizziness 03/14/2012   Bicuspid aortic valve 02/27/2012   HTN (hypertension) 02/27/2012   Hyperlipidemia 02/27/2012   Past Medical History:  Diagnosis Date   Anxiety    Aortic insufficiency    Arthritis    hands (01/06/2013)   Basal cell carcinoma of skin    Bicuspid aortic valve    Cataract    right   Colitis, collagenous summer of 2013   Colon polyp    DDD (degenerative disc disease) 12/2009   Dementia (HCC)    GERD (gastroesophageal reflux disease)    /endoscopy; doesn't bother me at all (01/06/2013)   Hard of hearing    bilateral - no hearing  aids   Heart murmur    Hyperlipidemia    Hypertension    Left facial numbness    previous, not current   Migraine    used to have once/month; none in years (01/06/2013)   Palpitations    Pneumonia ~ 1944; ~ 2009   Tinnitus    Vertigo    hx    Family History  Problem Relation Age of Onset   Bone cancer Father    Hypertension Father    Thyroid  disease Father        hypo   Brain cancer Brother    Arrhythmia Mother    Osteoporosis Mother    Cancer Mother        colon   Diabetes Mother    Hypertension Mother     Past Surgical History:  Procedure Laterality Date   CATARACT EXTRACTION EXTRACAPSULAR Bilateral 2015   COLONOSCOPY N/A 01/07/2013   Procedure: COLONOSCOPY;  Surgeon: Lamar LULLA Bunk, MD;  Location: [facility] ENDOSCOPY;  Service: Endoscopy;  Laterality: N/A;  The colonoscopy will be unprepped   DILATION AND CURETTAGE OF UTERUS  2002   w/hysteroscopy   ESOPHAGOGASTRODUODENOSCOPY N/A 01/07/2013   Procedure: ESOPHAGOGASTRODUODENOSCOPY (EGD);  Surgeon: Lamar LULLA Bunk, MD;  Location: [facility] ENDOSCOPY;  Service: Endoscopy;  Laterality: N/A;   HYSTEROSCOPY  2002   D&C(sub fibroid)    HYSTEROSCOPY  2006   resection polyps D&C   INCISION AND DRAINAGE OF DEEP ABSCESS, CALF Right 04/16/2024   Procedure: INCISION AND DRAINAGE OF DEEP ABSCESS,  RIGHT LOWER EXTREMITY;  Surgeon: Harden Jerona LULLA, MD;  Location: [facility] OR;  Service: Orthopedics;  Laterality: Right;  RIGHT LEG DEBRIDEMENT   THYROIDECTOMY, PARTIAL     removed gland from left side of neck that was to go down and past thyroid  in front (01/06/2013)   TONSILLECTOMY     when I was little (01/06/2013)   TUBAL LIGATION Bilateral 1970's   TUMOR REMOVAL  1990's & 06/23/2012, 02/2014   benign; roof of mouth (01/06/2013)   Social History   Occupational History   Not on file  Tobacco Use   Smoking status: Never   Smokeless tobacco: Never  Vaping Use   Vaping status: Never Used  Substance and Sexual Activity   Alcohol  use: No     Alcohol /week: 0.0 standard drinks of alcohol    Drug use: No   Sexual activity: Not Currently    Partners: Male    Birth control/protection: Post-menopausal

## 2024-05-27 ENCOUNTER — Telehealth: Payer: Self-pay | Admitting: Orthopedic Surgery

## 2024-05-27 ENCOUNTER — Telehealth: Payer: Self-pay

## 2024-05-27 ENCOUNTER — Other Ambulatory Visit: Payer: Self-pay | Admitting: Orthopedic Surgery

## 2024-05-27 DIAGNOSIS — Z860100 Personal history of colon polyps, unspecified: Secondary | ICD-10-CM | POA: Diagnosis not present

## 2024-05-27 DIAGNOSIS — G43909 Migraine, unspecified, not intractable, without status migrainosus: Secondary | ICD-10-CM | POA: Diagnosis not present

## 2024-05-27 DIAGNOSIS — M19042 Primary osteoarthritis, left hand: Secondary | ICD-10-CM | POA: Diagnosis not present

## 2024-05-27 DIAGNOSIS — E871 Hypo-osmolality and hyponatremia: Secondary | ICD-10-CM | POA: Diagnosis not present

## 2024-05-27 DIAGNOSIS — E876 Hypokalemia: Secondary | ICD-10-CM | POA: Diagnosis not present

## 2024-05-27 DIAGNOSIS — S81801D Unspecified open wound, right lower leg, subsequent encounter: Secondary | ICD-10-CM | POA: Diagnosis not present

## 2024-05-27 DIAGNOSIS — K529 Noninfective gastroenteritis and colitis, unspecified: Secondary | ICD-10-CM | POA: Diagnosis not present

## 2024-05-27 DIAGNOSIS — I351 Nonrheumatic aortic (valve) insufficiency: Secondary | ICD-10-CM | POA: Diagnosis not present

## 2024-05-27 DIAGNOSIS — F0393 Unspecified dementia, unspecified severity, with mood disturbance: Secondary | ICD-10-CM | POA: Diagnosis not present

## 2024-05-27 DIAGNOSIS — H269 Unspecified cataract: Secondary | ICD-10-CM | POA: Diagnosis not present

## 2024-05-27 DIAGNOSIS — Z8701 Personal history of pneumonia (recurrent): Secondary | ICD-10-CM | POA: Diagnosis not present

## 2024-05-27 DIAGNOSIS — F0394 Unspecified dementia, unspecified severity, with anxiety: Secondary | ICD-10-CM | POA: Diagnosis not present

## 2024-05-27 DIAGNOSIS — K219 Gastro-esophageal reflux disease without esophagitis: Secondary | ICD-10-CM | POA: Diagnosis not present

## 2024-05-27 DIAGNOSIS — Z556 Problems related to health literacy: Secondary | ICD-10-CM | POA: Diagnosis not present

## 2024-05-27 DIAGNOSIS — E785 Hyperlipidemia, unspecified: Secondary | ICD-10-CM | POA: Diagnosis not present

## 2024-05-27 DIAGNOSIS — I10 Essential (primary) hypertension: Secondary | ICD-10-CM | POA: Diagnosis not present

## 2024-05-27 DIAGNOSIS — H9190 Unspecified hearing loss, unspecified ear: Secondary | ICD-10-CM | POA: Diagnosis not present

## 2024-05-27 DIAGNOSIS — M19041 Primary osteoarthritis, right hand: Secondary | ICD-10-CM | POA: Diagnosis not present

## 2024-05-27 DIAGNOSIS — Z9181 History of falling: Secondary | ICD-10-CM | POA: Diagnosis not present

## 2024-05-27 MED ORDER — DOXYCYCLINE HYCLATE 100 MG PO TABS: 100.0000 mg | ORAL_TABLET | Freq: Two times a day (BID) | ORAL | 0 refills | Status: AC

## 2024-05-27 NOTE — Telephone Encounter (Signed)
 A home health nurse called stating that the pt's leg is red and is warm to the touch. They would like an antibiotic prescription for the pt.

## 2024-05-27 NOTE — Telephone Encounter (Signed)
 Pt was in the office yesterday. Please see message below and advise if you want to give rx for abx.

## 2024-05-27 NOTE — Telephone Encounter (Signed)
 Message has been sent to Dr. Harden for review this is a duplicate. Will sign off on this and await advisement.

## 2024-05-28 NOTE — Telephone Encounter (Signed)
 Of the many times the [facility] called the office there was not a name or phone number taken. I called the mobile number in the pt's chart and lm on vm to advise that rx for abx has been sent to pharm and to call with any questions.

## 2024-05-30 DIAGNOSIS — F0394 Unspecified dementia, unspecified severity, with anxiety: Secondary | ICD-10-CM | POA: Diagnosis not present

## 2024-05-30 DIAGNOSIS — M19042 Primary osteoarthritis, left hand: Secondary | ICD-10-CM | POA: Diagnosis not present

## 2024-05-30 DIAGNOSIS — H269 Unspecified cataract: Secondary | ICD-10-CM | POA: Diagnosis not present

## 2024-05-30 DIAGNOSIS — K219 Gastro-esophageal reflux disease without esophagitis: Secondary | ICD-10-CM | POA: Diagnosis not present

## 2024-05-30 DIAGNOSIS — E871 Hypo-osmolality and hyponatremia: Secondary | ICD-10-CM | POA: Diagnosis not present

## 2024-05-30 DIAGNOSIS — Z556 Problems related to health literacy: Secondary | ICD-10-CM | POA: Diagnosis not present

## 2024-05-30 DIAGNOSIS — K529 Noninfective gastroenteritis and colitis, unspecified: Secondary | ICD-10-CM | POA: Diagnosis not present

## 2024-05-30 DIAGNOSIS — Z860100 Personal history of colon polyps, unspecified: Secondary | ICD-10-CM | POA: Diagnosis not present

## 2024-05-30 DIAGNOSIS — Z9181 History of falling: Secondary | ICD-10-CM | POA: Diagnosis not present

## 2024-05-30 DIAGNOSIS — I351 Nonrheumatic aortic (valve) insufficiency: Secondary | ICD-10-CM | POA: Diagnosis not present

## 2024-05-30 DIAGNOSIS — M19041 Primary osteoarthritis, right hand: Secondary | ICD-10-CM | POA: Diagnosis not present

## 2024-05-30 DIAGNOSIS — G43909 Migraine, unspecified, not intractable, without status migrainosus: Secondary | ICD-10-CM | POA: Diagnosis not present

## 2024-05-30 DIAGNOSIS — E876 Hypokalemia: Secondary | ICD-10-CM | POA: Diagnosis not present

## 2024-05-30 DIAGNOSIS — Z8701 Personal history of pneumonia (recurrent): Secondary | ICD-10-CM | POA: Diagnosis not present

## 2024-05-30 DIAGNOSIS — H9190 Unspecified hearing loss, unspecified ear: Secondary | ICD-10-CM | POA: Diagnosis not present

## 2024-05-30 DIAGNOSIS — E785 Hyperlipidemia, unspecified: Secondary | ICD-10-CM | POA: Diagnosis not present

## 2024-05-30 DIAGNOSIS — F0393 Unspecified dementia, unspecified severity, with mood disturbance: Secondary | ICD-10-CM | POA: Diagnosis not present

## 2024-05-30 DIAGNOSIS — I10 Essential (primary) hypertension: Secondary | ICD-10-CM | POA: Diagnosis not present

## 2024-05-30 DIAGNOSIS — S81801D Unspecified open wound, right lower leg, subsequent encounter: Secondary | ICD-10-CM | POA: Diagnosis not present

## 2024-06-03 DIAGNOSIS — H9190 Unspecified hearing loss, unspecified ear: Secondary | ICD-10-CM | POA: Diagnosis not present

## 2024-06-03 DIAGNOSIS — I351 Nonrheumatic aortic (valve) insufficiency: Secondary | ICD-10-CM | POA: Diagnosis not present

## 2024-06-03 DIAGNOSIS — F0394 Unspecified dementia, unspecified severity, with anxiety: Secondary | ICD-10-CM | POA: Diagnosis not present

## 2024-06-03 DIAGNOSIS — E785 Hyperlipidemia, unspecified: Secondary | ICD-10-CM | POA: Diagnosis not present

## 2024-06-03 DIAGNOSIS — Z556 Problems related to health literacy: Secondary | ICD-10-CM | POA: Diagnosis not present

## 2024-06-03 DIAGNOSIS — F0393 Unspecified dementia, unspecified severity, with mood disturbance: Secondary | ICD-10-CM | POA: Diagnosis not present

## 2024-06-03 DIAGNOSIS — K529 Noninfective gastroenteritis and colitis, unspecified: Secondary | ICD-10-CM | POA: Diagnosis not present

## 2024-06-03 DIAGNOSIS — S81801D Unspecified open wound, right lower leg, subsequent encounter: Secondary | ICD-10-CM | POA: Diagnosis not present

## 2024-06-03 DIAGNOSIS — M19041 Primary osteoarthritis, right hand: Secondary | ICD-10-CM | POA: Diagnosis not present

## 2024-06-03 DIAGNOSIS — M19042 Primary osteoarthritis, left hand: Secondary | ICD-10-CM | POA: Diagnosis not present

## 2024-06-03 DIAGNOSIS — K219 Gastro-esophageal reflux disease without esophagitis: Secondary | ICD-10-CM | POA: Diagnosis not present

## 2024-06-03 DIAGNOSIS — H269 Unspecified cataract: Secondary | ICD-10-CM | POA: Diagnosis not present

## 2024-06-03 DIAGNOSIS — E871 Hypo-osmolality and hyponatremia: Secondary | ICD-10-CM | POA: Diagnosis not present

## 2024-06-03 DIAGNOSIS — G43909 Migraine, unspecified, not intractable, without status migrainosus: Secondary | ICD-10-CM | POA: Diagnosis not present

## 2024-06-03 DIAGNOSIS — Z8701 Personal history of pneumonia (recurrent): Secondary | ICD-10-CM | POA: Diagnosis not present

## 2024-06-03 DIAGNOSIS — Z9181 History of falling: Secondary | ICD-10-CM | POA: Diagnosis not present

## 2024-06-03 DIAGNOSIS — Z860100 Personal history of colon polyps, unspecified: Secondary | ICD-10-CM | POA: Diagnosis not present

## 2024-06-03 DIAGNOSIS — I10 Essential (primary) hypertension: Secondary | ICD-10-CM | POA: Diagnosis not present

## 2024-06-03 DIAGNOSIS — E876 Hypokalemia: Secondary | ICD-10-CM | POA: Diagnosis not present

## 2024-06-10 DIAGNOSIS — K529 Noninfective gastroenteritis and colitis, unspecified: Secondary | ICD-10-CM | POA: Diagnosis not present

## 2024-06-10 DIAGNOSIS — E785 Hyperlipidemia, unspecified: Secondary | ICD-10-CM | POA: Diagnosis not present

## 2024-06-10 DIAGNOSIS — S81801D Unspecified open wound, right lower leg, subsequent encounter: Secondary | ICD-10-CM | POA: Diagnosis not present

## 2024-06-10 DIAGNOSIS — I10 Essential (primary) hypertension: Secondary | ICD-10-CM | POA: Diagnosis not present

## 2024-06-10 DIAGNOSIS — F0394 Unspecified dementia, unspecified severity, with anxiety: Secondary | ICD-10-CM | POA: Diagnosis not present

## 2024-06-10 DIAGNOSIS — E871 Hypo-osmolality and hyponatremia: Secondary | ICD-10-CM | POA: Diagnosis not present

## 2024-06-10 DIAGNOSIS — H269 Unspecified cataract: Secondary | ICD-10-CM | POA: Diagnosis not present

## 2024-06-10 DIAGNOSIS — Z8701 Personal history of pneumonia (recurrent): Secondary | ICD-10-CM | POA: Diagnosis not present

## 2024-06-10 DIAGNOSIS — Z860100 Personal history of colon polyps, unspecified: Secondary | ICD-10-CM | POA: Diagnosis not present

## 2024-06-10 DIAGNOSIS — M19041 Primary osteoarthritis, right hand: Secondary | ICD-10-CM | POA: Diagnosis not present

## 2024-06-10 DIAGNOSIS — M19042 Primary osteoarthritis, left hand: Secondary | ICD-10-CM | POA: Diagnosis not present

## 2024-06-10 DIAGNOSIS — I351 Nonrheumatic aortic (valve) insufficiency: Secondary | ICD-10-CM | POA: Diagnosis not present

## 2024-06-10 DIAGNOSIS — F0393 Unspecified dementia, unspecified severity, with mood disturbance: Secondary | ICD-10-CM | POA: Diagnosis not present

## 2024-06-10 DIAGNOSIS — Z9181 History of falling: Secondary | ICD-10-CM | POA: Diagnosis not present

## 2024-06-10 DIAGNOSIS — E876 Hypokalemia: Secondary | ICD-10-CM | POA: Diagnosis not present

## 2024-06-10 DIAGNOSIS — Z556 Problems related to health literacy: Secondary | ICD-10-CM | POA: Diagnosis not present

## 2024-06-10 DIAGNOSIS — H9190 Unspecified hearing loss, unspecified ear: Secondary | ICD-10-CM | POA: Diagnosis not present

## 2024-06-10 DIAGNOSIS — K219 Gastro-esophageal reflux disease without esophagitis: Secondary | ICD-10-CM | POA: Diagnosis not present

## 2024-06-10 DIAGNOSIS — G43909 Migraine, unspecified, not intractable, without status migrainosus: Secondary | ICD-10-CM | POA: Diagnosis not present

## 2024-06-11 DIAGNOSIS — G43909 Migraine, unspecified, not intractable, without status migrainosus: Secondary | ICD-10-CM | POA: Diagnosis not present

## 2024-06-11 DIAGNOSIS — F0393 Unspecified dementia, unspecified severity, with mood disturbance: Secondary | ICD-10-CM | POA: Diagnosis not present

## 2024-06-11 DIAGNOSIS — I10 Essential (primary) hypertension: Secondary | ICD-10-CM | POA: Diagnosis not present

## 2024-06-11 DIAGNOSIS — Z556 Problems related to health literacy: Secondary | ICD-10-CM | POA: Diagnosis not present

## 2024-06-11 DIAGNOSIS — Z9181 History of falling: Secondary | ICD-10-CM | POA: Diagnosis not present

## 2024-06-11 DIAGNOSIS — Z8701 Personal history of pneumonia (recurrent): Secondary | ICD-10-CM | POA: Diagnosis not present

## 2024-06-11 DIAGNOSIS — F0394 Unspecified dementia, unspecified severity, with anxiety: Secondary | ICD-10-CM | POA: Diagnosis not present

## 2024-06-11 DIAGNOSIS — Z860100 Personal history of colon polyps, unspecified: Secondary | ICD-10-CM | POA: Diagnosis not present

## 2024-06-11 DIAGNOSIS — E876 Hypokalemia: Secondary | ICD-10-CM | POA: Diagnosis not present

## 2024-06-11 DIAGNOSIS — I351 Nonrheumatic aortic (valve) insufficiency: Secondary | ICD-10-CM | POA: Diagnosis not present

## 2024-06-11 DIAGNOSIS — K219 Gastro-esophageal reflux disease without esophagitis: Secondary | ICD-10-CM | POA: Diagnosis not present

## 2024-06-11 DIAGNOSIS — H9190 Unspecified hearing loss, unspecified ear: Secondary | ICD-10-CM | POA: Diagnosis not present

## 2024-06-11 DIAGNOSIS — E785 Hyperlipidemia, unspecified: Secondary | ICD-10-CM | POA: Diagnosis not present

## 2024-06-11 DIAGNOSIS — H269 Unspecified cataract: Secondary | ICD-10-CM | POA: Diagnosis not present

## 2024-06-11 DIAGNOSIS — S81801D Unspecified open wound, right lower leg, subsequent encounter: Secondary | ICD-10-CM | POA: Diagnosis not present

## 2024-06-11 DIAGNOSIS — K529 Noninfective gastroenteritis and colitis, unspecified: Secondary | ICD-10-CM | POA: Diagnosis not present

## 2024-06-11 DIAGNOSIS — M19041 Primary osteoarthritis, right hand: Secondary | ICD-10-CM | POA: Diagnosis not present

## 2024-06-11 DIAGNOSIS — E871 Hypo-osmolality and hyponatremia: Secondary | ICD-10-CM | POA: Diagnosis not present

## 2024-06-11 DIAGNOSIS — M19042 Primary osteoarthritis, left hand: Secondary | ICD-10-CM | POA: Diagnosis not present

## 2024-06-17 ENCOUNTER — Ambulatory Visit (INDEPENDENT_AMBULATORY_CARE_PROVIDER_SITE_OTHER): Admitting: Orthopedic Surgery

## 2024-06-17 ENCOUNTER — Encounter: Payer: Self-pay | Admitting: Orthopedic Surgery

## 2024-06-17 DIAGNOSIS — L97911 Non-pressure chronic ulcer of unspecified part of right lower leg limited to breakdown of skin: Secondary | ICD-10-CM

## 2024-06-17 NOTE — Progress Notes (Signed)
 Office Visit Note   Patient: Bailey [name]           Date of Birth: 1941/05/08           MRN: [id number] Visit Date: 06/17/2024              Requested by: Shayne Anes, MD [address] [place],  [place] [address] PCP: Shayne Anes, MD  Chief Complaint  Patient presents with   Right Leg - Routine Post Op      HPI: Discussed the use of AI scribe software for clinical note transcription with the patient, who gave verbal consent to proceed.  History of Present Illness Bailey [name] is an 83 year old female who presents for follow-up of a leg wound.  The leg wound has been present for three months and has shown significant improvement.  She has been managing the wound with gauze and is considering the use of compression socks. There is concern about the wound being bloody and whether it will adhere to the sock. She is worried about the sock staying on and mentions that it has been a long three months.  She recalls an incident where she managed to get out of a bed with restraints while in the hospital, indicating a strong will and determination. She wants to avoid returning to the hospital.  She inquires about the dryness of the area around the wound and is concerned about the appearance of the wound, describing it as ugly. She is relieved that amputation was not necessary.  No pain associated with the wound.     Assessment & Plan: Visit Diagnoses:  1. Ulcer of right leg, limited to breakdown of skin (HCC)     Plan: Assessment and Plan Assessment & Plan Chronic left lower leg wound Chronic wound improving with healthy granulation tissue, measuring 7 cm by 2 cm with some bloody drainage. - Instructed to shower and clean wound with soap and water. - Apply compression socks (Vivewear) continuously, except during bathing. - Wash and dry socks after use, replace with new pair. - Apply lotion to surrounding skin daily, avoiding wound. - Encouraged leg elevation. -  Schedule follow-up appointment in four weeks.      Follow-Up Instructions: Return in about 4 weeks (around 07/15/2024).   Ortho Exam  Patient is alert, oriented, no adenopathy, well-dressed, normal affect, normal respiratory effort. Physical Exam EXTREMITIES: Calf circumference 32 cm. SKIN: Flat, healthy granulation tissue.      Imaging: No results found.   Labs: Lab Results  Component Value Date   HGBA1C 5.1 08/15/2017   REPTSTATUS 01/10/2013 FINAL 01/06/2013   CULT  01/06/2013    NO SALMONELLA, SHIGELLA, CAMPYLOBACTER, YERSINIA, OR E.COLI 0157:H7 ISOLATED   LABORGA NO GROWTH 08/03/2015     Lab Results  Component Value Date   ALBUMIN 3.8 06/13/2021   ALBUMIN 2.8 (L) 01/08/2013   ALBUMIN 3.5 01/06/2013    No results found for: MG No results found for: VD25OH  No results found for: PREALBUMIN    Latest Ref Rng & Units 04/18/2024    8:03 AM 04/17/2024    6:26 AM 04/16/2024   12:48 PM  CBC EXTENDED  WBC 4.0 - 10.5 K/uL 10.9  9.0  6.2   RBC 3.87 - 5.11 MIL/uL 3.63  3.68  3.82   Hemoglobin 12.0 - 15.0 g/dL 88.7  88.7  88.1   HCT 36.0 - 46.0 % 34.1  34.6  36.5   Platelets 150 - 400 K/uL 242  170  184      There is no height or weight on file to calculate BMI.  Orders:  No orders of the defined types were placed in this encounter.  No orders of the defined types were placed in this encounter.    Procedures: No procedures performed  Clinical Data: No additional findings.  ROS:  All other systems negative, except as noted in the HPI. Review of Systems  Objective: Vital Signs: LMP 06/23/2010 (Approximate)   Specialty Comments:  No specialty comments available.  PMFS History: Patient Active Problem List   Diagnosis Date Noted   Traumatic open wound of right lower leg 04/16/2024   Hematoma of right lower leg 04/16/2024   Leg wound, right, initial encounter 04/16/2024   Closed right scapular fracture 10/12/2023   Compression fracture of  body of thoracic vertebra (HCC) 10/12/2023   Fall with injury 10/11/2023   Aortic insufficiency 09/06/2017   Peripheral neuropathy 08/15/2017   Tinnitus 05/18/2014   Pleomorphic adenoma of minor salivary gland 01/13/2014   Diarrhea 01/06/2013   Nausea with vomiting 01/06/2013   Abdominal pain, right upper quadrant 01/06/2013   Palpitations 01/06/2013   Bright red blood per rectum 01/06/2013   Dehydration 01/06/2013   Hypokalemia 01/06/2013   Hyponatremia 01/06/2013   Acute renal failure (HCC) 01/06/2013   Migraine 03/14/2012   Dizziness 03/14/2012   Bicuspid aortic valve 02/27/2012   HTN (hypertension) 02/27/2012   Hyperlipidemia 02/27/2012   Past Medical History:  Diagnosis Date   Anxiety    Aortic insufficiency    Arthritis    hands (01/06/2013)   Basal cell carcinoma of skin    Bicuspid aortic valve    Cataract    right   Colitis, collagenous summer of 2013   Colon polyp    DDD (degenerative disc disease) 12/2009   Dementia (HCC)    GERD (gastroesophageal reflux disease)    /endoscopy; doesn't bother me at all (01/06/2013)   Hard of hearing    bilateral - no hearing aids   Heart murmur    Hyperlipidemia    Hypertension    Left facial numbness    previous, not current   Migraine    used to have once/month; none in years (01/06/2013)   Palpitations    Pneumonia ~ 1944; ~ 2009   Tinnitus    Vertigo    hx    Family History  Problem Relation Age of Onset   Bone cancer Father    Hypertension Father    Thyroid  disease Father        hypo   Brain cancer Brother    Arrhythmia Mother    Osteoporosis Mother    Cancer Mother        colon   Diabetes Mother    Hypertension Mother     Past Surgical History:  Procedure Laterality Date   CATARACT EXTRACTION EXTRACAPSULAR Bilateral 2015   COLONOSCOPY N/A 01/07/2013   Procedure: COLONOSCOPY;  Surgeon: Lamar LULLA Bunk, MD;  Location: [facility] ENDOSCOPY;  Service: Endoscopy;  Laterality: N/A;  The colonoscopy will be  unprepped   DILATION AND CURETTAGE OF UTERUS  2002   w/hysteroscopy   ESOPHAGOGASTRODUODENOSCOPY N/A 01/07/2013   Procedure: ESOPHAGOGASTRODUODENOSCOPY (EGD);  Surgeon: Lamar LULLA Bunk, MD;  Location: [facility] ENDOSCOPY;  Service: Endoscopy;  Laterality: N/A;   HYSTEROSCOPY  2002   D&C(sub fibroid)    HYSTEROSCOPY  2006   resection polyps D&C   INCISION AND DRAINAGE OF DEEP ABSCESS, CALF Right 04/16/2024  Procedure: INCISION AND DRAINAGE OF DEEP ABSCESS,  RIGHT LOWER EXTREMITY;  Surgeon: Harden Jerona GAILS, MD;  Location: [facility] OR;  Service: Orthopedics;  Laterality: Right;  RIGHT LEG DEBRIDEMENT   THYROIDECTOMY, PARTIAL     removed gland from left side of neck that was to go down and past thyroid  in front (01/06/2013)   TONSILLECTOMY     when I was little (01/06/2013)   TUBAL LIGATION Bilateral 1970's   TUMOR REMOVAL  1990's & 06/23/2012, 02/2014   benign; roof of mouth (01/06/2013)   Social History   Occupational History   Not on file  Tobacco Use   Smoking status: Never   Smokeless tobacco: Never  Vaping Use   Vaping status: Never Used  Substance and Sexual Activity   Alcohol  use: No    Alcohol /week: 0.0 standard drinks of alcohol    Drug use: No   Sexual activity: Not Currently    Partners: Male    Birth control/protection: Post-menopausal

## 2024-06-19 DIAGNOSIS — K219 Gastro-esophageal reflux disease without esophagitis: Secondary | ICD-10-CM | POA: Diagnosis not present

## 2024-06-19 DIAGNOSIS — E876 Hypokalemia: Secondary | ICD-10-CM | POA: Diagnosis not present

## 2024-06-19 DIAGNOSIS — I351 Nonrheumatic aortic (valve) insufficiency: Secondary | ICD-10-CM | POA: Diagnosis not present

## 2024-06-19 DIAGNOSIS — M19042 Primary osteoarthritis, left hand: Secondary | ICD-10-CM | POA: Diagnosis not present

## 2024-06-19 DIAGNOSIS — H9190 Unspecified hearing loss, unspecified ear: Secondary | ICD-10-CM | POA: Diagnosis not present

## 2024-06-19 DIAGNOSIS — F0393 Unspecified dementia, unspecified severity, with mood disturbance: Secondary | ICD-10-CM | POA: Diagnosis not present

## 2024-06-19 DIAGNOSIS — E871 Hypo-osmolality and hyponatremia: Secondary | ICD-10-CM | POA: Diagnosis not present

## 2024-06-19 DIAGNOSIS — Z9181 History of falling: Secondary | ICD-10-CM | POA: Diagnosis not present

## 2024-06-19 DIAGNOSIS — Z860100 Personal history of colon polyps, unspecified: Secondary | ICD-10-CM | POA: Diagnosis not present

## 2024-06-19 DIAGNOSIS — G43909 Migraine, unspecified, not intractable, without status migrainosus: Secondary | ICD-10-CM | POA: Diagnosis not present

## 2024-06-19 DIAGNOSIS — I10 Essential (primary) hypertension: Secondary | ICD-10-CM | POA: Diagnosis not present

## 2024-06-19 DIAGNOSIS — H269 Unspecified cataract: Secondary | ICD-10-CM | POA: Diagnosis not present

## 2024-06-19 DIAGNOSIS — F0394 Unspecified dementia, unspecified severity, with anxiety: Secondary | ICD-10-CM | POA: Diagnosis not present

## 2024-06-19 DIAGNOSIS — M19041 Primary osteoarthritis, right hand: Secondary | ICD-10-CM | POA: Diagnosis not present

## 2024-06-19 DIAGNOSIS — Z556 Problems related to health literacy: Secondary | ICD-10-CM | POA: Diagnosis not present

## 2024-06-19 DIAGNOSIS — E785 Hyperlipidemia, unspecified: Secondary | ICD-10-CM | POA: Diagnosis not present

## 2024-06-19 DIAGNOSIS — K529 Noninfective gastroenteritis and colitis, unspecified: Secondary | ICD-10-CM | POA: Diagnosis not present

## 2024-06-19 DIAGNOSIS — S81801D Unspecified open wound, right lower leg, subsequent encounter: Secondary | ICD-10-CM | POA: Diagnosis not present

## 2024-06-19 DIAGNOSIS — Z8701 Personal history of pneumonia (recurrent): Secondary | ICD-10-CM | POA: Diagnosis not present

## 2024-06-20 DIAGNOSIS — F0393 Unspecified dementia, unspecified severity, with mood disturbance: Secondary | ICD-10-CM | POA: Diagnosis not present

## 2024-06-20 DIAGNOSIS — G43909 Migraine, unspecified, not intractable, without status migrainosus: Secondary | ICD-10-CM | POA: Diagnosis not present

## 2024-06-20 DIAGNOSIS — H269 Unspecified cataract: Secondary | ICD-10-CM | POA: Diagnosis not present

## 2024-06-20 DIAGNOSIS — I10 Essential (primary) hypertension: Secondary | ICD-10-CM | POA: Diagnosis not present

## 2024-06-20 DIAGNOSIS — E876 Hypokalemia: Secondary | ICD-10-CM | POA: Diagnosis not present

## 2024-06-20 DIAGNOSIS — Z556 Problems related to health literacy: Secondary | ICD-10-CM | POA: Diagnosis not present

## 2024-06-20 DIAGNOSIS — E871 Hypo-osmolality and hyponatremia: Secondary | ICD-10-CM | POA: Diagnosis not present

## 2024-06-20 DIAGNOSIS — Z8701 Personal history of pneumonia (recurrent): Secondary | ICD-10-CM | POA: Diagnosis not present

## 2024-06-20 DIAGNOSIS — M19042 Primary osteoarthritis, left hand: Secondary | ICD-10-CM | POA: Diagnosis not present

## 2024-06-20 DIAGNOSIS — I351 Nonrheumatic aortic (valve) insufficiency: Secondary | ICD-10-CM | POA: Diagnosis not present

## 2024-06-20 DIAGNOSIS — S81801D Unspecified open wound, right lower leg, subsequent encounter: Secondary | ICD-10-CM | POA: Diagnosis not present

## 2024-06-20 DIAGNOSIS — F0394 Unspecified dementia, unspecified severity, with anxiety: Secondary | ICD-10-CM | POA: Diagnosis not present

## 2024-06-20 DIAGNOSIS — Z9181 History of falling: Secondary | ICD-10-CM | POA: Diagnosis not present

## 2024-06-20 DIAGNOSIS — M19041 Primary osteoarthritis, right hand: Secondary | ICD-10-CM | POA: Diagnosis not present

## 2024-06-20 DIAGNOSIS — K529 Noninfective gastroenteritis and colitis, unspecified: Secondary | ICD-10-CM | POA: Diagnosis not present

## 2024-06-20 DIAGNOSIS — H9190 Unspecified hearing loss, unspecified ear: Secondary | ICD-10-CM | POA: Diagnosis not present

## 2024-06-20 DIAGNOSIS — E785 Hyperlipidemia, unspecified: Secondary | ICD-10-CM | POA: Diagnosis not present

## 2024-06-20 DIAGNOSIS — K219 Gastro-esophageal reflux disease without esophagitis: Secondary | ICD-10-CM | POA: Diagnosis not present

## 2024-06-20 DIAGNOSIS — Z860100 Personal history of colon polyps, unspecified: Secondary | ICD-10-CM | POA: Diagnosis not present

## 2024-06-26 DIAGNOSIS — Z8701 Personal history of pneumonia (recurrent): Secondary | ICD-10-CM | POA: Diagnosis not present

## 2024-06-26 DIAGNOSIS — G43909 Migraine, unspecified, not intractable, without status migrainosus: Secondary | ICD-10-CM | POA: Diagnosis not present

## 2024-06-26 DIAGNOSIS — H269 Unspecified cataract: Secondary | ICD-10-CM | POA: Diagnosis not present

## 2024-06-26 DIAGNOSIS — F32A Depression, unspecified: Secondary | ICD-10-CM | POA: Diagnosis not present

## 2024-06-26 DIAGNOSIS — M19041 Primary osteoarthritis, right hand: Secondary | ICD-10-CM | POA: Diagnosis not present

## 2024-06-26 DIAGNOSIS — F0393 Unspecified dementia, unspecified severity, with mood disturbance: Secondary | ICD-10-CM | POA: Diagnosis not present

## 2024-06-26 DIAGNOSIS — E785 Hyperlipidemia, unspecified: Secondary | ICD-10-CM | POA: Diagnosis not present

## 2024-06-26 DIAGNOSIS — Z9181 History of falling: Secondary | ICD-10-CM | POA: Diagnosis not present

## 2024-06-26 DIAGNOSIS — Z556 Problems related to health literacy: Secondary | ICD-10-CM | POA: Diagnosis not present

## 2024-06-26 DIAGNOSIS — I10 Essential (primary) hypertension: Secondary | ICD-10-CM | POA: Diagnosis not present

## 2024-06-26 DIAGNOSIS — S81801D Unspecified open wound, right lower leg, subsequent encounter: Secondary | ICD-10-CM | POA: Diagnosis not present

## 2024-06-26 DIAGNOSIS — M19042 Primary osteoarthritis, left hand: Secondary | ICD-10-CM | POA: Diagnosis not present

## 2024-06-26 DIAGNOSIS — K219 Gastro-esophageal reflux disease without esophagitis: Secondary | ICD-10-CM | POA: Diagnosis not present

## 2024-06-26 DIAGNOSIS — F0394 Unspecified dementia, unspecified severity, with anxiety: Secondary | ICD-10-CM | POA: Diagnosis not present

## 2024-06-26 DIAGNOSIS — I351 Nonrheumatic aortic (valve) insufficiency: Secondary | ICD-10-CM | POA: Diagnosis not present

## 2024-06-26 DIAGNOSIS — G629 Polyneuropathy, unspecified: Secondary | ICD-10-CM | POA: Diagnosis not present

## 2024-06-26 DIAGNOSIS — H9190 Unspecified hearing loss, unspecified ear: Secondary | ICD-10-CM | POA: Diagnosis not present

## 2024-06-26 DIAGNOSIS — F419 Anxiety disorder, unspecified: Secondary | ICD-10-CM | POA: Diagnosis not present

## 2024-07-01 ENCOUNTER — Other Ambulatory Visit (HOSPITAL_COMMUNITY): Payer: Self-pay | Admitting: Internal Medicine

## 2024-07-01 DIAGNOSIS — M810 Age-related osteoporosis without current pathological fracture: Secondary | ICD-10-CM | POA: Insufficient documentation

## 2024-07-02 DIAGNOSIS — F0393 Unspecified dementia, unspecified severity, with mood disturbance: Secondary | ICD-10-CM | POA: Diagnosis not present

## 2024-07-02 DIAGNOSIS — S81801D Unspecified open wound, right lower leg, subsequent encounter: Secondary | ICD-10-CM | POA: Diagnosis not present

## 2024-07-02 DIAGNOSIS — K219 Gastro-esophageal reflux disease without esophagitis: Secondary | ICD-10-CM | POA: Diagnosis not present

## 2024-07-02 DIAGNOSIS — E785 Hyperlipidemia, unspecified: Secondary | ICD-10-CM | POA: Diagnosis not present

## 2024-07-02 DIAGNOSIS — F419 Anxiety disorder, unspecified: Secondary | ICD-10-CM | POA: Diagnosis not present

## 2024-07-02 DIAGNOSIS — F0394 Unspecified dementia, unspecified severity, with anxiety: Secondary | ICD-10-CM | POA: Diagnosis not present

## 2024-07-02 DIAGNOSIS — M19042 Primary osteoarthritis, left hand: Secondary | ICD-10-CM | POA: Diagnosis not present

## 2024-07-02 DIAGNOSIS — F32A Depression, unspecified: Secondary | ICD-10-CM | POA: Diagnosis not present

## 2024-07-02 DIAGNOSIS — H269 Unspecified cataract: Secondary | ICD-10-CM | POA: Diagnosis not present

## 2024-07-02 DIAGNOSIS — Z8701 Personal history of pneumonia (recurrent): Secondary | ICD-10-CM | POA: Diagnosis not present

## 2024-07-02 DIAGNOSIS — I351 Nonrheumatic aortic (valve) insufficiency: Secondary | ICD-10-CM | POA: Diagnosis not present

## 2024-07-02 DIAGNOSIS — G43909 Migraine, unspecified, not intractable, without status migrainosus: Secondary | ICD-10-CM | POA: Diagnosis not present

## 2024-07-02 DIAGNOSIS — Z9181 History of falling: Secondary | ICD-10-CM | POA: Diagnosis not present

## 2024-07-02 DIAGNOSIS — H9190 Unspecified hearing loss, unspecified ear: Secondary | ICD-10-CM | POA: Diagnosis not present

## 2024-07-02 DIAGNOSIS — I10 Essential (primary) hypertension: Secondary | ICD-10-CM | POA: Diagnosis not present

## 2024-07-02 DIAGNOSIS — Z556 Problems related to health literacy: Secondary | ICD-10-CM | POA: Diagnosis not present

## 2024-07-02 DIAGNOSIS — M19041 Primary osteoarthritis, right hand: Secondary | ICD-10-CM | POA: Diagnosis not present

## 2024-07-02 DIAGNOSIS — G629 Polyneuropathy, unspecified: Secondary | ICD-10-CM | POA: Diagnosis not present

## 2024-07-04 DIAGNOSIS — H269 Unspecified cataract: Secondary | ICD-10-CM | POA: Diagnosis not present

## 2024-07-04 DIAGNOSIS — M19041 Primary osteoarthritis, right hand: Secondary | ICD-10-CM | POA: Diagnosis not present

## 2024-07-04 DIAGNOSIS — K219 Gastro-esophageal reflux disease without esophagitis: Secondary | ICD-10-CM | POA: Diagnosis not present

## 2024-07-04 DIAGNOSIS — I10 Essential (primary) hypertension: Secondary | ICD-10-CM | POA: Diagnosis not present

## 2024-07-04 DIAGNOSIS — F419 Anxiety disorder, unspecified: Secondary | ICD-10-CM | POA: Diagnosis not present

## 2024-07-04 DIAGNOSIS — S81801D Unspecified open wound, right lower leg, subsequent encounter: Secondary | ICD-10-CM | POA: Diagnosis not present

## 2024-07-04 DIAGNOSIS — G43909 Migraine, unspecified, not intractable, without status migrainosus: Secondary | ICD-10-CM | POA: Diagnosis not present

## 2024-07-04 DIAGNOSIS — Z556 Problems related to health literacy: Secondary | ICD-10-CM | POA: Diagnosis not present

## 2024-07-04 DIAGNOSIS — E785 Hyperlipidemia, unspecified: Secondary | ICD-10-CM | POA: Diagnosis not present

## 2024-07-04 DIAGNOSIS — F0394 Unspecified dementia, unspecified severity, with anxiety: Secondary | ICD-10-CM | POA: Diagnosis not present

## 2024-07-04 DIAGNOSIS — Z9181 History of falling: Secondary | ICD-10-CM | POA: Diagnosis not present

## 2024-07-04 DIAGNOSIS — G629 Polyneuropathy, unspecified: Secondary | ICD-10-CM | POA: Diagnosis not present

## 2024-07-04 DIAGNOSIS — F0393 Unspecified dementia, unspecified severity, with mood disturbance: Secondary | ICD-10-CM | POA: Diagnosis not present

## 2024-07-04 DIAGNOSIS — M19042 Primary osteoarthritis, left hand: Secondary | ICD-10-CM | POA: Diagnosis not present

## 2024-07-04 DIAGNOSIS — F32A Depression, unspecified: Secondary | ICD-10-CM | POA: Diagnosis not present

## 2024-07-04 DIAGNOSIS — H9190 Unspecified hearing loss, unspecified ear: Secondary | ICD-10-CM | POA: Diagnosis not present

## 2024-07-04 DIAGNOSIS — Z8701 Personal history of pneumonia (recurrent): Secondary | ICD-10-CM | POA: Diagnosis not present

## 2024-07-04 DIAGNOSIS — I351 Nonrheumatic aortic (valve) insufficiency: Secondary | ICD-10-CM | POA: Diagnosis not present

## 2024-07-07 ENCOUNTER — Telehealth: Payer: Self-pay | Admitting: Orthopedic Surgery

## 2024-07-07 DIAGNOSIS — H269 Unspecified cataract: Secondary | ICD-10-CM | POA: Diagnosis not present

## 2024-07-07 DIAGNOSIS — Z9181 History of falling: Secondary | ICD-10-CM | POA: Diagnosis not present

## 2024-07-07 DIAGNOSIS — K219 Gastro-esophageal reflux disease without esophagitis: Secondary | ICD-10-CM | POA: Diagnosis not present

## 2024-07-07 DIAGNOSIS — Z8701 Personal history of pneumonia (recurrent): Secondary | ICD-10-CM | POA: Diagnosis not present

## 2024-07-07 DIAGNOSIS — H9190 Unspecified hearing loss, unspecified ear: Secondary | ICD-10-CM | POA: Diagnosis not present

## 2024-07-07 DIAGNOSIS — E785 Hyperlipidemia, unspecified: Secondary | ICD-10-CM | POA: Diagnosis not present

## 2024-07-07 DIAGNOSIS — F0394 Unspecified dementia, unspecified severity, with anxiety: Secondary | ICD-10-CM | POA: Diagnosis not present

## 2024-07-07 DIAGNOSIS — G43909 Migraine, unspecified, not intractable, without status migrainosus: Secondary | ICD-10-CM | POA: Diagnosis not present

## 2024-07-07 DIAGNOSIS — F32A Depression, unspecified: Secondary | ICD-10-CM | POA: Diagnosis not present

## 2024-07-07 DIAGNOSIS — G629 Polyneuropathy, unspecified: Secondary | ICD-10-CM | POA: Diagnosis not present

## 2024-07-07 DIAGNOSIS — Z556 Problems related to health literacy: Secondary | ICD-10-CM | POA: Diagnosis not present

## 2024-07-07 DIAGNOSIS — I351 Nonrheumatic aortic (valve) insufficiency: Secondary | ICD-10-CM | POA: Diagnosis not present

## 2024-07-07 DIAGNOSIS — F419 Anxiety disorder, unspecified: Secondary | ICD-10-CM | POA: Diagnosis not present

## 2024-07-07 DIAGNOSIS — F0393 Unspecified dementia, unspecified severity, with mood disturbance: Secondary | ICD-10-CM | POA: Diagnosis not present

## 2024-07-07 DIAGNOSIS — S81801D Unspecified open wound, right lower leg, subsequent encounter: Secondary | ICD-10-CM | POA: Diagnosis not present

## 2024-07-07 DIAGNOSIS — I10 Essential (primary) hypertension: Secondary | ICD-10-CM | POA: Diagnosis not present

## 2024-07-07 DIAGNOSIS — M19042 Primary osteoarthritis, left hand: Secondary | ICD-10-CM | POA: Diagnosis not present

## 2024-07-07 DIAGNOSIS — M19041 Primary osteoarthritis, right hand: Secondary | ICD-10-CM | POA: Diagnosis not present

## 2024-07-07 NOTE — Telephone Encounter (Signed)
 Called pt and left vm for pt to call and reschedule with Duda/ Provider out of office

## 2024-07-08 DIAGNOSIS — F419 Anxiety disorder, unspecified: Secondary | ICD-10-CM | POA: Diagnosis not present

## 2024-07-08 DIAGNOSIS — F0393 Unspecified dementia, unspecified severity, with mood disturbance: Secondary | ICD-10-CM | POA: Diagnosis not present

## 2024-07-08 DIAGNOSIS — Z9181 History of falling: Secondary | ICD-10-CM | POA: Diagnosis not present

## 2024-07-08 DIAGNOSIS — F0394 Unspecified dementia, unspecified severity, with anxiety: Secondary | ICD-10-CM | POA: Diagnosis not present

## 2024-07-08 DIAGNOSIS — K219 Gastro-esophageal reflux disease without esophagitis: Secondary | ICD-10-CM | POA: Diagnosis not present

## 2024-07-08 DIAGNOSIS — I10 Essential (primary) hypertension: Secondary | ICD-10-CM | POA: Diagnosis not present

## 2024-07-08 DIAGNOSIS — M19042 Primary osteoarthritis, left hand: Secondary | ICD-10-CM | POA: Diagnosis not present

## 2024-07-08 DIAGNOSIS — Z8701 Personal history of pneumonia (recurrent): Secondary | ICD-10-CM | POA: Diagnosis not present

## 2024-07-08 DIAGNOSIS — G629 Polyneuropathy, unspecified: Secondary | ICD-10-CM | POA: Diagnosis not present

## 2024-07-08 DIAGNOSIS — Z556 Problems related to health literacy: Secondary | ICD-10-CM | POA: Diagnosis not present

## 2024-07-08 DIAGNOSIS — H269 Unspecified cataract: Secondary | ICD-10-CM | POA: Diagnosis not present

## 2024-07-08 DIAGNOSIS — E785 Hyperlipidemia, unspecified: Secondary | ICD-10-CM | POA: Diagnosis not present

## 2024-07-08 DIAGNOSIS — M19041 Primary osteoarthritis, right hand: Secondary | ICD-10-CM | POA: Diagnosis not present

## 2024-07-08 DIAGNOSIS — G43909 Migraine, unspecified, not intractable, without status migrainosus: Secondary | ICD-10-CM | POA: Diagnosis not present

## 2024-07-08 DIAGNOSIS — H9190 Unspecified hearing loss, unspecified ear: Secondary | ICD-10-CM | POA: Diagnosis not present

## 2024-07-08 DIAGNOSIS — S81801D Unspecified open wound, right lower leg, subsequent encounter: Secondary | ICD-10-CM | POA: Diagnosis not present

## 2024-07-08 DIAGNOSIS — I351 Nonrheumatic aortic (valve) insufficiency: Secondary | ICD-10-CM | POA: Diagnosis not present

## 2024-07-08 DIAGNOSIS — F32A Depression, unspecified: Secondary | ICD-10-CM | POA: Diagnosis not present

## 2024-07-09 ENCOUNTER — Telehealth: Payer: Self-pay | Admitting: Orthopedic Surgery

## 2024-07-09 NOTE — Telephone Encounter (Signed)
 Called pt 2 x and left vm for pt to R/S with Duda. Provider will be out of office

## 2024-07-14 DIAGNOSIS — I351 Nonrheumatic aortic (valve) insufficiency: Secondary | ICD-10-CM | POA: Diagnosis not present

## 2024-07-14 DIAGNOSIS — Z556 Problems related to health literacy: Secondary | ICD-10-CM | POA: Diagnosis not present

## 2024-07-14 DIAGNOSIS — G43909 Migraine, unspecified, not intractable, without status migrainosus: Secondary | ICD-10-CM | POA: Diagnosis not present

## 2024-07-14 DIAGNOSIS — F32A Depression, unspecified: Secondary | ICD-10-CM | POA: Diagnosis not present

## 2024-07-14 DIAGNOSIS — E785 Hyperlipidemia, unspecified: Secondary | ICD-10-CM | POA: Diagnosis not present

## 2024-07-14 DIAGNOSIS — M19042 Primary osteoarthritis, left hand: Secondary | ICD-10-CM | POA: Diagnosis not present

## 2024-07-14 DIAGNOSIS — M19041 Primary osteoarthritis, right hand: Secondary | ICD-10-CM | POA: Diagnosis not present

## 2024-07-14 DIAGNOSIS — G629 Polyneuropathy, unspecified: Secondary | ICD-10-CM | POA: Diagnosis not present

## 2024-07-14 DIAGNOSIS — F419 Anxiety disorder, unspecified: Secondary | ICD-10-CM | POA: Diagnosis not present

## 2024-07-14 DIAGNOSIS — Z8701 Personal history of pneumonia (recurrent): Secondary | ICD-10-CM | POA: Diagnosis not present

## 2024-07-14 DIAGNOSIS — I10 Essential (primary) hypertension: Secondary | ICD-10-CM | POA: Diagnosis not present

## 2024-07-14 DIAGNOSIS — S81801D Unspecified open wound, right lower leg, subsequent encounter: Secondary | ICD-10-CM | POA: Diagnosis not present

## 2024-07-14 DIAGNOSIS — Z9181 History of falling: Secondary | ICD-10-CM | POA: Diagnosis not present

## 2024-07-14 DIAGNOSIS — H9190 Unspecified hearing loss, unspecified ear: Secondary | ICD-10-CM | POA: Diagnosis not present

## 2024-07-14 DIAGNOSIS — F0394 Unspecified dementia, unspecified severity, with anxiety: Secondary | ICD-10-CM | POA: Diagnosis not present

## 2024-07-14 DIAGNOSIS — H269 Unspecified cataract: Secondary | ICD-10-CM | POA: Diagnosis not present

## 2024-07-14 DIAGNOSIS — K219 Gastro-esophageal reflux disease without esophagitis: Secondary | ICD-10-CM | POA: Diagnosis not present

## 2024-07-14 DIAGNOSIS — F0393 Unspecified dementia, unspecified severity, with mood disturbance: Secondary | ICD-10-CM | POA: Diagnosis not present

## 2024-07-17 ENCOUNTER — Ambulatory Visit: Admitting: Physician Assistant

## 2024-07-22 ENCOUNTER — Ambulatory Visit (HOSPITAL_COMMUNITY)
Admission: RE | Admit: 2024-07-22 | Discharge: 2024-07-22 | Disposition: A | Discharge status: Home / Self Care | Source: Ambulatory Visit | Attending: Internal Medicine | Admitting: Internal Medicine

## 2024-07-22 VITALS — BP 145/61 | HR 54 | Temp 97.0°F | Resp 17

## 2024-07-22 DIAGNOSIS — M810 Age-related osteoporosis without current pathological fracture: Secondary | ICD-10-CM | POA: Diagnosis not present

## 2024-07-22 MED ORDER — DENOSUMAB 60 MG/ML ~~LOC~~ SOSY
PREFILLED_SYRINGE | SUBCUTANEOUS | Status: AC
  Filled 2024-07-22: qty 1

## 2024-07-22 MED ORDER — DENOSUMAB 60 MG/ML ~~LOC~~ SOSY
60.0000 mg | PREFILLED_SYRINGE | Freq: Once | SUBCUTANEOUS | Status: AC
  Administered 2024-07-22: 60 mg via SUBCUTANEOUS

## 2024-07-28 DIAGNOSIS — H269 Unspecified cataract: Secondary | ICD-10-CM | POA: Diagnosis not present

## 2024-07-28 DIAGNOSIS — G43909 Migraine, unspecified, not intractable, without status migrainosus: Secondary | ICD-10-CM | POA: Diagnosis not present

## 2024-07-28 DIAGNOSIS — S81801D Unspecified open wound, right lower leg, subsequent encounter: Secondary | ICD-10-CM | POA: Diagnosis not present

## 2024-07-28 DIAGNOSIS — I10 Essential (primary) hypertension: Secondary | ICD-10-CM | POA: Diagnosis not present

## 2024-07-28 DIAGNOSIS — F0393 Unspecified dementia, unspecified severity, with mood disturbance: Secondary | ICD-10-CM | POA: Diagnosis not present

## 2024-07-28 DIAGNOSIS — M19041 Primary osteoarthritis, right hand: Secondary | ICD-10-CM | POA: Diagnosis not present

## 2024-07-28 DIAGNOSIS — I351 Nonrheumatic aortic (valve) insufficiency: Secondary | ICD-10-CM | POA: Diagnosis not present

## 2024-07-28 DIAGNOSIS — E785 Hyperlipidemia, unspecified: Secondary | ICD-10-CM | POA: Diagnosis not present

## 2024-07-28 DIAGNOSIS — F0394 Unspecified dementia, unspecified severity, with anxiety: Secondary | ICD-10-CM | POA: Diagnosis not present

## 2024-07-28 DIAGNOSIS — F32A Depression, unspecified: Secondary | ICD-10-CM | POA: Diagnosis not present

## 2024-07-28 DIAGNOSIS — H9190 Unspecified hearing loss, unspecified ear: Secondary | ICD-10-CM | POA: Diagnosis not present

## 2024-07-28 DIAGNOSIS — Z9181 History of falling: Secondary | ICD-10-CM | POA: Diagnosis not present

## 2024-07-28 DIAGNOSIS — F419 Anxiety disorder, unspecified: Secondary | ICD-10-CM | POA: Diagnosis not present

## 2024-07-28 DIAGNOSIS — M19042 Primary osteoarthritis, left hand: Secondary | ICD-10-CM | POA: Diagnosis not present

## 2024-07-28 DIAGNOSIS — K219 Gastro-esophageal reflux disease without esophagitis: Secondary | ICD-10-CM | POA: Diagnosis not present

## 2024-07-28 DIAGNOSIS — Z8701 Personal history of pneumonia (recurrent): Secondary | ICD-10-CM | POA: Diagnosis not present

## 2024-07-28 DIAGNOSIS — Z556 Problems related to health literacy: Secondary | ICD-10-CM | POA: Diagnosis not present

## 2024-07-28 DIAGNOSIS — G629 Polyneuropathy, unspecified: Secondary | ICD-10-CM | POA: Diagnosis not present

## 2024-08-06 DIAGNOSIS — F32A Depression, unspecified: Secondary | ICD-10-CM | POA: Diagnosis not present

## 2024-08-06 DIAGNOSIS — K219 Gastro-esophageal reflux disease without esophagitis: Secondary | ICD-10-CM | POA: Diagnosis not present

## 2024-08-06 DIAGNOSIS — G629 Polyneuropathy, unspecified: Secondary | ICD-10-CM | POA: Diagnosis not present

## 2024-08-06 DIAGNOSIS — I10 Essential (primary) hypertension: Secondary | ICD-10-CM | POA: Diagnosis not present

## 2024-08-06 DIAGNOSIS — F419 Anxiety disorder, unspecified: Secondary | ICD-10-CM | POA: Diagnosis not present

## 2024-08-06 DIAGNOSIS — Z8701 Personal history of pneumonia (recurrent): Secondary | ICD-10-CM | POA: Diagnosis not present

## 2024-08-06 DIAGNOSIS — E785 Hyperlipidemia, unspecified: Secondary | ICD-10-CM | POA: Diagnosis not present

## 2024-08-06 DIAGNOSIS — F0393 Unspecified dementia, unspecified severity, with mood disturbance: Secondary | ICD-10-CM | POA: Diagnosis not present

## 2024-08-06 DIAGNOSIS — Z556 Problems related to health literacy: Secondary | ICD-10-CM | POA: Diagnosis not present

## 2024-08-06 DIAGNOSIS — F0394 Unspecified dementia, unspecified severity, with anxiety: Secondary | ICD-10-CM | POA: Diagnosis not present

## 2024-08-06 DIAGNOSIS — I351 Nonrheumatic aortic (valve) insufficiency: Secondary | ICD-10-CM | POA: Diagnosis not present

## 2024-08-06 DIAGNOSIS — G43909 Migraine, unspecified, not intractable, without status migrainosus: Secondary | ICD-10-CM | POA: Diagnosis not present

## 2024-08-06 DIAGNOSIS — S81801D Unspecified open wound, right lower leg, subsequent encounter: Secondary | ICD-10-CM | POA: Diagnosis not present

## 2024-08-06 DIAGNOSIS — H9190 Unspecified hearing loss, unspecified ear: Secondary | ICD-10-CM | POA: Diagnosis not present

## 2024-08-06 DIAGNOSIS — H269 Unspecified cataract: Secondary | ICD-10-CM | POA: Diagnosis not present

## 2024-08-06 DIAGNOSIS — Z9181 History of falling: Secondary | ICD-10-CM | POA: Diagnosis not present

## 2024-08-06 DIAGNOSIS — M19042 Primary osteoarthritis, left hand: Secondary | ICD-10-CM | POA: Diagnosis not present

## 2024-08-06 DIAGNOSIS — M19041 Primary osteoarthritis, right hand: Secondary | ICD-10-CM | POA: Diagnosis not present

## 2024-08-11 DIAGNOSIS — M19042 Primary osteoarthritis, left hand: Secondary | ICD-10-CM | POA: Diagnosis not present

## 2024-08-11 DIAGNOSIS — G43909 Migraine, unspecified, not intractable, without status migrainosus: Secondary | ICD-10-CM | POA: Diagnosis not present

## 2024-08-11 DIAGNOSIS — Z8701 Personal history of pneumonia (recurrent): Secondary | ICD-10-CM | POA: Diagnosis not present

## 2024-08-11 DIAGNOSIS — F32A Depression, unspecified: Secondary | ICD-10-CM | POA: Diagnosis not present

## 2024-08-11 DIAGNOSIS — M19041 Primary osteoarthritis, right hand: Secondary | ICD-10-CM | POA: Diagnosis not present

## 2024-08-11 DIAGNOSIS — Z9181 History of falling: Secondary | ICD-10-CM | POA: Diagnosis not present

## 2024-08-11 DIAGNOSIS — S81801D Unspecified open wound, right lower leg, subsequent encounter: Secondary | ICD-10-CM | POA: Diagnosis not present

## 2024-08-11 DIAGNOSIS — E785 Hyperlipidemia, unspecified: Secondary | ICD-10-CM | POA: Diagnosis not present

## 2024-08-11 DIAGNOSIS — F419 Anxiety disorder, unspecified: Secondary | ICD-10-CM | POA: Diagnosis not present

## 2024-08-11 DIAGNOSIS — F0394 Unspecified dementia, unspecified severity, with anxiety: Secondary | ICD-10-CM | POA: Diagnosis not present

## 2024-08-11 DIAGNOSIS — I351 Nonrheumatic aortic (valve) insufficiency: Secondary | ICD-10-CM | POA: Diagnosis not present

## 2024-08-11 DIAGNOSIS — Z556 Problems related to health literacy: Secondary | ICD-10-CM | POA: Diagnosis not present

## 2024-08-11 DIAGNOSIS — K219 Gastro-esophageal reflux disease without esophagitis: Secondary | ICD-10-CM | POA: Diagnosis not present

## 2024-08-11 DIAGNOSIS — F0393 Unspecified dementia, unspecified severity, with mood disturbance: Secondary | ICD-10-CM | POA: Diagnosis not present

## 2024-08-11 DIAGNOSIS — H269 Unspecified cataract: Secondary | ICD-10-CM | POA: Diagnosis not present

## 2024-08-11 DIAGNOSIS — H9190 Unspecified hearing loss, unspecified ear: Secondary | ICD-10-CM | POA: Diagnosis not present

## 2024-08-11 DIAGNOSIS — G629 Polyneuropathy, unspecified: Secondary | ICD-10-CM | POA: Diagnosis not present

## 2024-08-11 DIAGNOSIS — I10 Essential (primary) hypertension: Secondary | ICD-10-CM | POA: Diagnosis not present

## 2024-08-20 DIAGNOSIS — F32A Depression, unspecified: Secondary | ICD-10-CM | POA: Diagnosis not present

## 2024-08-20 DIAGNOSIS — G629 Polyneuropathy, unspecified: Secondary | ICD-10-CM | POA: Diagnosis not present

## 2024-08-20 DIAGNOSIS — E785 Hyperlipidemia, unspecified: Secondary | ICD-10-CM | POA: Diagnosis not present

## 2024-08-20 DIAGNOSIS — K219 Gastro-esophageal reflux disease without esophagitis: Secondary | ICD-10-CM | POA: Diagnosis not present

## 2024-08-20 DIAGNOSIS — Z8701 Personal history of pneumonia (recurrent): Secondary | ICD-10-CM | POA: Diagnosis not present

## 2024-08-20 DIAGNOSIS — Z9181 History of falling: Secondary | ICD-10-CM | POA: Diagnosis not present

## 2024-08-20 DIAGNOSIS — I10 Essential (primary) hypertension: Secondary | ICD-10-CM | POA: Diagnosis not present

## 2024-08-20 DIAGNOSIS — H9190 Unspecified hearing loss, unspecified ear: Secondary | ICD-10-CM | POA: Diagnosis not present

## 2024-08-20 DIAGNOSIS — S81801D Unspecified open wound, right lower leg, subsequent encounter: Secondary | ICD-10-CM | POA: Diagnosis not present

## 2024-08-20 DIAGNOSIS — I351 Nonrheumatic aortic (valve) insufficiency: Secondary | ICD-10-CM | POA: Diagnosis not present

## 2024-08-20 DIAGNOSIS — Z556 Problems related to health literacy: Secondary | ICD-10-CM | POA: Diagnosis not present

## 2024-08-20 DIAGNOSIS — G43909 Migraine, unspecified, not intractable, without status migrainosus: Secondary | ICD-10-CM | POA: Diagnosis not present

## 2024-08-20 DIAGNOSIS — M19042 Primary osteoarthritis, left hand: Secondary | ICD-10-CM | POA: Diagnosis not present

## 2024-08-20 DIAGNOSIS — H269 Unspecified cataract: Secondary | ICD-10-CM | POA: Diagnosis not present

## 2024-08-20 DIAGNOSIS — F419 Anxiety disorder, unspecified: Secondary | ICD-10-CM | POA: Diagnosis not present

## 2024-08-20 DIAGNOSIS — M19041 Primary osteoarthritis, right hand: Secondary | ICD-10-CM | POA: Diagnosis not present

## 2024-08-20 DIAGNOSIS — F0394 Unspecified dementia, unspecified severity, with anxiety: Secondary | ICD-10-CM | POA: Diagnosis not present

## 2024-08-20 DIAGNOSIS — F0393 Unspecified dementia, unspecified severity, with mood disturbance: Secondary | ICD-10-CM | POA: Diagnosis not present

## 2024-09-11 DIAGNOSIS — G309 Alzheimer's disease, unspecified: Secondary | ICD-10-CM | POA: Diagnosis not present

## 2024-09-11 DIAGNOSIS — Z23 Encounter for immunization: Secondary | ICD-10-CM | POA: Diagnosis not present

## 2024-09-11 DIAGNOSIS — E785 Hyperlipidemia, unspecified: Secondary | ICD-10-CM | POA: Diagnosis not present

## 2024-09-11 DIAGNOSIS — I10 Essential (primary) hypertension: Secondary | ICD-10-CM | POA: Diagnosis not present

## 2024-09-11 DIAGNOSIS — D649 Anemia, unspecified: Secondary | ICD-10-CM | POA: Diagnosis not present

## 2024-09-11 DIAGNOSIS — R7301 Impaired fasting glucose: Secondary | ICD-10-CM | POA: Diagnosis not present

## 2025-01-20 ENCOUNTER — Encounter (HOSPITAL_COMMUNITY)
